# Patient Record
Sex: Male | Born: 1945
Health system: Southern US, Community
[De-identification: ages and names within clinical notes are randomized; demographics above are authoritative.]

## PROBLEM LIST (undated history)

## (undated) DIAGNOSIS — E785 Hyperlipidemia, unspecified: Secondary | ICD-10-CM

## (undated) DIAGNOSIS — I10 Essential (primary) hypertension: Secondary | ICD-10-CM

## (undated) DIAGNOSIS — R0602 Shortness of breath: Secondary | ICD-10-CM

## (undated) DIAGNOSIS — I5042 Chronic combined systolic (congestive) and diastolic (congestive) heart failure: Secondary | ICD-10-CM

## (undated) HISTORY — PX: DENTAL SURGERY: SHX609

---

## 2006-07-17 ENCOUNTER — Emergency Department (HOSPITAL_COMMUNITY): Admission: EM | Admit: 2006-07-17 | Discharge: 2006-07-17 | Payer: Self-pay | Admitting: Emergency Medicine

## 2010-05-21 ENCOUNTER — Ambulatory Visit (HOSPITAL_COMMUNITY): Admission: RE | Admit: 2010-05-21 | Discharge: 2010-05-21 | Payer: Self-pay | Admitting: Cardiology

## 2013-06-15 ENCOUNTER — Observation Stay (HOSPITAL_COMMUNITY)
Admission: EM | Admit: 2013-06-15 | Discharge: 2013-06-17 | Disposition: A | Discharge status: Home / Self Care | Payer: Medicare Other | Attending: Internal Medicine | Admitting: Internal Medicine

## 2013-06-15 ENCOUNTER — Emergency Department (HOSPITAL_COMMUNITY): Payer: Medicare Other

## 2013-06-15 ENCOUNTER — Encounter (HOSPITAL_COMMUNITY): Payer: Self-pay | Admitting: Emergency Medicine

## 2013-06-15 DIAGNOSIS — I5041 Acute combined systolic (congestive) and diastolic (congestive) heart failure: Secondary | ICD-10-CM

## 2013-06-15 DIAGNOSIS — N183 Chronic kidney disease, stage 3 unspecified: Secondary | ICD-10-CM | POA: Insufficient documentation

## 2013-06-15 DIAGNOSIS — R0602 Shortness of breath: Secondary | ICD-10-CM | POA: Diagnosis not present

## 2013-06-15 DIAGNOSIS — I5043 Acute on chronic combined systolic (congestive) and diastolic (congestive) heart failure: Secondary | ICD-10-CM | POA: Diagnosis not present

## 2013-06-15 DIAGNOSIS — I161 Hypertensive emergency: Secondary | ICD-10-CM

## 2013-06-15 DIAGNOSIS — D649 Anemia, unspecified: Secondary | ICD-10-CM | POA: Diagnosis not present

## 2013-06-15 DIAGNOSIS — N179 Acute kidney failure, unspecified: Secondary | ICD-10-CM | POA: Diagnosis not present

## 2013-06-15 DIAGNOSIS — I10 Essential (primary) hypertension: Secondary | ICD-10-CM | POA: Diagnosis not present

## 2013-06-15 DIAGNOSIS — I130 Hypertensive heart and chronic kidney disease with heart failure and stage 1 through stage 4 chronic kidney disease, or unspecified chronic kidney disease: Principal | ICD-10-CM | POA: Insufficient documentation

## 2013-06-15 DIAGNOSIS — I5042 Chronic combined systolic (congestive) and diastolic (congestive) heart failure: Secondary | ICD-10-CM

## 2013-06-15 DIAGNOSIS — J449 Chronic obstructive pulmonary disease, unspecified: Secondary | ICD-10-CM | POA: Diagnosis not present

## 2013-06-15 DIAGNOSIS — Z125 Encounter for screening for malignant neoplasm of prostate: Secondary | ICD-10-CM | POA: Diagnosis not present

## 2013-06-15 DIAGNOSIS — R079 Chest pain, unspecified: Secondary | ICD-10-CM

## 2013-06-15 DIAGNOSIS — Z9119 Patient's noncompliance with other medical treatment and regimen: Secondary | ICD-10-CM | POA: Insufficient documentation

## 2013-06-15 DIAGNOSIS — E785 Hyperlipidemia, unspecified: Secondary | ICD-10-CM | POA: Insufficient documentation

## 2013-06-15 DIAGNOSIS — R634 Abnormal weight loss: Secondary | ICD-10-CM | POA: Diagnosis not present

## 2013-06-15 DIAGNOSIS — F17200 Nicotine dependence, unspecified, uncomplicated: Secondary | ICD-10-CM | POA: Insufficient documentation

## 2013-06-15 DIAGNOSIS — I509 Heart failure, unspecified: Secondary | ICD-10-CM | POA: Insufficient documentation

## 2013-06-15 DIAGNOSIS — E782 Mixed hyperlipidemia: Secondary | ICD-10-CM | POA: Diagnosis not present

## 2013-06-15 DIAGNOSIS — Z79899 Other long term (current) drug therapy: Secondary | ICD-10-CM | POA: Diagnosis not present

## 2013-06-15 DIAGNOSIS — Z91199 Patient's noncompliance with other medical treatment and regimen due to unspecified reason: Secondary | ICD-10-CM | POA: Diagnosis not present

## 2013-06-15 DIAGNOSIS — R9431 Abnormal electrocardiogram [ECG] [EKG]: Secondary | ICD-10-CM | POA: Diagnosis present

## 2013-06-15 HISTORY — DX: Essential (primary) hypertension: I10

## 2013-06-15 HISTORY — DX: Shortness of breath: R06.02

## 2013-06-15 HISTORY — DX: Hyperlipidemia, unspecified: E78.5

## 2013-06-15 LAB — BASIC METABOLIC PANEL
BUN: 21 mg/dL (ref 6–23)
CO2: 23 mEq/L (ref 19–32)
Calcium: 9.6 mg/dL (ref 8.4–10.5)
Glucose, Bld: 87 mg/dL (ref 70–99)
Sodium: 138 mEq/L (ref 135–145)

## 2013-06-15 LAB — CBC WITH DIFFERENTIAL/PLATELET
Eosinophils Relative: 1 % (ref 0–5)
HCT: 33.7 % — ABNORMAL LOW (ref 39.0–52.0)
Hemoglobin: 11.5 g/dL — ABNORMAL LOW (ref 13.0–17.0)
Lymphocytes Relative: 30 % (ref 12–46)
Lymphs Abs: 2.2 10*3/uL (ref 0.7–4.0)
MCH: 29.6 pg (ref 26.0–34.0)
MCV: 86.9 fL (ref 78.0–100.0)
Monocytes Absolute: 0.6 10*3/uL (ref 0.1–1.0)
Monocytes Relative: 8 % (ref 3–12)
Platelets: 300 10*3/uL (ref 150–400)
RBC: 3.88 MIL/uL — ABNORMAL LOW (ref 4.22–5.81)
WBC: 7.5 10*3/uL (ref 4.0–10.5)

## 2013-06-15 LAB — URINALYSIS, ROUTINE W REFLEX MICROSCOPIC
Bilirubin Urine: NEGATIVE
Glucose, UA: NEGATIVE mg/dL
Leukocytes, UA: NEGATIVE
Nitrite: NEGATIVE
Specific Gravity, Urine: 1.014 (ref 1.005–1.030)
pH: 5.5 (ref 5.0–8.0)

## 2013-06-15 LAB — POCT I-STAT TROPONIN I

## 2013-06-15 LAB — URINE MICROSCOPIC-ADD ON

## 2013-06-15 MED ORDER — ALBUTEROL SULFATE HFA 108 (90 BASE) MCG/ACT IN AERS
2.0000 | INHALATION_SPRAY | Freq: Two times a day (BID) | RESPIRATORY_TRACT | Status: DC | PRN
  Filled 2013-06-15: qty 6.7

## 2013-06-15 MED ORDER — HEPARIN SODIUM (PORCINE) 5000 UNIT/ML IJ SOLN
5000.0000 [IU] | Freq: Three times a day (TID) | INTRAMUSCULAR | Status: DC
  Administered 2013-06-16 – 2013-06-17 (×5): 5000 [IU] via SUBCUTANEOUS
  Filled 2013-06-15 (×8): qty 1

## 2013-06-15 MED ORDER — ALBUTEROL SULFATE HFA 108 (90 BASE) MCG/ACT IN AERS
2.0000 | INHALATION_SPRAY | Freq: Four times a day (QID) | RESPIRATORY_TRACT | Status: DC
  Administered 2013-06-16: 01:00:00 2 via RESPIRATORY_TRACT
  Filled 2013-06-15: qty 6.7

## 2013-06-15 MED ORDER — ASPIRIN 81 MG PO CHEW
324.0000 mg | CHEWABLE_TABLET | Freq: Once | ORAL | Status: AC
  Administered 2013-06-15: 324 mg via ORAL
  Filled 2013-06-15: qty 4

## 2013-06-15 MED ORDER — METOPROLOL TARTRATE 50 MG PO TABS
50.0000 mg | ORAL_TABLET | Freq: Two times a day (BID) | ORAL | Status: DC
  Administered 2013-06-15 – 2013-06-17 (×4): 50 mg via ORAL
  Filled 2013-06-15 (×2): qty 1
  Filled 2013-06-15: qty 2
  Filled 2013-06-15 (×2): qty 1

## 2013-06-15 MED ORDER — HYDROCODONE-ACETAMINOPHEN 5-325 MG PO TABS: 1.0000 | ORAL_TABLET | ORAL | Status: DC | PRN

## 2013-06-15 MED ORDER — ACETAMINOPHEN 650 MG RE SUPP: 650.0000 mg | Freq: Four times a day (QID) | RECTAL | Status: DC | PRN

## 2013-06-15 MED ORDER — ONDANSETRON HCL 4 MG/2ML IJ SOLN: 4.0000 mg | Freq: Three times a day (TID) | INTRAMUSCULAR | Status: AC | PRN

## 2013-06-15 MED ORDER — LABETALOL HCL 5 MG/ML IV SOLN
20.0000 mg | Freq: Once | INTRAVENOUS | Status: AC
  Administered 2013-06-15: 20 mg via INTRAVENOUS
  Filled 2013-06-15: qty 4

## 2013-06-15 MED ORDER — ACETAMINOPHEN 325 MG PO TABS: 650.0000 mg | ORAL_TABLET | Freq: Four times a day (QID) | ORAL | Status: DC | PRN

## 2013-06-15 MED ORDER — HYDROCHLOROTHIAZIDE 25 MG PO TABS
25.0000 mg | ORAL_TABLET | Freq: Every day | ORAL | Status: DC
  Administered 2013-06-16: 11:00:00 25 mg via ORAL
  Filled 2013-06-15: qty 1

## 2013-06-15 MED ORDER — SODIUM CHLORIDE 0.9 % IJ SOLN
3.0000 mL | Freq: Two times a day (BID) | INTRAMUSCULAR | Status: DC
  Administered 2013-06-16 – 2013-06-17 (×4): 3 mL via INTRAVENOUS

## 2013-06-15 MED ORDER — ASPIRIN EC 81 MG PO TBEC
81.0000 mg | DELAYED_RELEASE_TABLET | Freq: Every day | ORAL | Status: DC
  Administered 2013-06-16 – 2013-06-17 (×2): 81 mg via ORAL
  Filled 2013-06-15 (×2): qty 1

## 2013-06-15 MED ORDER — HYDRALAZINE HCL 20 MG/ML IJ SOLN
10.0000 mg | INTRAMUSCULAR | Status: DC | PRN
  Administered 2013-06-15: 10 mg via INTRAVENOUS
  Filled 2013-06-15: qty 1

## 2013-06-15 MED ORDER — AMLODIPINE BESYLATE 2.5 MG PO TABS
2.5000 mg | ORAL_TABLET | Freq: Once | ORAL | Status: AC
  Administered 2013-06-16: 2.5 mg via ORAL
  Filled 2013-06-15: qty 1

## 2013-06-15 NOTE — Progress Notes (Signed)
Admitted pt from ED per stretcher, alert,oriented x 4, denies any chest pain, no headache, no nausea and vomiting, not in respiratory distress. VS taken and weighed. Put on telemetry. Oriented to room and call bell. Will continue to monitor pt

## 2013-06-15 NOTE — ED Notes (Signed)
Attempted report 

## 2013-06-15 NOTE — ED Notes (Signed)
Pt asks not to have blood work at this time because he had labs earlier today

## 2013-06-15 NOTE — ED Provider Notes (Addendum)
TIME SEEN: 6:20 PM  CHIEF COMPLAINT: Hypertension, chest pain, shortness of breath  HPI: Patient is a 67 year old male with a prior history of hypertension, hyperlipidemia, tobacco use who presents the emergency department for evaluation from his primary care physician's office for complaints of intermittent chest pain and shortness of breath as well as uncontrolled hypertension. Patient reports that he went to Dr. Joelene Millin Shelton's office [facility] for annual checkup. There he was extremely hypertensive and was sent to the emergency department. On review of systems, patient complains that he has had intermittent chest heaviness and shortness of breath with exertion. His last stress was many years ago. He denies a history of cardiac catheterization. No history of PE or DVT. He describes some dizziness and nausea with his episodes of chest pain but no diaphoresis. No fever or cough. He currently denies any chest pain but is having mild shortness of breath. No headache, blurry vision, numbness, tingling or focal weakness. He reports that he has been off of his blood pressure medication for 2 years because he started feeling well while he was on him and thought he did not need them anymore.  ROS: See HPI Constitutional: no fever  Eyes: no drainage  ENT: no runny nose   Cardiovascular:  chest pain  Resp: SOB  GI: no vomiting GU: no dysuria Integumentary: no rash  Allergy: no hives  Musculoskeletal: no leg swelling  Neurological: no slurred speech ROS otherwise negative  PAST MEDICAL HISTORY/PAST SURGICAL HISTORY:  Past Medical History  Diagnosis Date  . Hypertension     MEDICATIONS:  Prior to Admission medications   Medication Sig Start Date End Date Taking? Authorizing Provider  albuterol (PROVENTIL HFA;VENTOLIN HFA) 108 (90 BASE) MCG/ACT inhaler Inhale 2 puffs into the lungs 2 (two) times daily as needed for wheezing or shortness of breath.   Yes Historical  Provider, MD  hydrocortisone cream 1 % Apply 1 application topically daily. Apply to spots on face   Yes Historical Provider, MD  ibuprofen (ADVIL,MOTRIN) 200 MG tablet Take 200-400 mg by mouth daily as needed for pain.   Yes Historical Provider, MD    ALLERGIES:  No Known Allergies  SOCIAL HISTORY:  History  Substance Use Topics  . Smoking status: Current Every Day Smoker  . Smokeless tobacco: Not on file  . Alcohol Use: No    FAMILY HISTORY: History reviewed. No pertinent family history.  EXAM: BP 213/117  Pulse 105  Temp(Src) 98.2 F (36.8 C) (Oral)  Resp 16  SpO2 98% CONSTITUTIONAL: Alert and oriented and responds appropriately to questions. Well-appearing; well-nourished, no apparent distress, hypertensive HEAD: Normocephalic EYES: Conjunctivae clear, PERRL ENT: normal nose; no rhinorrhea; moist mucous membranes; pharynx without lesions noted NECK: Supple, no meningismus, no LAD  CARD: RRR; S1 and S2 appreciated; no murmurs, no clicks, no rubs, no gallops RESP: Normal chest excursion without splinting or tachypnea; breath sounds clear and equal bilaterally; no wheezes, no rhonchi, no rales,  ABD/GI: Normal bowel sounds; non-distended; soft, non-tender, no rebound, no guarding BACK:  The back appears normal and is non-tender to palpation, there is no CVA tenderness EXT: Normal ROM in all joints; non-tender to palpation; no edema; normal capillary refill; no cyanosis    SKIN: Normal color for age and race; warm NEURO: Moves all extremities equally, cranial nerves II through XII intact, sensation to light touch intact diffusely, normal gait, no pronator dressed PSYCH: The patient's mood and manner are appropriate. Grooming and personal hygiene are appropriate.  MEDICAL DECISION MAKING: Patient with severe hypertension, chest pain and shortness of breath. We'll thyroid for hypertensive emergency. Will give antihypertensives in the emergency department. We'll also give aspirin  given his history of chest pain. He is currently chest pain-free. We'll obtain cardiac labs, chest x-ray, urinalysis. Have recommended admission to the hospital.   Patient reports that he really does not want admission to the hospital. He states he is only here because his primary care physician instructed him to come to the emergency department for blood pressure control.   Date: 06/15/2013 8:22 PM  Rate: 85  Rhythm: normal sinus rhythm  QRS Axis: normal  Intervals: normal  ST/T Wave abnormalities: ST depression and T wave inversion in inferior and lateral leads  Conduction Disutrbances: none  Narrative Interpretation: Left atrial enlargement, LVH, ST depression and T-wave inversions in inferior and lateral leads, no old for comparison     ED PROGRESS:  Patient's blood pressure has improved with IV labetalol. Will start on oral metoprolol. His EKG is concerning for ischemic changes versus changes due to prolonged hypertension. Labs pending.  Troponin negative. Creatinine is slightly elevated at 1.32. Chest x-ray clear. Urinalysis pending.  Patient now agrees to being admitted to the hospital. Will discuss with hospitalist.  Blood pressures improved to 170s/100s. Do not want to acutely lower patient any further given concern for cerebral ischemia.  9:08PM  Discussed with Dr. Posey Pronto for admission.  BP improved and stable upon multiple re-evals.  Do not feel pt needs drip at this time.  Given oral meds after IV to control his BP.  [place], DO 06/15/13 2109  CRITICAL CARE Performed by: Nyra Jabs   Total critical care time: 60 minutes  Critical care time was exclusive of separately billable procedures and treating other patients.  Critical care was necessary to treat or prevent imminent or life-threatening deterioration.  Critical care was time spent personally by me on the following activities: development of treatment plan with patient and/or surrogate as well as nursing,  discussions with consultants, evaluation of patient's response to treatment, examination of patient, obtaining history from patient or surrogate, ordering and performing treatments and interventions, ordering and review of laboratory studies, ordering and review of radiographic studies, pulse oximetry and re-evaluation of patient's condition.  [place], DO 06/15/13 2333

## 2013-06-15 NOTE — ED Notes (Signed)
Delay expressed to 4E RN

## 2013-06-15 NOTE — ED Notes (Signed)
Pt sent here by PCP for htn; pt sts some exertional SOB; pt denies pain; pt sts hx of htn and not taking meds x 2 years

## 2013-06-16 DIAGNOSIS — I059 Rheumatic mitral valve disease, unspecified: Secondary | ICD-10-CM | POA: Diagnosis not present

## 2013-06-16 DIAGNOSIS — R079 Chest pain, unspecified: Secondary | ICD-10-CM | POA: Diagnosis not present

## 2013-06-16 DIAGNOSIS — R9431 Abnormal electrocardiogram [ECG] [EKG]: Secondary | ICD-10-CM | POA: Diagnosis present

## 2013-06-16 DIAGNOSIS — I10 Essential (primary) hypertension: Secondary | ICD-10-CM | POA: Diagnosis present

## 2013-06-16 DIAGNOSIS — I130 Hypertensive heart and chronic kidney disease with heart failure and stage 1 through stage 4 chronic kidney disease, or unspecified chronic kidney disease: Secondary | ICD-10-CM | POA: Diagnosis not present

## 2013-06-16 DIAGNOSIS — I5042 Chronic combined systolic (congestive) and diastolic (congestive) heart failure: Secondary | ICD-10-CM

## 2013-06-16 LAB — TROPONIN I
Troponin I: <0.3 ng/mL (ref <0.30)
Troponin I: <0.3 ng/mL (ref <0.30)
Troponin I: <0.3 ng/mL (ref <0.30)

## 2013-06-16 LAB — LIPID PANEL
HDL: 37 mg/dL — ABNORMAL LOW (ref >39)
LDL Cholesterol: 150 mg/dL — ABNORMAL HIGH (ref 0–99)
VLDL: 14 mg/dL (ref 0–40)

## 2013-06-16 LAB — CBC WITH DIFFERENTIAL/PLATELET
Basophils Absolute: 0 10*3/uL (ref 0.0–0.1)
HCT: 32.1 % — ABNORMAL LOW (ref 39.0–52.0)
Lymphocytes Relative: 42 % (ref 12–46)
Neutro Abs: 3.2 10*3/uL (ref 1.7–7.7)
Neutrophils Relative %: 46 % (ref 43–77)
Platelets: 307 10*3/uL (ref 150–400)
RDW: 14 % (ref 11.5–15.5)
WBC: 7 10*3/uL (ref 4.0–10.5)

## 2013-06-16 LAB — COMPREHENSIVE METABOLIC PANEL
ALT: 9 U/L (ref 0–53)
AST: 28 U/L (ref 0–37)
Albumin: 3.5 g/dL (ref 3.5–5.2)
CO2: 27 mEq/L (ref 19–32)
Chloride: 103 mEq/L (ref 96–112)
GFR calc non Af Amer: 49 mL/min — ABNORMAL LOW (ref >90)
Potassium: 4.4 mEq/L (ref 3.5–5.1)
Sodium: 141 mEq/L (ref 135–145)
Total Bilirubin: 0.2 mg/dL — ABNORMAL LOW (ref 0.3–1.2)

## 2013-06-16 LAB — HEMOGLOBIN A1C: Hgb A1c MFr Bld: 5.1 % (ref <5.7)

## 2013-06-16 LAB — GLUCOSE, CAPILLARY: Glucose-Capillary: 101 mg/dL — ABNORMAL HIGH (ref 70–99)

## 2013-06-16 MED ORDER — LISINOPRIL 5 MG PO TABS
5.0000 mg | ORAL_TABLET | Freq: Every day | ORAL | Status: DC
  Administered 2013-06-17: 11:00:00 5 mg via ORAL
  Filled 2013-06-16: qty 1

## 2013-06-16 MED ORDER — ALBUTEROL SULFATE HFA 108 (90 BASE) MCG/ACT IN AERS
2.0000 | INHALATION_SPRAY | Freq: Two times a day (BID) | RESPIRATORY_TRACT | Status: DC
  Administered 2013-06-16 – 2013-06-17 (×3): 2 via RESPIRATORY_TRACT
  Filled 2013-06-16: qty 6.7

## 2013-06-16 MED ORDER — FUROSEMIDE 10 MG/ML IJ SOLN
20.0000 mg | Freq: Every day | INTRAMUSCULAR | Status: DC
  Filled 2013-06-16: qty 2

## 2013-06-16 MED ORDER — ALBUTEROL SULFATE HFA 108 (90 BASE) MCG/ACT IN AERS
2.0000 | INHALATION_SPRAY | Freq: Four times a day (QID) | RESPIRATORY_TRACT | Status: DC | PRN
  Filled 2013-06-16: qty 6.7

## 2013-06-16 NOTE — H&P (Signed)
[facility] History and Physical  PatientJOSEJUAN [name]  [id number]  DOB: 21-Jan-1946  DOA: 06/15/2013  Referring physician: Dr. Leonides Schanz PCP: Pcp Not In System   Chief Complaint: High blood pressure  HPI: L Ronnie [name] is a 67 y.o. male with Past medical history of hypertension, active smoking, dyslipidemia. Patient has not been following with a physician and recently today went to see his physician. He was not on any medications. Patient was found with elevated blood pressure in the clinic and was sent to the hospital. In the ER patient also complained of some chest heaviness and shortness of breath which on my review resolved. Patient denies any prior history of coronary artery disease. He has chronic shortness of breath with dry cough for. He also complains of orthopnea and PND. Patient denies any focal neurological deficit.   Review of Systems: as mentioned in the history of present illness.  A Comprehensive review of the other systems is negative.  Past Medical History  Diagnosis Date  . Hypertension    History reviewed. No pertinent past surgical history. Social History:  reports that he has been smoking.  He does not have any smokeless tobacco history on file. He reports that he does not drink alcohol or use illicit drugs. Patient is coming from home. Independent for most of his  ADL.  No Known Allergies  History reviewed. No pertinent family history.  Prior to Admission medications   Medication Sig Start Date End Date Taking? Authorizing Provider  albuterol (PROVENTIL HFA;VENTOLIN HFA) 108 (90 BASE) MCG/ACT inhaler Inhale 2 puffs into the lungs 2 (two) times daily as needed for wheezing or shortness of breath.   Yes Historical Provider, MD  hydrocortisone cream 1 % Apply 1 application topically daily. Apply to spots on face   Yes Historical Provider, MD  ibuprofen (ADVIL,MOTRIN) 200 MG tablet Take 200-400 mg by mouth daily as needed for pain.   Yes Historical  Provider, MD    Physical Exam: Filed Vitals:   06/15/13 2145 06/15/13 2218 06/16/13 0029 06/16/13 0046  BP: 183/104 199/104 150/75   Pulse: 79 92    Temp:  97.3 F (36.3 C)    TempSrc:  Oral    Resp: 15 16    Height:  5\' 11"  (1.803 m)    Weight:  66.679 kg (147 lb)    SpO2: 97% 100%  100%    General: Alert, Awake and Oriented to Time, Place and Person. Appear in no  distress Eyes: PERRL ENT: Oral Mucosa clear moist. Neck: Minimal  JVD, no  Carotid Bruits  Cardiovascular: S1 and S2 Present, no Murmur, Peripheral Pulses Present Respiratory: Bilateral Air entry equal and Decreased, Clear to Auscultation,  No  Crackles,no  wheezes Abdomen: Bowel Sound Present, Soft and Non tender Skin: No  Rash Extremities: Trace  Pedal edema, no calf tenderness Neurologic: Grossly Unremarkable.  Labs on Admission:  CBC:  Recent Labs Lab 06/15/13 1919  WBC 7.5  NEUTROABS 4.6  HGB 11.5*  HCT 33.7*  MCV 86.9  PLT 300    CMP     Component Value Date/Time   NA 138 06/15/2013 1919   K 4.0 06/15/2013 1919   CL 102 06/15/2013 1919   CO2 23 06/15/2013 1919   GLUCOSE 87 06/15/2013 1919   BUN 21 06/15/2013 1919   CREATININE 1.32 06/15/2013 1919   CALCIUM 9.6 06/15/2013 1919   GFRNONAA 54* 06/15/2013 1919   GFRAA 63* 06/15/2013 1919    No results found for  this basename: LIPASE, AMYLASE,  in the last 168 hours No results found for this basename: AMMONIA,  in the last 168 hours  Cardiac Enzymes:  Recent Labs Lab 06/16/13 0019  TROPONINI <0.30    BNP (last 3 results) No results found for this basename: PROBNP,  in the last 8760 hours  Radiological Exams on Admission: Dg Chest 2 View  06/15/2013   *RADIOLOGY REPORT*  Clinical Data: Chest pain and shortness of breath  CHEST - 2 VIEW  Comparison: None.  Findings: The heart size and mediastinal contours are within normal limits. Both lungs are clear.  The visualized skeletal structures are unremarkable.  IMPRESSION: Negative exam.   Original Report  Authenticated By: Kerby Moors, M.D.    EKG: Independently reviewed. T wave inversions in inferior and lateral leads no prior EKG to compare, no signs of acute ischemia.  Assessment/Plan Principal Problem:   Accelerated hypertension Active Problems:   Chest pain   T wave inversion in EKG   1. Accelerated hypertension Patient initially presented with elevated blood pressure which improved with one dose of IV labetalol. Patient already given metoprolol in the ER. Currently I would continue him on the metoprolol, continue when necessary hydralazine for SBP more than 180, give him one dose of Norvasc. He may benefit from Imdur hydralazine combination considering his ckd and possible LVH. Although he has orthopnea he does not appear in decompensated heart failure and appear euvolemic.  2.Chest pain  Patient is currently chest pain-free although he has T wave inversions he does not have any signs of acute ischemia on EKG ,  Initial troponin is negative. Follow serial troponins and echocardiogram in the morning. Monitor on telemetry. Lipid profile and HbA1c in the morning.  DVT Prophylaxis: subcutaneous Heparin Nutrition: cardiac diet  Code Status: full    Author: Berle Mull, MD [facility] Pager: [phone] 06/16/2013, 4:04 AM    If 7PM-7AM, please contact night-coverage [email or web address] Password [email or web address]

## 2013-06-16 NOTE — Progress Notes (Signed)
  Echocardiogram 2D Echocardiogram has been performed.  Itali Mckendry 06/16/2013, 2:09 PM

## 2013-06-16 NOTE — Progress Notes (Signed)
Utilization Review Completed.   Nichlos Kunzler, RN, BSN Nurse Case Manager  [phone]  

## 2013-06-16 NOTE — Progress Notes (Signed)
[facility] PROGRESS NOTE  Ronnie [name] [id number] DOB: [birth year] DOA: 06/15/2013 PCP: Pcp Not In System  Assessment/Plan  Accelerated hypertension, blood pressures trended down overnight.  -  Continue metoprolol and norvasc -  Continue prn hydralazine  2.Chest pain, resolved.  Has T-wave inversions he does not have any signs of acute ischemia on EKG -  Troponin neg -  a1c -  Lipid -  ECHO   CKD stage 3, likely due to hypertension  Normocytic anemia, likely due to chornic disease -  Per PCP  Diet:  Healthy heart Access:  PIV IVF:  OFF Proph:  heparin  Code Status: full Family Communication: patietn alone Disposition Plan: possibly home this afternoon if bp stable and ECHO report done   Consultants:  none  Procedures:  ECHO  Antibiotics:  none   HPI/Subjective:  Patient states that he feels better and is asking when he can go home.  Denies CP, SOB, N/V/D.  Has chronic problems with positional HA and blurry vision.      Objective: Filed Vitals:   06/16/13 0029 06/16/13 0046 06/16/13 0501 06/16/13 0833  BP: 150/75  138/77   Pulse:   73   Temp:   98.1 F (36.7 C)   TempSrc:   Oral   Resp:   18   Height:      Weight:   66.5 kg (146 lb 9.7 oz)   SpO2:  100% 100% 98%    Intake/Output Summary (Last 24 hours) at 06/16/13 0933 Last data filed at 06/16/13 0659  Gross per 24 hour  Intake    360 ml  Output    625 ml  Net   -265 ml   Filed Weights   06/15/13 2218 06/16/13 0501  Weight: 66.679 kg (147 lb) 66.5 kg (146 lb 9.7 oz)    Exam:   General:  AAM, No acute distress  HEENT:  NCAT, MMM  Cardiovascular:  RRR, nl S1, S2 no mrg, 2+ pulses, warm extremities  Respiratory:  CTAB, no increased WOB  Abdomen:   NABS, soft, NT/ND  MSK:   Normal tone and bulk, no LEE  Neuro:  Grossly intact  Data Reviewed: Basic Metabolic Panel:  Recent Labs Lab 06/15/13 1919 06/16/13 0712  NA 138 141  K 4.0 4.4  CL 102 103  CO2 23 27   GLUCOSE 87 82  BUN 21 21  CREATININE 1.32 1.43*  CALCIUM 9.6 9.6   Liver Function Tests:  Recent Labs Lab 06/16/13 0712  AST 28  ALT 9  ALKPHOS 84  BILITOT 0.2*  PROT 7.8  ALBUMIN 3.5   No results found for this basename: LIPASE, AMYLASE,  in the last 168 hours No results found for this basename: AMMONIA,  in the last 168 hours CBC:  Recent Labs Lab 06/15/13 1919 06/16/13 0712  WBC 7.5 7.0  NEUTROABS 4.6 3.2  HGB 11.5* 11.1*  HCT 33.7* 32.1*  MCV 86.9 87.0  PLT 300 307   Cardiac Enzymes:  Recent Labs Lab 06/16/13 0019 06/16/13 0500  TROPONINI <0.30 <0.30   BNP (last 3 results) No results found for this basename: PROBNP,  in the last 8760 hours CBG:  Recent Labs Lab 06/16/13 0635  GLUCAP 102*    No results found for this or any previous visit (from the past 240 hour(s)).   Studies: Dg Chest 2 View  06/15/2013   *RADIOLOGY REPORT*  Clinical Data: Chest pain and shortness of breath  CHEST - 2 VIEW  Comparison:  None.  Findings: The heart size and mediastinal contours are within normal limits. Both lungs are clear.  The visualized skeletal structures are unremarkable.  IMPRESSION: Negative exam.   Original Report Authenticated By: Kerby Moors, M.D.    Scheduled Meds: . albuterol  2 puff Inhalation BID  . aspirin EC  81 mg Oral Daily  . heparin  5,000 Units Subcutaneous Q8H  . hydrochlorothiazide  25 mg Oral Daily  . metoprolol tartrate  50 mg Oral BID  . sodium chloride  3 mL Intravenous Q12H   Continuous Infusions:   Principal Problem:   Accelerated hypertension Active Problems:   Chest pain   T wave inversion in EKG    Time spent: 30 min    Thang Flett, [place] Pager [phone]. If 7PM-7AM, please contact night-coverage at [email or web address], password [facility] 06/16/2013, 9:33 AM  LOS: 1 day

## 2013-06-17 ENCOUNTER — Encounter (HOSPITAL_COMMUNITY): Payer: Self-pay | Admitting: General Practice

## 2013-06-17 DIAGNOSIS — R079 Chest pain, unspecified: Secondary | ICD-10-CM | POA: Diagnosis not present

## 2013-06-17 DIAGNOSIS — I5041 Acute combined systolic (congestive) and diastolic (congestive) heart failure: Secondary | ICD-10-CM | POA: Diagnosis not present

## 2013-06-17 DIAGNOSIS — E785 Hyperlipidemia, unspecified: Secondary | ICD-10-CM

## 2013-06-17 DIAGNOSIS — I10 Essential (primary) hypertension: Secondary | ICD-10-CM

## 2013-06-17 DIAGNOSIS — R9431 Abnormal electrocardiogram [ECG] [EKG]: Secondary | ICD-10-CM

## 2013-06-17 DIAGNOSIS — I130 Hypertensive heart and chronic kidney disease with heart failure and stage 1 through stage 4 chronic kidney disease, or unspecified chronic kidney disease: Secondary | ICD-10-CM | POA: Diagnosis not present

## 2013-06-17 LAB — FERRITIN: Ferritin: 125 ng/mL (ref 22–322)

## 2013-06-17 LAB — TSH: TSH: 1.172 u[IU]/mL (ref 0.350–4.500)

## 2013-06-17 LAB — IRON AND TIBC
Iron: 75 ug/dL (ref 42–135)
TIBC: 311 ug/dL (ref 215–435)
UIBC: 236 ug/dL (ref 125–400)

## 2013-06-17 MED ORDER — CARVEDILOL 6.25 MG PO TABS: 6.2500 mg | ORAL_TABLET | Freq: Two times a day (BID) | ORAL | Status: DC

## 2013-06-17 MED ORDER — LISINOPRIL-HYDROCHLOROTHIAZIDE 20-12.5 MG PO TABS: 1.0000 | ORAL_TABLET | Freq: Every day | ORAL | Status: DC

## 2013-06-17 MED ORDER — CARVEDILOL 6.25 MG PO TABS
6.2500 mg | ORAL_TABLET | Freq: Two times a day (BID) | ORAL | Status: DC
  Filled 2013-06-17 (×2): qty 1

## 2013-06-17 MED ORDER — ASPIRIN 81 MG PO TBEC: 81.0000 mg | DELAYED_RELEASE_TABLET | Freq: Every day | ORAL | Status: AC

## 2013-06-17 MED ORDER — FUROSEMIDE 20 MG PO TABS
10.0000 mg | ORAL_TABLET | Freq: Every day | ORAL | Status: DC
  Administered 2013-06-17: 11:00:00 10 mg via ORAL
  Filled 2013-06-17: qty 0.5

## 2013-06-17 MED ORDER — INFLUENZA VAC SPLIT QUAD 0.5 ML IM SUSP: 0.5000 mL | INTRAMUSCULAR | Status: DC

## 2013-06-17 MED ORDER — SIMVASTATIN 40 MG PO TABS: 40.0000 mg | ORAL_TABLET | Freq: Every evening | ORAL | Status: DC

## 2013-06-17 MED ORDER — NICOTINE 21 MG/24HR TD PT24: 1.0000 | MEDICATED_PATCH | TRANSDERMAL | Status: DC

## 2013-06-17 MED ORDER — NICOTINE 21 MG/24HR TD PT24
21.0000 mg | MEDICATED_PATCH | Freq: Every day | TRANSDERMAL | Status: DC
  Filled 2013-06-17: qty 1

## 2013-06-17 NOTE — Plan of Care (Signed)
Problem: Food- and Nutrition-Related Knowledge Deficit (NB-1.1) Goal: Nutrition education Formal process to instruct or train a patient/client in a skill or to impart knowledge to help patients/clients voluntarily manage or modify food choices and eating behavior to maintain or improve health. Outcome: Completed/Met Date Met:  06/17/13 Nutrition Education Note  RD consulted for nutrition education regarding new onset CHF.  RD provided "Low Sodium Nutrition Therapy" handout from the [employer]. Reviewed patient's dietary recall. Provided examples on ways to decrease sodium intake in diet. Discouraged intake of processed foods and use of salt shaker. Encouraged fresh fruits and vegetables as well as whole grain sources of carbohydrates to maximize fiber intake.   RD discussed why it is important for patient to adhere to diet recommendations, and emphasized the role of fluids, foods to avoid, and importance of weighing self daily. Teach back method used.  Expect good compliance.  Body mass index is 20.82 kg/(m^2). Pt meets criteria for WNL based on current BMI.  Current diet order is Heart Healthy, patient is consuming approximately 50% of meals at this time. Labs and medications reviewed. No further nutrition interventions warranted at this time. RD contact information provided. If additional nutrition issues arise, please re-consult RD.   Inda Coke MS, RD, LDN Pager: [phone] After-hours pager: [phone]

## 2013-06-17 NOTE — Consult Note (Signed)
Advanced Heart Failure Team Consult Note  Referring Physician: Dr Posey Pronto  Primary Physician: Dr Jac Canavan Primary Cardiologist:  None  Reason for Consultation: New onset HF  HPI:    Mr Krupa is referred to HF team by Dr Posey Pronto for acute heart failure.  Mr Dilmore is a 67 year old with a history HTN, current smoker 1/2 pack per day, hyperlipidemia. He has had treadmill in past but not sure what it showed. He has 2 brothers and sister with heart disease. Prior to admit he was not taking any medications. Says he has been on medications for HTN in the past but hasn't taken them in over a year. No alcohol   Denies h/o known heart disease. Had negative stress test with Dr. Elisabeth Cara in [place] several years ago.   Complains of dyspnea with exertion and going up steps which has progressed over the last few months. Takes frequent rest breaks walking in the grocery store.   He presented [facility] EF with chest heaviness and elevated HTN 199/102.  Received 1 dose of labetolol and metoprolol. ECHO EF 123456 Grade II diastolic heart failure. Denies exertional CP, orthopnea, PND or edema.   Pertinent admit labs included: CEs negative, Potassium 4.4, cholesterol 201,LDL 150,  and creatinine 1.3.    Review of Systems: [y] = yes, [ ]  = no   General: Weight gain [ ] ; Weight loss [ ] ; Anorexia [ ] ; Fatigue [ Y]; Fever [ ] ; Chills [ ] ; Weakness [ ]   Cardiac: Chest pain/pressure [ ] ; Resting SOB [ ] ; Exertional SOB [Y ]; Orthopnea [Y ]; Pedal Edema [ ] ; Palpitations [ ] ; Syncope [ ] ; Presyncope [ ] ; Paroxysmal nocturnal dyspnea[ ]   Pulmonary: Cough [ ] ; Wheezing[ ] ; Hemoptysis[ ] ; Sputum [ ] ; Snoring [ ]   GI: Vomiting[ ] ; Dysphagia[ ] ; Melena[ ] ; Hematochezia [ ] ; Heartburn[ ] ; Abdominal pain [ ] ; Constipation [ ] ; Diarrhea [ ] ; BRBPR [ ]   GU: Hematuria[ ] ; Dysuria [ ] ; Nocturia[ ]   Vascular: Pain in legs with walking [ ] ; Pain in feet with lying flat [ ] ; Non-healing sores [ ] ; Stroke [ ] ; TIA [ ] ; Slurred  speech [ ] ;  Neuro: Headaches[ ] ; Vertigo[ ] ; Seizures[ ] ; Paresthesias[ ] ;Blurred vision [ ] ; Diplopia [ ] ; Vision changes [ ]   Ortho/Skin: Arthritis [ ] ; Joint pain [ ] ; Muscle pain [ ] ; Joint swelling [ ] ; Back Pain [ ] ; Rash [ ]   Psych: Depression[ ] ; Anxiety[ ]   Heme: Bleeding problems [ ] ; Clotting disorders [ ] ; Anemia [ ]   Endocrine: Diabetes [ ] ; Thyroid dysfunction[ ]   Home Medications Prior to Admission medications   Medication Sig Start Date End Date Taking? Authorizing Provider  albuterol (PROVENTIL HFA;VENTOLIN HFA) 108 (90 BASE) MCG/ACT inhaler Inhale 2 puffs into the lungs 2 (two) times daily as needed for wheezing or shortness of breath.   Yes Historical Provider, MD  hydrocortisone cream 1 % Apply 1 application topically daily. Apply to spots on face   Yes Historical Provider, MD  ibuprofen (ADVIL,MOTRIN) 200 MG tablet Take 200-400 mg by mouth daily as needed for pain.   Yes Historical Provider, MD    Past Medical History: Past Medical History  Diagnosis Date  . Hypertension   . Dyslipidemia   . Shortness of breath     Past Surgical History: Past Surgical History  Procedure Laterality Date  . Dental surgery      Family History: History reviewed. No pertinent family history.  Social History: History   Social  History  . Marital Status: Married    Spouse Name: N/A    Number of Children: N/A  . Years of Education: N/A   Social History Main Topics  . Smoking status: Current Every Day Smoker -- 0.50 packs/day for 50 years    Types: Cigarettes  . Smokeless tobacco: Never Used  . Alcohol Use: No  . Drug Use: No  . Sexual Activity: None   Other Topics Concern  . None   Social History Narrative  . None    Allergies:  No Known Allergies  Objective:    Vital Signs:   Temp:  [98 F (36.7 C)] 98 F (36.7 C) (09/11 1031) Pulse Rate:  [76] 76 (09/11 1031) Resp:  [18] 18 (09/11 1031) BP: (136-152)/(69-98) 152/98 mmHg (09/11 1031) SpO2:  [97 %-100  %] 100 % (09/11 1031) Weight:  [149 lb 3.2 oz (67.677 kg)] 149 lb 3.2 oz (67.677 kg) (09/11 0515) Last BM Date: 06/14/13  Weight change: Filed Weights   06/15/13 2218 06/16/13 0501 06/17/13 0515  Weight: 147 lb (66.679 kg) 146 lb 9.7 oz (66.5 kg) 149 lb 3.2 oz (67.677 kg)    Intake/Output:   Intake/Output Summary (Last 24 hours) at 06/17/13 1123 Last data filed at 06/17/13 0900  Gross per 24 hour  Intake    723 ml  Output    300 ml  Net    423 ml     Physical Exam: General:  Well appearing. No resp difficulty HEENT: normal Neck: supple. JVP 5-6 . Carotids 2+ bilat; no bruits. No lymphadenopathy or thryomegaly appreciated. Cor: PMI nondisplaced. Regular rate & rhythm. No rubs,  murmurs. +s4 Lungs: clear Abdomen: soft, nontender, nondistended. No hepatosplenomegaly. No bruits or masses. Good bowel sounds. Extremities: no cyanosis, clubbing, rash, edema Neuro: alert & orientedx3, cranial nerves grossly intact. moves all 4 extremities w/o difficulty. Affect pleasant  Telemetry: SR 70s  Labs: Basic Metabolic Panel:  Recent Labs Lab 06/15/13 1919 06/16/13 0712  NA 138 141  K 4.0 4.4  CL 102 103  CO2 23 27  GLUCOSE 87 82  BUN 21 21  CREATININE 1.32 1.43*  CALCIUM 9.6 9.6    Liver Function Tests:  Recent Labs Lab 06/16/13 0712  AST 28  ALT 9  ALKPHOS 84  BILITOT 0.2*  PROT 7.8  ALBUMIN 3.5   No results found for this basename: LIPASE, AMYLASE,  in the last 168 hours No results found for this basename: AMMONIA,  in the last 168 hours  CBC:  Recent Labs Lab 06/15/13 1919 06/16/13 0712  WBC 7.5 7.0  NEUTROABS 4.6 3.2  HGB 11.5* 11.1*  HCT 33.7* 32.1*  MCV 86.9 87.0  PLT 300 307    Cardiac Enzymes:  Recent Labs Lab 06/16/13 0019 06/16/13 0500 06/16/13 1140  TROPONINI <0.30 <0.30 <0.30    BNP: BNP (last 3 results) No results found for this basename: PROBNP,  in the last 8760 hours  CBG:  Recent Labs Lab 06/16/13 0635 06/16/13 1112  06/16/13 1647 06/16/13 2134 06/17/13 0930  GLUCAP 102* 101* 98 122* 124*    Coagulation Studies: No results found for this basename: LABPROT, INR,  in the last 72 hours  Other results: EKG: NSR 85 LVH with diffuse repol changes  Imaging: Dg Chest 2 View  06/15/2013   *RADIOLOGY REPORT*  Clinical Data: Chest pain and shortness of breath  CHEST - 2 VIEW  Comparison: None.  Findings: The heart size and mediastinal contours are within normal limits. Both  lungs are clear.  The visualized skeletal structures are unremarkable.  IMPRESSION: Negative exam.   Original Report Authenticated By: Kerby Moors, M.D.      Medications:     Current Medications: . albuterol  2 puff Inhalation BID  . aspirin EC  81 mg Oral Daily  . furosemide  10 mg Oral Daily  . heparin  5,000 Units Subcutaneous Q8H  . [START ON 06/18/2013] influenza vac split quadrivalent PF  0.5 mL Intramuscular Tomorrow-1000  . lisinopril  5 mg Oral Daily  . metoprolol tartrate  50 mg Oral BID  . sodium chloride  3 mL Intravenous Q12H     Infusions:      Assessment:  1. Acute combinedsystolic/diastolic [id number] ECHO EF 123456 Grade II diastolic HF. Never had cath 2. Uncontrolled HTN- admit BP 199/104  3. Hypertensive heart disease 4. CKD 5. Dyslipidemia 6. Current Smoker 1/2 PPD 7. Medication noncompliance    Plan/Discussion:    Mr Deerwester is 71 year AA male with new onset acute combined heart failure. Uncontrolled HTN due to medication noncompliance is  playing a major role.  Ideally need to evaluate coronaries because he has a strong family history of heart disease (2 brothers and 1 sister), current smoker, and elevated cholesterol. He is adamant that he does not want a cath. He is adamant he is going home today.    Volume status is stable.  Increase lasix to 20 mg daily at discharge. Can add spironolactone in outpatient setting if he follows up. Switch metoprolol to carvedilol 6.25 mg twice a day. Continue  lisinopril 5 mg daily and increase to bid if creatinine ok. Check TSH  Cholesterol 201 LDL 150. Add simvastain 40 mg daily. Will need PCP to check lipids in 3 months.   Provided education on daily weights, medication compliance, and low salt food choices. If he stays will need to consult dietitian and cardiac rehab.    Length of Stay: 2  CLEGG,AMY 06/17/2013, 11:23 AM  Advanced Heart Failure Team Pager [phone] (M-F; 7a - 4p)  Please contact [facility] for night-coverage after hours (4p -7a ) and weekends on [email or web address]  Patient seen and examined with Darrick Grinder, NP. We discussed all aspects of the encounter. I agree with the assessment and plan as stated above.   Main issue appears to be hypertensive heart disease. EF is moderately reduced. Volume status looks good. Given CRFs we discussed possibility of underlying CAD but he adamantly refused cath.  I think he is ok for d/c today with close f/u on BP. Agree with carvedilol 6.25 bid and  lisinopril/HCTZ 20/12.5. Can stop lasix. Ideally would use spiro but not sure he would be compliant with f/u. Needs to f/u with PCP next week.   Will need Myoview at some point to further risk stratify for CAD. Can f/u in HF Clinic with Tom Wall in 1-2 weeks.   Stressed need to be complaint with meds.   Benay Spice 12:35 PM

## 2013-06-17 NOTE — Progress Notes (Signed)
Patient's IV and telemetry has been discontinued; patient verbalizes understanding of discharge instructions and is being discharged home.  Ruben Im RN

## 2013-06-17 NOTE — Discharge Summary (Signed)
Physician Discharge Summary  Ronnie [name] KA:[id number] DOB: Aug 10, 1946 DOA: 06/15/2013  PCP: Salena Saner., MD  Admit date: 06/15/2013 Discharge date: 06/17/2013  Recommendations for Outpatient Follow-up:  1. Follow up with primary care doctor within one to 2 weeks of discharge.  Blood pressure check.  Follow up TSH, iron studies, folate, B12, SPEP/IFE which are pending at the time of discharge.  Continue smoking cessation counseling. 2. Started on lisinopril-HCTZ, carvedilol, ASA 81mg , simvastatin 3. Followup with cardiology and already scheduled appointment in 2 weeks. Evaluation for ischemia.  Followup TSH.  Initiation of spironolactone if blood pressure tolerates.    Discharge Diagnoses:  Principal Problem:   Accelerated hypertension Active Problems:   Chest pain   T wave inversion in EKG   Chronic combined systolic and diastolic CHF (congestive heart failure)   Acute combined systolic and diastolic heart failure   Discharge Condition: Stable, improved  Diet recommendation: 2 g sodium  Wt Readings from Last 3 Encounters:  06/17/13 67.677 kg (149 lb 3.2 oz)    History of present illness:   Ronnie [name] is a 67 y.o. male with Past medical history of hypertension, active smoking, dyslipidemia.  Patient has not been following with a physician and recently today went to see his physician. He was not on any medications. Patient was found with elevated blood pressure in the clinic and was sent to the hospital.  In the ER patient also complained of some chest heaviness and shortness of breath which on my review resolved.  Patient denies any prior history of coronary artery disease.  He has chronic shortness of breath with dry cough for.  He also complains of orthopnea and PND.  Patient denies any focal neurological deficit.   Hospital Course:   Ronnie [name] was admitted with hypertensive urgency.  He did not have headache or blurry vision so CT head was not completed.   EKG was concerning for inferior and lateral T-wave inversions.  Troponins were negative.  He was given hydralazine 10 mg IV, labetalol 20 mg IV, and metoprolol 50 mg tablet once in the emergency department.  Blood pressure trended down from 213/117 to 0000000 to [id number] systolic over [id number] diastolic.  Echocardiogram demonstrated diffuse hypokinesis of the left ventricle with an ejection fraction of 35-40%, moderate mitral valve regurgitation.  There were no wall motion abnormalities, however the patient has risk factors of high blood pressure, high cholesterol, ongoing smoking.  He was seen by cardiology who recommended an outpatient evaluation for ischemia such as stress test or cardiac catheterization. These tests were offered to the patient during his hospitalization, however he declined and asked to go home.  He was started on lisinopril-hydrochlorothiazide, carvedilol, aspirin, and simvastatin to reduce his cardiovascular risk.  He was also given a nicotine patch and smoking cessation counseling.  He notes a nutritionist to discuss low-sodium diet. He had education by nursing staff regarding heart failure and the importance of low-salt diet, daily weights, smoking cessation, and medication compliance.    Uncontrolled HTN, blood pressure still elevated.  Started on lisinopril-HCTZ 20-12.5mg , carvedilol 6.25mg  BID.  Blood pressure check in one week. Please repeat BMP in one week to assess kidney function after starting lisinopril 20 mg daily.    Acute systolic and diastolic heart failure, ejection fraction 123456, grade 2 diastolic dysfunction.  Likely due to long-standing uncontrolled hypertension, however, rule out ischemia. -  On ACE inhibitor, beta blocker, aspirin.   -  Started on HCTZ for diuresis and blood pressure control -  Addition of hydralazine/Imdur and/or spironolactone as an outpatient  Hyperlipidemia, started on simvastatin 40mg  daily.    CKD stage III, likely secondary to uncontrolled HTN.   A1c wnl.  Check SPEP/IFE as outpatient to rule out MM.  Mild normocytic anemia.  TSH, B12, folate, ferritin, iron, SPEP  Procedures:  ECHO  Consultations:  Cardiology, Dr. Haroldine Laws  Discharge Exam: Filed Vitals:   06/17/13 1031  BP: 152/98  Pulse: 76  Temp: 98 F (36.7 C)  Resp: 18   Filed Vitals:   06/16/13 2001 06/17/13 0515 06/17/13 1027 06/17/13 1031  BP: 136/69 148/76  152/98  Pulse: 76 76  76  Temp: 98 F (36.7 C) 98 F (36.7 C)  98 F (36.7 C)  TempSrc: Oral Oral  Oral  Resp: 18 18  18   Height:      Weight:  67.677 kg (149 lb 3.2 oz)    SpO2: 97% 98% 99% 100%    General: AAM, No acute distress  HEENT: NCAT, MMM  Cardiovascular: RRR, nl S1, S2, no murmurs, 2+ pulses, warm extremities  Respiratory: CTAB, no increased WOB  Abdomen: NABS, soft, NT/ND  MSK: Normal tone and bulk, no LEE  Neuro: Grossly intact   Discharge Instructions  Discharge Orders   Future Appointments Provider Department Dept Phone   07/01/2013 3:40 PM [place] [phone]   Future Orders Complete By Expires   (HEART FAILURE PATIENTS) Call MD:  Anytime you have any of the following symptoms: 1) 3 pound weight gain in 24 hours or 5 pounds in 1 week 2) shortness of breath, with or without a dry hacking cough 3) swelling in the hands, feet or stomach 4) if you have to sleep on extra pillows at night in order to breathe.  As directed    Call MD for:  difficulty breathing, headache or visual disturbances  As directed    Call MD for:  extreme fatigue  As directed    Call MD for:  hives  As directed    Call MD for:  persistant dizziness or light-headedness  As directed    Call MD for:  persistant nausea and vomiting  As directed    Call MD for:  severe uncontrolled pain  As directed    Call MD for:  temperature >100.4  As directed    Diet - low sodium heart healthy  As directed    Discharge instructions  As directed    Comments:      You were hospitalized with high blood pressure.  You were found to have heart failure and will require further testing to make sure you do not have coronary artery disease (heart disease) which may have caused you to have heart failure.  Further testing can be performed as an outpatient.  Please eat a low salt diet (no more than 2gm per day).  Weigh yourself daily and call the cardiology office if you gain more than 3-lbs in one day or 5-lbs in one week.  Please set aside your old medications.  Please set up your new medications:  Carvedilol, simvastatin, aspirin 81mg , and combination pill HCTZ-lisinopril.  You have an appointment scheduled with cardiology on September 25th.   Increase activity slowly  As directed        Medication List    STOP taking these medications       ibuprofen 200 MG tablet  Commonly known as:  ADVIL,MOTRIN      TAKE these  medications       albuterol 108 (90 BASE) MCG/ACT inhaler  Commonly known as:  PROVENTIL HFA;VENTOLIN HFA  Inhale 2 puffs into the lungs 2 (two) times daily as needed for wheezing or shortness of breath.     aspirin 81 MG EC tablet  Take 1 tablet (81 mg total) by mouth daily.     carvedilol 6.25 MG tablet  Commonly known as:  COREG  Take 1 tablet (6.25 mg total) by mouth 2 (two) times daily with a meal.     hydrocortisone cream 1 %  Apply 1 application topically daily. Apply to spots on face     lisinopril-hydrochlorothiazide 20-12.5 MG per tablet  Commonly known as:  PRINZIDE,ZESTORETIC  Take 1 tablet by mouth daily.     nicotine 21 mg/24hr patch  Commonly known as:  NICODERM CQ - dosed in mg/24 hours  Place 1 patch onto the skin daily.     simvastatin 40 MG tablet  Commonly known as:  ZOCOR  Take 1 tablet (40 mg total) by mouth every evening.           Follow-up Information   Follow up with Glori Bickers, MD On 07/01/2013. (3:40PM  Garage Code [id number])    Specialty:  Cardiology   Contact information:   [address] [place] [address] [phone]       Follow up with Salena Saner., MD. Schedule an appointment as soon as possible for a visit in 2 weeks.   Specialty:  Internal Medicine   Contact information:   [address] [place] [address] [phone]       The results of significant diagnostics from this hospitalization (including imaging, microbiology, ancillary and laboratory) are listed below for reference.    Significant Diagnostic Studies: Dg Chest 2 View  06/15/2013   *RADIOLOGY REPORT*  Clinical Data: Chest pain and shortness of breath  CHEST - 2 VIEW  Comparison: None.  Findings: The heart size and mediastinal contours are within normal limits. Both lungs are clear.  The visualized skeletal structures are unremarkable.  IMPRESSION: Negative exam.   Original Report Authenticated By: Kerby Moors, M.D.    Microbiology: No results found for this or any previous visit (from the past 240 hour(s)).   Labs: Basic Metabolic Panel:  Recent Labs Lab 06/15/13 1919 06/16/13 0712  NA 138 141  K 4.0 4.4  CL 102 103  CO2 23 27  GLUCOSE 87 82  BUN 21 21  CREATININE 1.32 1.43*  CALCIUM 9.6 9.6   Liver Function Tests:  Recent Labs Lab 06/16/13 0712  AST 28  ALT 9  ALKPHOS 84  BILITOT 0.2*  PROT 7.8  ALBUMIN 3.5   No results found for this basename: LIPASE, AMYLASE,  in the last 168 hours No results found for this basename: AMMONIA,  in the last 168 hours CBC:  Recent Labs Lab 06/15/13 1919 06/16/13 0712  WBC 7.5 7.0  NEUTROABS 4.6 3.2  HGB 11.5* 11.1*  HCT 33.7* 32.1*  MCV 86.9 87.0  PLT 300 307   Cardiac Enzymes:  Recent Labs Lab 06/16/13 0019 06/16/13 0500 06/16/13 1140  TROPONINI <0.30 <0.30 <0.30   BNP: BNP (last 3 results) No results found for this basename: PROBNP,  in the last 8760 hours CBG:  Recent Labs Lab 06/16/13 0635 06/16/13 1112 06/16/13 1647 06/16/13 2134 06/17/13 0930  GLUCAP 102* 101* 98  122* 124*   Lab Results  Component Value Date  CHOL 201* 06/16/2013   HDL 37* 06/16/2013   LDLCALC 150* 06/16/2013   TRIG 71 06/16/2013   CHOLHDL 5.4 06/16/2013   Lab Results  Component Value Date   HGBA1C 5.1 06/16/2013     Time coordinating discharge: 45 minutes  Signed:  [name]  [facility] 06/17/2013, 1:44 PM

## 2013-06-17 NOTE — Progress Notes (Signed)
Pt. Resting quietly in bed. No s/s of distress or discomfort noted. Pt. Denies pain. Call light within reach. RN will continue to monitor pt. Ronnie [name], [name]

## 2013-06-21 LAB — PROTEIN ELECTROPHORESIS, SERUM
Albumin ELP: 49.7 % — ABNORMAL LOW (ref 55.8–66.1)
Alpha-1-Globulin: 4.5 % (ref 2.9–4.9)
Alpha-2-Globulin: 12.1 % — ABNORMAL HIGH (ref 7.1–11.8)
Beta 2: 6.2 % (ref 3.2–6.5)
Gamma Globulin: 20.7 % — ABNORMAL HIGH (ref 11.1–18.8)

## 2013-06-21 LAB — FOLATE RBC: RBC Folate: 744 ng/mL — ABNORMAL HIGH (ref >=366)

## 2013-06-21 LAB — IMMUNOFIXATION ELECTROPHORESIS: Total Protein ELP: 8.1 g/dL (ref 6.0–8.3)

## 2013-06-22 DIAGNOSIS — I5041 Acute combined systolic (congestive) and diastolic (congestive) heart failure: Secondary | ICD-10-CM | POA: Diagnosis not present

## 2013-06-22 DIAGNOSIS — I130 Hypertensive heart and chronic kidney disease with heart failure and stage 1 through stage 4 chronic kidney disease, or unspecified chronic kidney disease: Secondary | ICD-10-CM | POA: Diagnosis not present

## 2013-06-22 DIAGNOSIS — E782 Mixed hyperlipidemia: Secondary | ICD-10-CM | POA: Diagnosis not present

## 2013-06-29 DIAGNOSIS — I5041 Acute combined systolic (congestive) and diastolic (congestive) heart failure: Secondary | ICD-10-CM | POA: Diagnosis not present

## 2013-06-29 DIAGNOSIS — F329 Major depressive disorder, single episode, unspecified: Secondary | ICD-10-CM | POA: Diagnosis not present

## 2013-06-29 DIAGNOSIS — I130 Hypertensive heart and chronic kidney disease with heart failure and stage 1 through stage 4 chronic kidney disease, or unspecified chronic kidney disease: Secondary | ICD-10-CM | POA: Diagnosis not present

## 2013-07-01 ENCOUNTER — Encounter (HOSPITAL_COMMUNITY): Payer: Medicare Other

## 2013-08-03 DIAGNOSIS — Z9119 Patient's noncompliance with other medical treatment and regimen: Secondary | ICD-10-CM | POA: Diagnosis not present

## 2013-08-03 DIAGNOSIS — I5041 Acute combined systolic (congestive) and diastolic (congestive) heart failure: Secondary | ICD-10-CM | POA: Diagnosis not present

## 2013-08-03 DIAGNOSIS — I130 Hypertensive heart and chronic kidney disease with heart failure and stage 1 through stage 4 chronic kidney disease, or unspecified chronic kidney disease: Secondary | ICD-10-CM | POA: Diagnosis not present

## 2013-08-17 DIAGNOSIS — I509 Heart failure, unspecified: Secondary | ICD-10-CM | POA: Diagnosis not present

## 2013-08-17 DIAGNOSIS — R0602 Shortness of breath: Secondary | ICD-10-CM | POA: Diagnosis not present

## 2013-08-17 DIAGNOSIS — Z006 Encounter for examination for normal comparison and control in clinical research program: Secondary | ICD-10-CM | POA: Diagnosis not present

## 2013-08-17 DIAGNOSIS — F17200 Nicotine dependence, unspecified, uncomplicated: Secondary | ICD-10-CM | POA: Diagnosis not present

## 2013-08-17 DIAGNOSIS — I130 Hypertensive heart and chronic kidney disease with heart failure and stage 1 through stage 4 chronic kidney disease, or unspecified chronic kidney disease: Secondary | ICD-10-CM | POA: Diagnosis not present

## 2013-08-17 DIAGNOSIS — R609 Edema, unspecified: Secondary | ICD-10-CM | POA: Diagnosis not present

## 2013-08-17 DIAGNOSIS — Z79899 Other long term (current) drug therapy: Secondary | ICD-10-CM | POA: Diagnosis not present

## 2013-08-17 DIAGNOSIS — I5041 Acute combined systolic (congestive) and diastolic (congestive) heart failure: Secondary | ICD-10-CM | POA: Diagnosis not present

## 2013-09-06 ENCOUNTER — Encounter: Payer: Medicare Other | Admitting: Interventional Cardiology

## 2013-09-20 DIAGNOSIS — J069 Acute upper respiratory infection, unspecified: Secondary | ICD-10-CM | POA: Diagnosis not present

## 2013-09-20 DIAGNOSIS — I5041 Acute combined systolic (congestive) and diastolic (congestive) heart failure: Secondary | ICD-10-CM | POA: Diagnosis not present

## 2013-09-20 DIAGNOSIS — I130 Hypertensive heart and chronic kidney disease with heart failure and stage 1 through stage 4 chronic kidney disease, or unspecified chronic kidney disease: Secondary | ICD-10-CM | POA: Diagnosis not present

## 2013-10-01 ENCOUNTER — Encounter: Payer: Self-pay | Admitting: Interventional Cardiology

## 2013-11-05 DIAGNOSIS — F17200 Nicotine dependence, unspecified, uncomplicated: Secondary | ICD-10-CM | POA: Diagnosis not present

## 2013-11-05 DIAGNOSIS — I5041 Acute combined systolic (congestive) and diastolic (congestive) heart failure: Secondary | ICD-10-CM | POA: Diagnosis not present

## 2013-11-05 DIAGNOSIS — I130 Hypertensive heart and chronic kidney disease with heart failure and stage 1 through stage 4 chronic kidney disease, or unspecified chronic kidney disease: Secondary | ICD-10-CM | POA: Diagnosis not present

## 2013-11-05 DIAGNOSIS — N039 Chronic nephritic syndrome with unspecified morphologic changes: Secondary | ICD-10-CM | POA: Diagnosis not present

## 2013-11-05 DIAGNOSIS — N183 Chronic kidney disease, stage 3 unspecified: Secondary | ICD-10-CM | POA: Diagnosis not present

## 2013-12-03 DIAGNOSIS — N183 Chronic kidney disease, stage 3 unspecified: Secondary | ICD-10-CM | POA: Diagnosis not present

## 2013-12-03 DIAGNOSIS — N039 Chronic nephritic syndrome with unspecified morphologic changes: Secondary | ICD-10-CM | POA: Diagnosis not present

## 2013-12-03 DIAGNOSIS — R350 Frequency of micturition: Secondary | ICD-10-CM | POA: Diagnosis not present

## 2013-12-03 DIAGNOSIS — Z79899 Other long term (current) drug therapy: Secondary | ICD-10-CM | POA: Diagnosis not present

## 2013-12-03 DIAGNOSIS — I130 Hypertensive heart and chronic kidney disease with heart failure and stage 1 through stage 4 chronic kidney disease, or unspecified chronic kidney disease: Secondary | ICD-10-CM | POA: Diagnosis not present

## 2013-12-03 DIAGNOSIS — I5041 Acute combined systolic (congestive) and diastolic (congestive) heart failure: Secondary | ICD-10-CM | POA: Diagnosis not present

## 2013-12-03 DIAGNOSIS — Z113 Encounter for screening for infections with a predominantly sexual mode of transmission: Secondary | ICD-10-CM | POA: Diagnosis not present

## 2013-12-03 DIAGNOSIS — N398 Other specified disorders of urinary system: Secondary | ICD-10-CM | POA: Diagnosis not present

## 2014-03-02 DIAGNOSIS — I509 Heart failure, unspecified: Secondary | ICD-10-CM | POA: Diagnosis not present

## 2014-03-02 DIAGNOSIS — F17200 Nicotine dependence, unspecified, uncomplicated: Secondary | ICD-10-CM | POA: Diagnosis not present

## 2014-03-02 DIAGNOSIS — I130 Hypertensive heart and chronic kidney disease with heart failure and stage 1 through stage 4 chronic kidney disease, or unspecified chronic kidney disease: Secondary | ICD-10-CM | POA: Diagnosis not present

## 2014-03-02 DIAGNOSIS — R000 Tachycardia, unspecified: Secondary | ICD-10-CM | POA: Diagnosis not present

## 2014-03-02 DIAGNOSIS — N183 Chronic kidney disease, stage 3 unspecified: Secondary | ICD-10-CM | POA: Diagnosis not present

## 2014-03-02 DIAGNOSIS — N039 Chronic nephritic syndrome with unspecified morphologic changes: Secondary | ICD-10-CM | POA: Diagnosis not present

## 2014-03-02 DIAGNOSIS — Z79899 Other long term (current) drug therapy: Secondary | ICD-10-CM | POA: Diagnosis not present

## 2014-03-02 DIAGNOSIS — F329 Major depressive disorder, single episode, unspecified: Secondary | ICD-10-CM | POA: Diagnosis not present

## 2014-03-02 DIAGNOSIS — J309 Allergic rhinitis, unspecified: Secondary | ICD-10-CM | POA: Diagnosis not present

## 2014-03-02 DIAGNOSIS — F0150 Vascular dementia without behavioral disturbance: Secondary | ICD-10-CM | POA: Diagnosis not present

## 2014-03-02 DIAGNOSIS — E782 Mixed hyperlipidemia: Secondary | ICD-10-CM | POA: Diagnosis not present

## 2014-03-02 DIAGNOSIS — I5041 Acute combined systolic (congestive) and diastolic (congestive) heart failure: Secondary | ICD-10-CM | POA: Diagnosis not present

## 2014-04-04 DIAGNOSIS — N039 Chronic nephritic syndrome with unspecified morphologic changes: Secondary | ICD-10-CM | POA: Diagnosis not present

## 2014-04-04 DIAGNOSIS — Z91199 Patient's noncompliance with other medical treatment and regimen due to unspecified reason: Secondary | ICD-10-CM | POA: Diagnosis not present

## 2014-04-04 DIAGNOSIS — I509 Heart failure, unspecified: Secondary | ICD-10-CM | POA: Diagnosis not present

## 2014-04-04 DIAGNOSIS — I5041 Acute combined systolic (congestive) and diastolic (congestive) heart failure: Secondary | ICD-10-CM | POA: Diagnosis not present

## 2014-04-04 DIAGNOSIS — F17200 Nicotine dependence, unspecified, uncomplicated: Secondary | ICD-10-CM | POA: Diagnosis not present

## 2014-04-04 DIAGNOSIS — Z9119 Patient's noncompliance with other medical treatment and regimen: Secondary | ICD-10-CM | POA: Diagnosis not present

## 2014-04-04 DIAGNOSIS — I130 Hypertensive heart and chronic kidney disease with heart failure and stage 1 through stage 4 chronic kidney disease, or unspecified chronic kidney disease: Secondary | ICD-10-CM | POA: Diagnosis not present

## 2015-05-31 DIAGNOSIS — J069 Acute upper respiratory infection, unspecified: Secondary | ICD-10-CM | POA: Diagnosis not present

## 2015-05-31 DIAGNOSIS — B9681 Helicobacter pylori [H. pylori] as the cause of diseases classified elsewhere: Secondary | ICD-10-CM | POA: Diagnosis not present

## 2015-05-31 DIAGNOSIS — J441 Chronic obstructive pulmonary disease with (acute) exacerbation: Secondary | ICD-10-CM | POA: Diagnosis not present

## 2015-06-02 DIAGNOSIS — Z125 Encounter for screening for malignant neoplasm of prostate: Secondary | ICD-10-CM | POA: Diagnosis not present

## 2015-06-02 DIAGNOSIS — Z79899 Other long term (current) drug therapy: Secondary | ICD-10-CM | POA: Diagnosis not present

## 2015-06-02 DIAGNOSIS — R1084 Generalized abdominal pain: Secondary | ICD-10-CM | POA: Diagnosis not present

## 2015-06-02 DIAGNOSIS — I130 Hypertensive heart and chronic kidney disease with heart failure and stage 1 through stage 4 chronic kidney disease, or unspecified chronic kidney disease: Secondary | ICD-10-CM | POA: Diagnosis not present

## 2015-06-02 DIAGNOSIS — J449 Chronic obstructive pulmonary disease, unspecified: Secondary | ICD-10-CM | POA: Diagnosis not present

## 2015-06-02 DIAGNOSIS — N183 Chronic kidney disease, stage 3 (moderate): Secondary | ICD-10-CM | POA: Diagnosis not present

## 2015-06-20 DIAGNOSIS — R972 Elevated prostate specific antigen [PSA]: Secondary | ICD-10-CM | POA: Diagnosis not present

## 2015-06-20 DIAGNOSIS — Z0000 Encounter for general adult medical examination without abnormal findings: Secondary | ICD-10-CM | POA: Diagnosis not present

## 2015-06-20 DIAGNOSIS — N183 Chronic kidney disease, stage 3 (moderate): Secondary | ICD-10-CM | POA: Diagnosis not present

## 2015-06-20 DIAGNOSIS — Z79899 Other long term (current) drug therapy: Secondary | ICD-10-CM | POA: Diagnosis not present

## 2015-06-20 DIAGNOSIS — I130 Hypertensive heart and chronic kidney disease with heart failure and stage 1 through stage 4 chronic kidney disease, or unspecified chronic kidney disease: Secondary | ICD-10-CM | POA: Diagnosis not present

## 2015-06-20 DIAGNOSIS — Z23 Encounter for immunization: Secondary | ICD-10-CM | POA: Diagnosis not present

## 2015-09-05 DIAGNOSIS — J449 Chronic obstructive pulmonary disease, unspecified: Secondary | ICD-10-CM | POA: Diagnosis not present

## 2015-09-05 DIAGNOSIS — R1310 Dysphagia, unspecified: Secondary | ICD-10-CM | POA: Diagnosis not present

## 2015-09-05 DIAGNOSIS — Z7951 Long term (current) use of inhaled steroids: Secondary | ICD-10-CM | POA: Diagnosis not present

## 2015-09-05 DIAGNOSIS — J432 Centrilobular emphysema: Secondary | ICD-10-CM | POA: Diagnosis not present

## 2015-09-05 DIAGNOSIS — K449 Diaphragmatic hernia without obstruction or gangrene: Secondary | ICD-10-CM | POA: Diagnosis not present

## 2015-09-05 DIAGNOSIS — R0602 Shortness of breath: Secondary | ICD-10-CM | POA: Diagnosis not present

## 2015-09-05 DIAGNOSIS — K30 Functional dyspepsia: Secondary | ICD-10-CM | POA: Diagnosis not present

## 2015-09-05 DIAGNOSIS — R799 Abnormal finding of blood chemistry, unspecified: Secondary | ICD-10-CM | POA: Diagnosis not present

## 2015-09-05 DIAGNOSIS — R7989 Other specified abnormal findings of blood chemistry: Secondary | ICD-10-CM | POA: Diagnosis not present

## 2015-09-05 DIAGNOSIS — I10 Essential (primary) hypertension: Secondary | ICD-10-CM | POA: Diagnosis not present

## 2015-09-05 DIAGNOSIS — R634 Abnormal weight loss: Secondary | ICD-10-CM | POA: Diagnosis not present

## 2015-09-05 DIAGNOSIS — K219 Gastro-esophageal reflux disease without esophagitis: Secondary | ICD-10-CM | POA: Diagnosis not present

## 2015-09-05 DIAGNOSIS — N189 Chronic kidney disease, unspecified: Secondary | ICD-10-CM | POA: Diagnosis not present

## 2015-09-05 DIAGNOSIS — K922 Gastrointestinal hemorrhage, unspecified: Secondary | ICD-10-CM | POA: Diagnosis not present

## 2015-09-05 DIAGNOSIS — R748 Abnormal levels of other serum enzymes: Secondary | ICD-10-CM | POA: Diagnosis not present

## 2015-09-05 DIAGNOSIS — F329 Major depressive disorder, single episode, unspecified: Secondary | ICD-10-CM | POA: Diagnosis not present

## 2015-09-05 DIAGNOSIS — R1013 Epigastric pain: Secondary | ICD-10-CM | POA: Diagnosis not present

## 2015-09-05 DIAGNOSIS — N179 Acute kidney failure, unspecified: Secondary | ICD-10-CM | POA: Diagnosis not present

## 2015-09-05 DIAGNOSIS — I129 Hypertensive chronic kidney disease with stage 1 through stage 4 chronic kidney disease, or unspecified chronic kidney disease: Secondary | ICD-10-CM | POA: Diagnosis not present

## 2015-09-05 DIAGNOSIS — Z79899 Other long term (current) drug therapy: Secondary | ICD-10-CM | POA: Diagnosis not present

## 2015-09-05 DIAGNOSIS — E559 Vitamin D deficiency, unspecified: Secondary | ICD-10-CM | POA: Diagnosis not present

## 2015-09-05 DIAGNOSIS — N183 Chronic kidney disease, stage 3 (moderate): Secondary | ICD-10-CM | POA: Diagnosis not present

## 2015-09-05 DIAGNOSIS — Z6820 Body mass index (BMI) 20.0-20.9, adult: Secondary | ICD-10-CM | POA: Diagnosis not present

## 2015-09-05 DIAGNOSIS — F17200 Nicotine dependence, unspecified, uncomplicated: Secondary | ICD-10-CM | POA: Diagnosis present

## 2015-09-05 DIAGNOSIS — E860 Dehydration: Secondary | ICD-10-CM | POA: Diagnosis not present

## 2015-09-05 DIAGNOSIS — R112 Nausea with vomiting, unspecified: Secondary | ICD-10-CM | POA: Diagnosis not present

## 2015-09-18 DIAGNOSIS — R748 Abnormal levels of other serum enzymes: Secondary | ICD-10-CM | POA: Diagnosis not present

## 2015-09-18 DIAGNOSIS — R630 Anorexia: Secondary | ICD-10-CM | POA: Diagnosis not present

## 2015-09-18 DIAGNOSIS — R109 Unspecified abdominal pain: Secondary | ICD-10-CM | POA: Diagnosis not present

## 2015-09-18 DIAGNOSIS — R140 Abdominal distension (gaseous): Secondary | ICD-10-CM | POA: Diagnosis not present

## 2015-09-18 DIAGNOSIS — R918 Other nonspecific abnormal finding of lung field: Secondary | ICD-10-CM | POA: Diagnosis not present

## 2015-09-18 DIAGNOSIS — R0602 Shortness of breath: Secondary | ICD-10-CM | POA: Diagnosis not present

## 2015-09-28 DIAGNOSIS — K210 Gastro-esophageal reflux disease with esophagitis: Secondary | ICD-10-CM | POA: Diagnosis not present

## 2015-09-28 DIAGNOSIS — J440 Chronic obstructive pulmonary disease with acute lower respiratory infection: Secondary | ICD-10-CM | POA: Diagnosis not present

## 2015-09-28 DIAGNOSIS — I5023 Acute on chronic systolic (congestive) heart failure: Secondary | ICD-10-CM | POA: Diagnosis not present

## 2015-09-28 DIAGNOSIS — I10 Essential (primary) hypertension: Secondary | ICD-10-CM | POA: Diagnosis not present

## 2015-09-28 DIAGNOSIS — N183 Chronic kidney disease, stage 3 (moderate): Secondary | ICD-10-CM | POA: Diagnosis not present

## 2015-09-28 DIAGNOSIS — F330 Major depressive disorder, recurrent, mild: Secondary | ICD-10-CM | POA: Diagnosis not present

## 2015-10-03 DIAGNOSIS — R630 Anorexia: Secondary | ICD-10-CM | POA: Diagnosis not present

## 2015-10-03 DIAGNOSIS — R748 Abnormal levels of other serum enzymes: Secondary | ICD-10-CM | POA: Diagnosis not present

## 2015-10-05 DIAGNOSIS — R931 Abnormal findings on diagnostic imaging of heart and coronary circulation: Secondary | ICD-10-CM | POA: Diagnosis not present

## 2015-10-05 DIAGNOSIS — I340 Nonrheumatic mitral (valve) insufficiency: Secondary | ICD-10-CM | POA: Diagnosis not present

## 2015-10-05 DIAGNOSIS — R55 Syncope and collapse: Secondary | ICD-10-CM | POA: Diagnosis not present

## 2015-10-05 DIAGNOSIS — I517 Cardiomegaly: Secondary | ICD-10-CM | POA: Diagnosis not present

## 2015-10-05 DIAGNOSIS — I351 Nonrheumatic aortic (valve) insufficiency: Secondary | ICD-10-CM | POA: Diagnosis not present

## 2015-10-05 DIAGNOSIS — I272 Other secondary pulmonary hypertension: Secondary | ICD-10-CM | POA: Diagnosis not present

## 2015-10-05 DIAGNOSIS — I361 Nonrheumatic tricuspid (valve) insufficiency: Secondary | ICD-10-CM | POA: Diagnosis not present

## 2015-10-27 DIAGNOSIS — G40802 Other epilepsy, not intractable, without status epilepticus: Secondary | ICD-10-CM | POA: Diagnosis not present

## 2015-11-27 DIAGNOSIS — I951 Orthostatic hypotension: Secondary | ICD-10-CM | POA: Diagnosis not present

## 2015-11-27 DIAGNOSIS — G40802 Other epilepsy, not intractable, without status epilepticus: Secondary | ICD-10-CM | POA: Diagnosis not present

## 2016-01-16 DIAGNOSIS — G40802 Other epilepsy, not intractable, without status epilepticus: Secondary | ICD-10-CM | POA: Diagnosis not present

## 2016-01-16 DIAGNOSIS — I10 Essential (primary) hypertension: Secondary | ICD-10-CM | POA: Diagnosis not present

## 2016-01-16 DIAGNOSIS — I951 Orthostatic hypotension: Secondary | ICD-10-CM | POA: Diagnosis not present

## 2016-01-16 DIAGNOSIS — B358 Other dermatophytoses: Secondary | ICD-10-CM | POA: Diagnosis not present

## 2016-01-16 DIAGNOSIS — I5022 Chronic systolic (congestive) heart failure: Secondary | ICD-10-CM | POA: Diagnosis not present

## 2016-04-16 DIAGNOSIS — B358 Other dermatophytoses: Secondary | ICD-10-CM | POA: Diagnosis not present

## 2016-04-16 DIAGNOSIS — I951 Orthostatic hypotension: Secondary | ICD-10-CM | POA: Diagnosis not present

## 2016-04-16 DIAGNOSIS — I10 Essential (primary) hypertension: Secondary | ICD-10-CM | POA: Diagnosis not present

## 2016-04-16 DIAGNOSIS — G40802 Other epilepsy, not intractable, without status epilepticus: Secondary | ICD-10-CM | POA: Diagnosis not present

## 2016-04-16 DIAGNOSIS — I5022 Chronic systolic (congestive) heart failure: Secondary | ICD-10-CM | POA: Diagnosis not present

## 2016-04-16 DIAGNOSIS — Z131 Encounter for screening for diabetes mellitus: Secondary | ICD-10-CM | POA: Diagnosis not present

## 2016-05-09 DIAGNOSIS — N289 Disorder of kidney and ureter, unspecified: Secondary | ICD-10-CM | POA: Diagnosis not present

## 2016-05-09 DIAGNOSIS — I509 Heart failure, unspecified: Secondary | ICD-10-CM | POA: Diagnosis not present

## 2016-05-09 DIAGNOSIS — R079 Chest pain, unspecified: Secondary | ICD-10-CM | POA: Diagnosis not present

## 2016-05-09 DIAGNOSIS — R9431 Abnormal electrocardiogram [ECG] [EKG]: Secondary | ICD-10-CM | POA: Diagnosis not present

## 2016-05-09 DIAGNOSIS — R739 Hyperglycemia, unspecified: Secondary | ICD-10-CM | POA: Diagnosis not present

## 2016-05-09 DIAGNOSIS — R0602 Shortness of breath: Secondary | ICD-10-CM | POA: Diagnosis not present

## 2016-05-09 DIAGNOSIS — D649 Anemia, unspecified: Secondary | ICD-10-CM | POA: Diagnosis not present

## 2016-05-16 DIAGNOSIS — I208 Other forms of angina pectoris: Secondary | ICD-10-CM | POA: Diagnosis not present

## 2016-05-16 DIAGNOSIS — I10 Essential (primary) hypertension: Secondary | ICD-10-CM | POA: Diagnosis not present

## 2016-05-16 DIAGNOSIS — I5022 Chronic systolic (congestive) heart failure: Secondary | ICD-10-CM | POA: Diagnosis not present

## 2016-08-21 DIAGNOSIS — R51 Headache: Secondary | ICD-10-CM | POA: Diagnosis not present

## 2016-08-21 DIAGNOSIS — N189 Chronic kidney disease, unspecified: Secondary | ICD-10-CM | POA: Diagnosis not present

## 2016-08-21 DIAGNOSIS — K219 Gastro-esophageal reflux disease without esophagitis: Secondary | ICD-10-CM | POA: Diagnosis not present

## 2016-08-21 DIAGNOSIS — Z87891 Personal history of nicotine dependence: Secondary | ICD-10-CM | POA: Diagnosis not present

## 2016-08-21 DIAGNOSIS — I129 Hypertensive chronic kidney disease with stage 1 through stage 4 chronic kidney disease, or unspecified chronic kidney disease: Secondary | ICD-10-CM | POA: Diagnosis not present

## 2016-08-21 DIAGNOSIS — I10 Essential (primary) hypertension: Secondary | ICD-10-CM | POA: Diagnosis not present

## 2016-08-21 DIAGNOSIS — J449 Chronic obstructive pulmonary disease, unspecified: Secondary | ICD-10-CM | POA: Diagnosis not present

## 2016-08-25 DIAGNOSIS — R112 Nausea with vomiting, unspecified: Secondary | ICD-10-CM | POA: Diagnosis not present

## 2016-08-25 DIAGNOSIS — Z87891 Personal history of nicotine dependence: Secondary | ICD-10-CM | POA: Diagnosis not present

## 2016-08-25 DIAGNOSIS — R404 Transient alteration of awareness: Secondary | ICD-10-CM | POA: Diagnosis not present

## 2016-08-25 DIAGNOSIS — E559 Vitamin D deficiency, unspecified: Secondary | ICD-10-CM | POA: Diagnosis not present

## 2016-08-25 DIAGNOSIS — F330 Major depressive disorder, recurrent, mild: Secondary | ICD-10-CM | POA: Diagnosis not present

## 2016-08-25 DIAGNOSIS — D638 Anemia in other chronic diseases classified elsewhere: Secondary | ICD-10-CM | POA: Diagnosis not present

## 2016-08-25 DIAGNOSIS — R42 Dizziness and giddiness: Secondary | ICD-10-CM | POA: Diagnosis not present

## 2016-08-25 DIAGNOSIS — Z23 Encounter for immunization: Secondary | ICD-10-CM | POA: Diagnosis not present

## 2016-08-25 DIAGNOSIS — I270 Primary pulmonary hypertension: Secondary | ICD-10-CM | POA: Diagnosis not present

## 2016-08-25 DIAGNOSIS — I10 Essential (primary) hypertension: Secondary | ICD-10-CM | POA: Diagnosis not present

## 2016-08-25 DIAGNOSIS — N189 Chronic kidney disease, unspecified: Secondary | ICD-10-CM | POA: Diagnosis not present

## 2016-08-26 DIAGNOSIS — F330 Major depressive disorder, recurrent, mild: Secondary | ICD-10-CM | POA: Diagnosis not present

## 2016-08-26 DIAGNOSIS — I270 Primary pulmonary hypertension: Secondary | ICD-10-CM | POA: Diagnosis not present

## 2016-08-26 DIAGNOSIS — Z23 Encounter for immunization: Secondary | ICD-10-CM | POA: Diagnosis not present

## 2016-08-26 DIAGNOSIS — E559 Vitamin D deficiency, unspecified: Secondary | ICD-10-CM | POA: Diagnosis not present

## 2016-08-26 DIAGNOSIS — D638 Anemia in other chronic diseases classified elsewhere: Secondary | ICD-10-CM | POA: Diagnosis not present

## 2016-08-28 DIAGNOSIS — I270 Primary pulmonary hypertension: Secondary | ICD-10-CM | POA: Diagnosis not present

## 2016-08-28 DIAGNOSIS — Z23 Encounter for immunization: Secondary | ICD-10-CM | POA: Diagnosis not present

## 2016-08-28 DIAGNOSIS — E559 Vitamin D deficiency, unspecified: Secondary | ICD-10-CM | POA: Diagnosis not present

## 2016-08-28 DIAGNOSIS — D638 Anemia in other chronic diseases classified elsewhere: Secondary | ICD-10-CM | POA: Diagnosis not present

## 2016-08-28 DIAGNOSIS — F330 Major depressive disorder, recurrent, mild: Secondary | ICD-10-CM | POA: Diagnosis not present

## 2016-09-27 DIAGNOSIS — G4089 Other seizures: Secondary | ICD-10-CM | POA: Diagnosis not present

## 2016-09-27 DIAGNOSIS — I10 Essential (primary) hypertension: Secondary | ICD-10-CM | POA: Diagnosis not present

## 2016-09-27 DIAGNOSIS — I5022 Chronic systolic (congestive) heart failure: Secondary | ICD-10-CM | POA: Diagnosis not present

## 2017-02-21 DIAGNOSIS — I5022 Chronic systolic (congestive) heart failure: Secondary | ICD-10-CM | POA: Diagnosis not present

## 2017-02-21 DIAGNOSIS — I10 Essential (primary) hypertension: Secondary | ICD-10-CM | POA: Diagnosis not present

## 2017-02-21 DIAGNOSIS — R6889 Other general symptoms and signs: Secondary | ICD-10-CM | POA: Diagnosis not present

## 2017-02-21 DIAGNOSIS — G4089 Other seizures: Secondary | ICD-10-CM | POA: Diagnosis not present

## 2017-02-21 DIAGNOSIS — Z6820 Body mass index (BMI) 20.0-20.9, adult: Secondary | ICD-10-CM | POA: Diagnosis not present

## 2017-02-21 DIAGNOSIS — Z125 Encounter for screening for malignant neoplasm of prostate: Secondary | ICD-10-CM | POA: Diagnosis not present

## 2017-06-05 DIAGNOSIS — I5022 Chronic systolic (congestive) heart failure: Secondary | ICD-10-CM | POA: Diagnosis not present

## 2017-06-05 DIAGNOSIS — Z6820 Body mass index (BMI) 20.0-20.9, adult: Secondary | ICD-10-CM | POA: Diagnosis not present

## 2017-06-05 DIAGNOSIS — G4089 Other seizures: Secondary | ICD-10-CM | POA: Diagnosis not present

## 2017-06-05 DIAGNOSIS — I10 Essential (primary) hypertension: Secondary | ICD-10-CM | POA: Diagnosis not present

## 2017-06-11 DIAGNOSIS — Z125 Encounter for screening for malignant neoplasm of prostate: Secondary | ICD-10-CM | POA: Diagnosis not present

## 2017-06-11 DIAGNOSIS — I10 Essential (primary) hypertension: Secondary | ICD-10-CM | POA: Diagnosis not present

## 2017-08-01 DIAGNOSIS — J449 Chronic obstructive pulmonary disease, unspecified: Secondary | ICD-10-CM | POA: Diagnosis not present

## 2017-08-01 DIAGNOSIS — R079 Chest pain, unspecified: Secondary | ICD-10-CM | POA: Diagnosis not present

## 2017-08-01 DIAGNOSIS — I509 Heart failure, unspecified: Secondary | ICD-10-CM | POA: Diagnosis not present

## 2017-08-01 DIAGNOSIS — I160 Hypertensive urgency: Secondary | ICD-10-CM | POA: Diagnosis not present

## 2017-08-01 DIAGNOSIS — N184 Chronic kidney disease, stage 4 (severe): Secondary | ICD-10-CM | POA: Diagnosis not present

## 2017-08-01 DIAGNOSIS — R0789 Other chest pain: Secondary | ICD-10-CM | POA: Diagnosis not present

## 2017-08-01 DIAGNOSIS — I130 Hypertensive heart and chronic kidney disease with heart failure and stage 1 through stage 4 chronic kidney disease, or unspecified chronic kidney disease: Secondary | ICD-10-CM | POA: Diagnosis not present

## 2017-08-01 DIAGNOSIS — R0602 Shortness of breath: Secondary | ICD-10-CM | POA: Diagnosis not present

## 2017-08-01 DIAGNOSIS — I214 Non-ST elevation (NSTEMI) myocardial infarction: Secondary | ICD-10-CM | POA: Diagnosis not present

## 2017-08-01 DIAGNOSIS — Z23 Encounter for immunization: Secondary | ICD-10-CM | POA: Diagnosis not present

## 2017-08-02 DIAGNOSIS — J449 Chronic obstructive pulmonary disease, unspecified: Secondary | ICD-10-CM | POA: Diagnosis not present

## 2017-08-02 DIAGNOSIS — R0789 Other chest pain: Secondary | ICD-10-CM | POA: Diagnosis not present

## 2017-08-02 DIAGNOSIS — I214 Non-ST elevation (NSTEMI) myocardial infarction: Secondary | ICD-10-CM | POA: Diagnosis not present

## 2017-08-02 DIAGNOSIS — N184 Chronic kidney disease, stage 4 (severe): Secondary | ICD-10-CM | POA: Diagnosis not present

## 2017-08-02 DIAGNOSIS — R0602 Shortness of breath: Secondary | ICD-10-CM | POA: Diagnosis not present

## 2017-08-02 DIAGNOSIS — I160 Hypertensive urgency: Secondary | ICD-10-CM | POA: Diagnosis not present

## 2017-08-02 DIAGNOSIS — Z23 Encounter for immunization: Secondary | ICD-10-CM | POA: Diagnosis not present

## 2017-08-02 DIAGNOSIS — I509 Heart failure, unspecified: Secondary | ICD-10-CM | POA: Diagnosis not present

## 2017-08-02 DIAGNOSIS — I130 Hypertensive heart and chronic kidney disease with heart failure and stage 1 through stage 4 chronic kidney disease, or unspecified chronic kidney disease: Secondary | ICD-10-CM | POA: Diagnosis not present

## 2017-08-02 DIAGNOSIS — R079 Chest pain, unspecified: Secondary | ICD-10-CM | POA: Diagnosis not present

## 2017-08-03 DIAGNOSIS — R0602 Shortness of breath: Secondary | ICD-10-CM | POA: Diagnosis not present

## 2017-08-03 DIAGNOSIS — Z23 Encounter for immunization: Secondary | ICD-10-CM | POA: Diagnosis not present

## 2017-08-03 DIAGNOSIS — I509 Heart failure, unspecified: Secondary | ICD-10-CM | POA: Diagnosis not present

## 2017-08-03 DIAGNOSIS — I160 Hypertensive urgency: Secondary | ICD-10-CM | POA: Diagnosis not present

## 2017-08-03 DIAGNOSIS — R079 Chest pain, unspecified: Secondary | ICD-10-CM | POA: Diagnosis not present

## 2017-08-03 DIAGNOSIS — I130 Hypertensive heart and chronic kidney disease with heart failure and stage 1 through stage 4 chronic kidney disease, or unspecified chronic kidney disease: Secondary | ICD-10-CM | POA: Diagnosis not present

## 2017-08-03 DIAGNOSIS — I214 Non-ST elevation (NSTEMI) myocardial infarction: Secondary | ICD-10-CM | POA: Diagnosis not present

## 2017-08-03 DIAGNOSIS — J449 Chronic obstructive pulmonary disease, unspecified: Secondary | ICD-10-CM | POA: Diagnosis not present

## 2017-08-03 DIAGNOSIS — N184 Chronic kidney disease, stage 4 (severe): Secondary | ICD-10-CM | POA: Diagnosis not present

## 2017-08-03 DIAGNOSIS — R0789 Other chest pain: Secondary | ICD-10-CM | POA: Diagnosis not present

## 2017-08-19 DIAGNOSIS — I5023 Acute on chronic systolic (congestive) heart failure: Secondary | ICD-10-CM | POA: Diagnosis not present

## 2017-08-19 DIAGNOSIS — I214 Non-ST elevation (NSTEMI) myocardial infarction: Secondary | ICD-10-CM | POA: Diagnosis not present

## 2017-08-19 DIAGNOSIS — J441 Chronic obstructive pulmonary disease with (acute) exacerbation: Secondary | ICD-10-CM | POA: Diagnosis not present

## 2017-08-19 DIAGNOSIS — N184 Chronic kidney disease, stage 4 (severe): Secondary | ICD-10-CM | POA: Diagnosis not present

## 2017-08-19 DIAGNOSIS — I10 Essential (primary) hypertension: Secondary | ICD-10-CM | POA: Diagnosis not present

## 2017-08-19 DIAGNOSIS — Z6821 Body mass index (BMI) 21.0-21.9, adult: Secondary | ICD-10-CM | POA: Diagnosis not present

## 2017-09-09 DIAGNOSIS — I2510 Atherosclerotic heart disease of native coronary artery without angina pectoris: Secondary | ICD-10-CM | POA: Diagnosis not present

## 2017-09-09 DIAGNOSIS — I130 Hypertensive heart and chronic kidney disease with heart failure and stage 1 through stage 4 chronic kidney disease, or unspecified chronic kidney disease: Secondary | ICD-10-CM | POA: Diagnosis not present

## 2017-09-09 DIAGNOSIS — K449 Diaphragmatic hernia without obstruction or gangrene: Secondary | ICD-10-CM | POA: Diagnosis not present

## 2017-09-09 DIAGNOSIS — I429 Cardiomyopathy, unspecified: Secondary | ICD-10-CM | POA: Diagnosis not present

## 2017-09-09 DIAGNOSIS — N183 Chronic kidney disease, stage 3 (moderate): Secondary | ICD-10-CM | POA: Diagnosis not present

## 2017-09-09 DIAGNOSIS — I252 Old myocardial infarction: Secondary | ICD-10-CM | POA: Diagnosis not present

## 2017-09-09 DIAGNOSIS — I10 Essential (primary) hypertension: Secondary | ICD-10-CM | POA: Diagnosis not present

## 2017-09-09 DIAGNOSIS — K219 Gastro-esophageal reflux disease without esophagitis: Secondary | ICD-10-CM | POA: Diagnosis not present

## 2017-09-09 DIAGNOSIS — I5022 Chronic systolic (congestive) heart failure: Secondary | ICD-10-CM | POA: Diagnosis not present

## 2017-09-09 DIAGNOSIS — J441 Chronic obstructive pulmonary disease with (acute) exacerbation: Secondary | ICD-10-CM | POA: Diagnosis not present

## 2017-09-09 DIAGNOSIS — R0600 Dyspnea, unspecified: Secondary | ICD-10-CM | POA: Diagnosis not present

## 2017-09-09 DIAGNOSIS — Z720 Tobacco use: Secondary | ICD-10-CM | POA: Diagnosis not present

## 2017-09-09 DIAGNOSIS — E785 Hyperlipidemia, unspecified: Secondary | ICD-10-CM | POA: Diagnosis not present

## 2017-09-10 DIAGNOSIS — K449 Diaphragmatic hernia without obstruction or gangrene: Secondary | ICD-10-CM | POA: Diagnosis not present

## 2017-09-10 DIAGNOSIS — J441 Chronic obstructive pulmonary disease with (acute) exacerbation: Secondary | ICD-10-CM | POA: Diagnosis not present

## 2017-09-10 DIAGNOSIS — I130 Hypertensive heart and chronic kidney disease with heart failure and stage 1 through stage 4 chronic kidney disease, or unspecified chronic kidney disease: Secondary | ICD-10-CM | POA: Diagnosis not present

## 2017-09-10 DIAGNOSIS — N183 Chronic kidney disease, stage 3 (moderate): Secondary | ICD-10-CM | POA: Diagnosis not present

## 2017-09-10 DIAGNOSIS — I5022 Chronic systolic (congestive) heart failure: Secondary | ICD-10-CM | POA: Diagnosis not present

## 2017-09-11 DIAGNOSIS — I130 Hypertensive heart and chronic kidney disease with heart failure and stage 1 through stage 4 chronic kidney disease, or unspecified chronic kidney disease: Secondary | ICD-10-CM | POA: Diagnosis not present

## 2017-09-11 DIAGNOSIS — J441 Chronic obstructive pulmonary disease with (acute) exacerbation: Secondary | ICD-10-CM | POA: Diagnosis not present

## 2017-09-11 DIAGNOSIS — I5022 Chronic systolic (congestive) heart failure: Secondary | ICD-10-CM | POA: Diagnosis not present

## 2017-09-11 DIAGNOSIS — K449 Diaphragmatic hernia without obstruction or gangrene: Secondary | ICD-10-CM | POA: Diagnosis not present

## 2017-09-11 DIAGNOSIS — N183 Chronic kidney disease, stage 3 (moderate): Secondary | ICD-10-CM | POA: Diagnosis not present

## 2017-09-19 DIAGNOSIS — Z6821 Body mass index (BMI) 21.0-21.9, adult: Secondary | ICD-10-CM | POA: Diagnosis not present

## 2017-09-19 DIAGNOSIS — M545 Low back pain: Secondary | ICD-10-CM | POA: Diagnosis not present

## 2017-09-19 DIAGNOSIS — J441 Chronic obstructive pulmonary disease with (acute) exacerbation: Secondary | ICD-10-CM | POA: Diagnosis not present

## 2017-10-23 DIAGNOSIS — J440 Chronic obstructive pulmonary disease with acute lower respiratory infection: Secondary | ICD-10-CM | POA: Diagnosis not present

## 2017-10-28 DIAGNOSIS — R109 Unspecified abdominal pain: Secondary | ICD-10-CM | POA: Diagnosis not present

## 2017-10-28 DIAGNOSIS — I5021 Acute systolic (congestive) heart failure: Secondary | ICD-10-CM | POA: Diagnosis not present

## 2017-10-28 DIAGNOSIS — N183 Chronic kidney disease, stage 3 (moderate): Secondary | ICD-10-CM | POA: Diagnosis not present

## 2017-10-28 DIAGNOSIS — I130 Hypertensive heart and chronic kidney disease with heart failure and stage 1 through stage 4 chronic kidney disease, or unspecified chronic kidney disease: Secondary | ICD-10-CM | POA: Diagnosis not present

## 2017-10-28 DIAGNOSIS — K810 Acute cholecystitis: Secondary | ICD-10-CM | POA: Diagnosis not present

## 2017-10-28 DIAGNOSIS — K449 Diaphragmatic hernia without obstruction or gangrene: Secondary | ICD-10-CM | POA: Diagnosis not present

## 2017-10-29 DIAGNOSIS — I2510 Atherosclerotic heart disease of native coronary artery without angina pectoris: Secondary | ICD-10-CM | POA: Diagnosis not present

## 2017-10-29 DIAGNOSIS — R109 Unspecified abdominal pain: Secondary | ICD-10-CM | POA: Diagnosis not present

## 2017-10-29 DIAGNOSIS — R1111 Vomiting without nausea: Secondary | ICD-10-CM | POA: Diagnosis not present

## 2017-10-29 DIAGNOSIS — Z8249 Family history of ischemic heart disease and other diseases of the circulatory system: Secondary | ICD-10-CM | POA: Diagnosis not present

## 2017-10-29 DIAGNOSIS — N281 Cyst of kidney, acquired: Secondary | ICD-10-CM | POA: Diagnosis not present

## 2017-10-29 DIAGNOSIS — I5021 Acute systolic (congestive) heart failure: Secondary | ICD-10-CM | POA: Diagnosis not present

## 2017-10-29 DIAGNOSIS — Z79899 Other long term (current) drug therapy: Secondary | ICD-10-CM | POA: Diagnosis not present

## 2017-10-29 DIAGNOSIS — K449 Diaphragmatic hernia without obstruction or gangrene: Secondary | ICD-10-CM | POA: Diagnosis not present

## 2017-10-29 DIAGNOSIS — N183 Chronic kidney disease, stage 3 (moderate): Secondary | ICD-10-CM | POA: Diagnosis not present

## 2017-10-29 DIAGNOSIS — I130 Hypertensive heart and chronic kidney disease with heart failure and stage 1 through stage 4 chronic kidney disease, or unspecified chronic kidney disease: Secondary | ICD-10-CM | POA: Diagnosis not present

## 2017-10-29 DIAGNOSIS — K811 Chronic cholecystitis: Secondary | ICD-10-CM | POA: Diagnosis not present

## 2017-10-29 DIAGNOSIS — J449 Chronic obstructive pulmonary disease, unspecified: Secondary | ICD-10-CM | POA: Diagnosis not present

## 2017-10-29 DIAGNOSIS — I5023 Acute on chronic systolic (congestive) heart failure: Secondary | ICD-10-CM | POA: Diagnosis not present

## 2017-10-29 DIAGNOSIS — Z833 Family history of diabetes mellitus: Secondary | ICD-10-CM | POA: Diagnosis not present

## 2017-10-29 DIAGNOSIS — Z800 Family history of malignant neoplasm of digestive organs: Secondary | ICD-10-CM | POA: Diagnosis not present

## 2017-10-29 DIAGNOSIS — E785 Hyperlipidemia, unspecified: Secondary | ICD-10-CM | POA: Diagnosis not present

## 2017-10-29 DIAGNOSIS — K810 Acute cholecystitis: Secondary | ICD-10-CM | POA: Diagnosis not present

## 2017-10-29 DIAGNOSIS — I252 Old myocardial infarction: Secondary | ICD-10-CM | POA: Diagnosis not present

## 2017-10-29 DIAGNOSIS — I5022 Chronic systolic (congestive) heart failure: Secondary | ICD-10-CM | POA: Diagnosis not present

## 2017-11-07 DIAGNOSIS — J440 Chronic obstructive pulmonary disease with acute lower respiratory infection: Secondary | ICD-10-CM | POA: Diagnosis not present

## 2017-11-10 DIAGNOSIS — K810 Acute cholecystitis: Secondary | ICD-10-CM | POA: Diagnosis not present

## 2017-11-10 DIAGNOSIS — R100 Acute abdomen: Secondary | ICD-10-CM | POA: Diagnosis not present

## 2017-11-16 DIAGNOSIS — Z800 Family history of malignant neoplasm of digestive organs: Secondary | ICD-10-CM | POA: Diagnosis not present

## 2017-11-16 DIAGNOSIS — K449 Diaphragmatic hernia without obstruction or gangrene: Secondary | ICD-10-CM | POA: Diagnosis not present

## 2017-11-16 DIAGNOSIS — R112 Nausea with vomiting, unspecified: Secondary | ICD-10-CM | POA: Diagnosis not present

## 2017-11-16 DIAGNOSIS — R1011 Right upper quadrant pain: Secondary | ICD-10-CM | POA: Diagnosis not present

## 2017-11-16 DIAGNOSIS — R269 Unspecified abnormalities of gait and mobility: Secondary | ICD-10-CM | POA: Diagnosis not present

## 2017-11-16 DIAGNOSIS — Z833 Family history of diabetes mellitus: Secondary | ICD-10-CM | POA: Diagnosis not present

## 2017-11-16 DIAGNOSIS — E785 Hyperlipidemia, unspecified: Secondary | ICD-10-CM | POA: Diagnosis not present

## 2017-11-16 DIAGNOSIS — Z79899 Other long term (current) drug therapy: Secondary | ICD-10-CM | POA: Diagnosis not present

## 2017-11-16 DIAGNOSIS — I252 Old myocardial infarction: Secondary | ICD-10-CM | POA: Diagnosis not present

## 2017-11-16 DIAGNOSIS — N184 Chronic kidney disease, stage 4 (severe): Secondary | ICD-10-CM | POA: Diagnosis not present

## 2017-11-16 DIAGNOSIS — I130 Hypertensive heart and chronic kidney disease with heart failure and stage 1 through stage 4 chronic kidney disease, or unspecified chronic kidney disease: Secondary | ICD-10-CM | POA: Diagnosis not present

## 2017-11-16 DIAGNOSIS — I5022 Chronic systolic (congestive) heart failure: Secondary | ICD-10-CM | POA: Diagnosis not present

## 2017-11-16 DIAGNOSIS — R1111 Vomiting without nausea: Secondary | ICD-10-CM | POA: Diagnosis not present

## 2017-11-16 DIAGNOSIS — Z7982 Long term (current) use of aspirin: Secondary | ICD-10-CM | POA: Diagnosis not present

## 2017-11-16 DIAGNOSIS — N179 Acute kidney failure, unspecified: Secondary | ICD-10-CM | POA: Diagnosis not present

## 2017-11-16 DIAGNOSIS — R1110 Vomiting, unspecified: Secondary | ICD-10-CM | POA: Diagnosis not present

## 2017-11-16 DIAGNOSIS — Z8249 Family history of ischemic heart disease and other diseases of the circulatory system: Secondary | ICD-10-CM | POA: Diagnosis not present

## 2017-11-16 DIAGNOSIS — K529 Noninfective gastroenteritis and colitis, unspecified: Secondary | ICD-10-CM | POA: Diagnosis not present

## 2017-11-16 DIAGNOSIS — R1031 Right lower quadrant pain: Secondary | ICD-10-CM | POA: Diagnosis not present

## 2017-11-17 DIAGNOSIS — R109 Unspecified abdominal pain: Secondary | ICD-10-CM | POA: Diagnosis not present

## 2017-11-18 DIAGNOSIS — Z8249 Family history of ischemic heart disease and other diseases of the circulatory system: Secondary | ICD-10-CM | POA: Diagnosis not present

## 2017-11-18 DIAGNOSIS — Z833 Family history of diabetes mellitus: Secondary | ICD-10-CM | POA: Diagnosis not present

## 2017-11-18 DIAGNOSIS — I5022 Chronic systolic (congestive) heart failure: Secondary | ICD-10-CM | POA: Diagnosis not present

## 2017-11-18 DIAGNOSIS — R1031 Right lower quadrant pain: Secondary | ICD-10-CM | POA: Diagnosis not present

## 2017-11-18 DIAGNOSIS — E785 Hyperlipidemia, unspecified: Secondary | ICD-10-CM | POA: Diagnosis not present

## 2017-11-18 DIAGNOSIS — I130 Hypertensive heart and chronic kidney disease with heart failure and stage 1 through stage 4 chronic kidney disease, or unspecified chronic kidney disease: Secondary | ICD-10-CM | POA: Diagnosis not present

## 2017-11-18 DIAGNOSIS — N178 Other acute kidney failure: Secondary | ICD-10-CM | POA: Diagnosis not present

## 2017-11-18 DIAGNOSIS — R109 Unspecified abdominal pain: Secondary | ICD-10-CM | POA: Diagnosis not present

## 2017-11-18 DIAGNOSIS — K529 Noninfective gastroenteritis and colitis, unspecified: Secondary | ICD-10-CM | POA: Diagnosis not present

## 2017-11-18 DIAGNOSIS — I252 Old myocardial infarction: Secondary | ICD-10-CM | POA: Diagnosis not present

## 2017-11-18 DIAGNOSIS — N179 Acute kidney failure, unspecified: Secondary | ICD-10-CM | POA: Diagnosis not present

## 2017-11-18 DIAGNOSIS — Z79899 Other long term (current) drug therapy: Secondary | ICD-10-CM | POA: Diagnosis not present

## 2017-11-18 DIAGNOSIS — Z7982 Long term (current) use of aspirin: Secondary | ICD-10-CM | POA: Diagnosis not present

## 2017-11-18 DIAGNOSIS — R269 Unspecified abnormalities of gait and mobility: Secondary | ICD-10-CM | POA: Diagnosis not present

## 2017-11-18 DIAGNOSIS — Z800 Family history of malignant neoplasm of digestive organs: Secondary | ICD-10-CM | POA: Diagnosis not present

## 2017-11-18 DIAGNOSIS — I5032 Chronic diastolic (congestive) heart failure: Secondary | ICD-10-CM | POA: Diagnosis not present

## 2017-11-18 DIAGNOSIS — N184 Chronic kidney disease, stage 4 (severe): Secondary | ICD-10-CM | POA: Diagnosis not present

## 2017-11-18 DIAGNOSIS — K449 Diaphragmatic hernia without obstruction or gangrene: Secondary | ICD-10-CM | POA: Diagnosis not present

## 2017-11-19 DIAGNOSIS — K449 Diaphragmatic hernia without obstruction or gangrene: Secondary | ICD-10-CM | POA: Diagnosis not present

## 2017-11-19 DIAGNOSIS — R1031 Right lower quadrant pain: Secondary | ICD-10-CM | POA: Diagnosis not present

## 2017-11-19 DIAGNOSIS — Z833 Family history of diabetes mellitus: Secondary | ICD-10-CM | POA: Diagnosis not present

## 2017-11-19 DIAGNOSIS — Z800 Family history of malignant neoplasm of digestive organs: Secondary | ICD-10-CM | POA: Diagnosis not present

## 2017-11-19 DIAGNOSIS — K529 Noninfective gastroenteritis and colitis, unspecified: Secondary | ICD-10-CM | POA: Diagnosis not present

## 2017-11-19 DIAGNOSIS — N179 Acute kidney failure, unspecified: Secondary | ICD-10-CM | POA: Diagnosis not present

## 2017-11-20 DIAGNOSIS — K449 Diaphragmatic hernia without obstruction or gangrene: Secondary | ICD-10-CM | POA: Diagnosis not present

## 2017-11-20 DIAGNOSIS — Z800 Family history of malignant neoplasm of digestive organs: Secondary | ICD-10-CM | POA: Diagnosis not present

## 2017-11-20 DIAGNOSIS — N179 Acute kidney failure, unspecified: Secondary | ICD-10-CM | POA: Diagnosis not present

## 2017-11-20 DIAGNOSIS — K529 Noninfective gastroenteritis and colitis, unspecified: Secondary | ICD-10-CM | POA: Diagnosis not present

## 2017-11-20 DIAGNOSIS — R1031 Right lower quadrant pain: Secondary | ICD-10-CM | POA: Diagnosis not present

## 2017-11-20 DIAGNOSIS — Z833 Family history of diabetes mellitus: Secondary | ICD-10-CM | POA: Diagnosis not present

## 2017-11-21 DIAGNOSIS — I130 Hypertensive heart and chronic kidney disease with heart failure and stage 1 through stage 4 chronic kidney disease, or unspecified chronic kidney disease: Secondary | ICD-10-CM | POA: Diagnosis not present

## 2017-11-21 DIAGNOSIS — J441 Chronic obstructive pulmonary disease with (acute) exacerbation: Secondary | ICD-10-CM | POA: Diagnosis not present

## 2017-11-21 DIAGNOSIS — Z7982 Long term (current) use of aspirin: Secondary | ICD-10-CM | POA: Diagnosis not present

## 2017-11-21 DIAGNOSIS — Z7951 Long term (current) use of inhaled steroids: Secondary | ICD-10-CM | POA: Diagnosis not present

## 2017-11-21 DIAGNOSIS — E785 Hyperlipidemia, unspecified: Secondary | ICD-10-CM | POA: Diagnosis not present

## 2017-11-21 DIAGNOSIS — I5022 Chronic systolic (congestive) heart failure: Secondary | ICD-10-CM | POA: Diagnosis not present

## 2017-11-21 DIAGNOSIS — K449 Diaphragmatic hernia without obstruction or gangrene: Secondary | ICD-10-CM | POA: Diagnosis not present

## 2017-11-21 DIAGNOSIS — J449 Chronic obstructive pulmonary disease, unspecified: Secondary | ICD-10-CM | POA: Diagnosis not present

## 2017-11-21 DIAGNOSIS — N184 Chronic kidney disease, stage 4 (severe): Secondary | ICD-10-CM | POA: Diagnosis not present

## 2017-11-23 DIAGNOSIS — J440 Chronic obstructive pulmonary disease with acute lower respiratory infection: Secondary | ICD-10-CM | POA: Diagnosis not present

## 2017-11-25 DIAGNOSIS — N184 Chronic kidney disease, stage 4 (severe): Secondary | ICD-10-CM | POA: Diagnosis not present

## 2017-11-25 DIAGNOSIS — K449 Diaphragmatic hernia without obstruction or gangrene: Secondary | ICD-10-CM | POA: Diagnosis not present

## 2017-11-25 DIAGNOSIS — J449 Chronic obstructive pulmonary disease, unspecified: Secondary | ICD-10-CM | POA: Diagnosis not present

## 2017-11-25 DIAGNOSIS — Z7982 Long term (current) use of aspirin: Secondary | ICD-10-CM | POA: Diagnosis not present

## 2017-11-25 DIAGNOSIS — Z7951 Long term (current) use of inhaled steroids: Secondary | ICD-10-CM | POA: Diagnosis not present

## 2017-11-25 DIAGNOSIS — E785 Hyperlipidemia, unspecified: Secondary | ICD-10-CM | POA: Diagnosis not present

## 2017-11-25 DIAGNOSIS — I130 Hypertensive heart and chronic kidney disease with heart failure and stage 1 through stage 4 chronic kidney disease, or unspecified chronic kidney disease: Secondary | ICD-10-CM | POA: Diagnosis not present

## 2017-11-25 DIAGNOSIS — I5022 Chronic systolic (congestive) heart failure: Secondary | ICD-10-CM | POA: Diagnosis not present

## 2017-11-26 DIAGNOSIS — E785 Hyperlipidemia, unspecified: Secondary | ICD-10-CM | POA: Diagnosis not present

## 2017-11-26 DIAGNOSIS — K449 Diaphragmatic hernia without obstruction or gangrene: Secondary | ICD-10-CM | POA: Diagnosis not present

## 2017-11-26 DIAGNOSIS — N184 Chronic kidney disease, stage 4 (severe): Secondary | ICD-10-CM | POA: Diagnosis not present

## 2017-11-26 DIAGNOSIS — Z7951 Long term (current) use of inhaled steroids: Secondary | ICD-10-CM | POA: Diagnosis not present

## 2017-11-26 DIAGNOSIS — I5022 Chronic systolic (congestive) heart failure: Secondary | ICD-10-CM | POA: Diagnosis not present

## 2017-11-26 DIAGNOSIS — I130 Hypertensive heart and chronic kidney disease with heart failure and stage 1 through stage 4 chronic kidney disease, or unspecified chronic kidney disease: Secondary | ICD-10-CM | POA: Diagnosis not present

## 2017-11-26 DIAGNOSIS — J449 Chronic obstructive pulmonary disease, unspecified: Secondary | ICD-10-CM | POA: Diagnosis not present

## 2017-11-26 DIAGNOSIS — Z7982 Long term (current) use of aspirin: Secondary | ICD-10-CM | POA: Diagnosis not present

## 2017-11-27 DIAGNOSIS — I5022 Chronic systolic (congestive) heart failure: Secondary | ICD-10-CM | POA: Diagnosis not present

## 2017-11-27 DIAGNOSIS — N184 Chronic kidney disease, stage 4 (severe): Secondary | ICD-10-CM | POA: Diagnosis not present

## 2017-11-27 DIAGNOSIS — K449 Diaphragmatic hernia without obstruction or gangrene: Secondary | ICD-10-CM | POA: Diagnosis not present

## 2017-11-27 DIAGNOSIS — Z7982 Long term (current) use of aspirin: Secondary | ICD-10-CM | POA: Diagnosis not present

## 2017-11-27 DIAGNOSIS — J449 Chronic obstructive pulmonary disease, unspecified: Secondary | ICD-10-CM | POA: Diagnosis not present

## 2017-11-27 DIAGNOSIS — Z7951 Long term (current) use of inhaled steroids: Secondary | ICD-10-CM | POA: Diagnosis not present

## 2017-11-27 DIAGNOSIS — I130 Hypertensive heart and chronic kidney disease with heart failure and stage 1 through stage 4 chronic kidney disease, or unspecified chronic kidney disease: Secondary | ICD-10-CM | POA: Diagnosis not present

## 2017-11-27 DIAGNOSIS — E785 Hyperlipidemia, unspecified: Secondary | ICD-10-CM | POA: Diagnosis not present

## 2017-11-28 DIAGNOSIS — K210 Gastro-esophageal reflux disease with esophagitis: Secondary | ICD-10-CM | POA: Diagnosis not present

## 2017-11-28 DIAGNOSIS — K810 Acute cholecystitis: Secondary | ICD-10-CM | POA: Diagnosis not present

## 2017-11-28 DIAGNOSIS — Z6820 Body mass index (BMI) 20.0-20.9, adult: Secondary | ICD-10-CM | POA: Diagnosis not present

## 2017-11-29 DIAGNOSIS — E785 Hyperlipidemia, unspecified: Secondary | ICD-10-CM | POA: Diagnosis not present

## 2017-11-29 DIAGNOSIS — K449 Diaphragmatic hernia without obstruction or gangrene: Secondary | ICD-10-CM | POA: Diagnosis not present

## 2017-11-29 DIAGNOSIS — Z7982 Long term (current) use of aspirin: Secondary | ICD-10-CM | POA: Diagnosis not present

## 2017-11-29 DIAGNOSIS — J449 Chronic obstructive pulmonary disease, unspecified: Secondary | ICD-10-CM | POA: Diagnosis not present

## 2017-11-29 DIAGNOSIS — I5022 Chronic systolic (congestive) heart failure: Secondary | ICD-10-CM | POA: Diagnosis not present

## 2017-11-29 DIAGNOSIS — N184 Chronic kidney disease, stage 4 (severe): Secondary | ICD-10-CM | POA: Diagnosis not present

## 2017-11-29 DIAGNOSIS — I130 Hypertensive heart and chronic kidney disease with heart failure and stage 1 through stage 4 chronic kidney disease, or unspecified chronic kidney disease: Secondary | ICD-10-CM | POA: Diagnosis not present

## 2017-11-29 DIAGNOSIS — Z7951 Long term (current) use of inhaled steroids: Secondary | ICD-10-CM | POA: Diagnosis not present

## 2017-12-01 DIAGNOSIS — E785 Hyperlipidemia, unspecified: Secondary | ICD-10-CM | POA: Diagnosis not present

## 2017-12-01 DIAGNOSIS — N184 Chronic kidney disease, stage 4 (severe): Secondary | ICD-10-CM | POA: Diagnosis not present

## 2017-12-01 DIAGNOSIS — I130 Hypertensive heart and chronic kidney disease with heart failure and stage 1 through stage 4 chronic kidney disease, or unspecified chronic kidney disease: Secondary | ICD-10-CM | POA: Diagnosis not present

## 2017-12-01 DIAGNOSIS — Z7982 Long term (current) use of aspirin: Secondary | ICD-10-CM | POA: Diagnosis not present

## 2017-12-01 DIAGNOSIS — J449 Chronic obstructive pulmonary disease, unspecified: Secondary | ICD-10-CM | POA: Diagnosis not present

## 2017-12-01 DIAGNOSIS — K449 Diaphragmatic hernia without obstruction or gangrene: Secondary | ICD-10-CM | POA: Diagnosis not present

## 2017-12-01 DIAGNOSIS — Z7951 Long term (current) use of inhaled steroids: Secondary | ICD-10-CM | POA: Diagnosis not present

## 2017-12-01 DIAGNOSIS — I5022 Chronic systolic (congestive) heart failure: Secondary | ICD-10-CM | POA: Diagnosis not present

## 2017-12-03 DIAGNOSIS — I130 Hypertensive heart and chronic kidney disease with heart failure and stage 1 through stage 4 chronic kidney disease, or unspecified chronic kidney disease: Secondary | ICD-10-CM | POA: Diagnosis not present

## 2017-12-03 DIAGNOSIS — I5022 Chronic systolic (congestive) heart failure: Secondary | ICD-10-CM | POA: Diagnosis not present

## 2017-12-03 DIAGNOSIS — K449 Diaphragmatic hernia without obstruction or gangrene: Secondary | ICD-10-CM | POA: Diagnosis not present

## 2017-12-03 DIAGNOSIS — N184 Chronic kidney disease, stage 4 (severe): Secondary | ICD-10-CM | POA: Diagnosis not present

## 2017-12-03 DIAGNOSIS — J449 Chronic obstructive pulmonary disease, unspecified: Secondary | ICD-10-CM | POA: Diagnosis not present

## 2017-12-03 DIAGNOSIS — E785 Hyperlipidemia, unspecified: Secondary | ICD-10-CM | POA: Diagnosis not present

## 2017-12-03 DIAGNOSIS — Z7951 Long term (current) use of inhaled steroids: Secondary | ICD-10-CM | POA: Diagnosis not present

## 2017-12-03 DIAGNOSIS — Z7982 Long term (current) use of aspirin: Secondary | ICD-10-CM | POA: Diagnosis not present

## 2017-12-09 DIAGNOSIS — Z7951 Long term (current) use of inhaled steroids: Secondary | ICD-10-CM | POA: Diagnosis not present

## 2017-12-09 DIAGNOSIS — I130 Hypertensive heart and chronic kidney disease with heart failure and stage 1 through stage 4 chronic kidney disease, or unspecified chronic kidney disease: Secondary | ICD-10-CM | POA: Diagnosis not present

## 2017-12-09 DIAGNOSIS — N184 Chronic kidney disease, stage 4 (severe): Secondary | ICD-10-CM | POA: Diagnosis not present

## 2017-12-09 DIAGNOSIS — J449 Chronic obstructive pulmonary disease, unspecified: Secondary | ICD-10-CM | POA: Diagnosis not present

## 2017-12-09 DIAGNOSIS — I5022 Chronic systolic (congestive) heart failure: Secondary | ICD-10-CM | POA: Diagnosis not present

## 2017-12-09 DIAGNOSIS — K449 Diaphragmatic hernia without obstruction or gangrene: Secondary | ICD-10-CM | POA: Diagnosis not present

## 2017-12-09 DIAGNOSIS — E785 Hyperlipidemia, unspecified: Secondary | ICD-10-CM | POA: Diagnosis not present

## 2017-12-09 DIAGNOSIS — Z7982 Long term (current) use of aspirin: Secondary | ICD-10-CM | POA: Diagnosis not present

## 2017-12-11 DIAGNOSIS — J449 Chronic obstructive pulmonary disease, unspecified: Secondary | ICD-10-CM | POA: Diagnosis not present

## 2017-12-11 DIAGNOSIS — N184 Chronic kidney disease, stage 4 (severe): Secondary | ICD-10-CM | POA: Diagnosis not present

## 2017-12-11 DIAGNOSIS — Z7951 Long term (current) use of inhaled steroids: Secondary | ICD-10-CM | POA: Diagnosis not present

## 2017-12-11 DIAGNOSIS — I5022 Chronic systolic (congestive) heart failure: Secondary | ICD-10-CM | POA: Diagnosis not present

## 2017-12-11 DIAGNOSIS — K449 Diaphragmatic hernia without obstruction or gangrene: Secondary | ICD-10-CM | POA: Diagnosis not present

## 2017-12-11 DIAGNOSIS — I130 Hypertensive heart and chronic kidney disease with heart failure and stage 1 through stage 4 chronic kidney disease, or unspecified chronic kidney disease: Secondary | ICD-10-CM | POA: Diagnosis not present

## 2017-12-11 DIAGNOSIS — Z7982 Long term (current) use of aspirin: Secondary | ICD-10-CM | POA: Diagnosis not present

## 2017-12-11 DIAGNOSIS — E785 Hyperlipidemia, unspecified: Secondary | ICD-10-CM | POA: Diagnosis not present

## 2017-12-21 DIAGNOSIS — J440 Chronic obstructive pulmonary disease with acute lower respiratory infection: Secondary | ICD-10-CM | POA: Diagnosis not present

## 2018-01-05 DIAGNOSIS — M7918 Myalgia, other site: Secondary | ICD-10-CM | POA: Diagnosis not present

## 2018-01-05 DIAGNOSIS — Z6820 Body mass index (BMI) 20.0-20.9, adult: Secondary | ICD-10-CM | POA: Diagnosis not present

## 2018-01-21 DIAGNOSIS — J440 Chronic obstructive pulmonary disease with acute lower respiratory infection: Secondary | ICD-10-CM | POA: Diagnosis not present

## 2018-02-20 DIAGNOSIS — J440 Chronic obstructive pulmonary disease with acute lower respiratory infection: Secondary | ICD-10-CM | POA: Diagnosis not present

## 2018-03-16 ENCOUNTER — Encounter: Payer: Self-pay | Admitting: Gastroenterology

## 2018-04-07 ENCOUNTER — Encounter: Payer: Self-pay | Admitting: *Deleted

## 2018-04-07 ENCOUNTER — Encounter: Payer: Self-pay | Admitting: Cardiology

## 2018-04-07 ENCOUNTER — Ambulatory Visit (INDEPENDENT_AMBULATORY_CARE_PROVIDER_SITE_OTHER): Payer: Medicare Other | Admitting: Cardiology

## 2018-04-07 VITALS — BP 135/76 | HR 87 | Ht 72.0 in | Wt 147.6 lb

## 2018-04-07 DIAGNOSIS — I5022 Chronic systolic (congestive) heart failure: Secondary | ICD-10-CM

## 2018-04-07 DIAGNOSIS — I10 Essential (primary) hypertension: Secondary | ICD-10-CM | POA: Diagnosis not present

## 2018-04-07 MED ORDER — FUROSEMIDE 20 MG PO TABS: ORAL_TABLET | ORAL | 1 refills | Status: DC

## 2018-04-07 MED ORDER — LISINOPRIL 20 MG PO TABS: 20.0000 mg | ORAL_TABLET | Freq: Every day | ORAL | 1 refills | Status: DC

## 2018-04-07 NOTE — Progress Notes (Signed)
Clinical Summary Ronnie [name] is a 72 y.o.male seen as new consult, referred by NP Sharol Roussel for chronic systolic heart failure.    1. Chronic systolic HF - records from 2014 indicate admission with acute HF. Echo at that time showed VLEF 35-40%, grade II diastolic dysfunction - he refused cath at that time - never followed up as outpatient.    - remains SOB just walking room to room. Occasional chest pain. Can have some exertional chest pain.  - recent orthostatic symptoms.   2. HTN - notes indicate prior history of medication noncompliance.    Past Medical History:  Diagnosis Date  . Dyslipidemia   . Hypertension   . Shortness of breath      No Known Allergies   Current Outpatient Medications  Medication Sig Dispense Refill  . albuterol (PROVENTIL HFA;VENTOLIN HFA) 108 (90 BASE) MCG/ACT inhaler Inhale 2 puffs into the lungs 2 (two) times daily as needed for wheezing or shortness of breath.    Marland Kitchen aspirin EC 81 MG EC tablet Take 1 tablet (81 mg total) by mouth daily.    . carvedilol (COREG) 6.25 MG tablet Take 1 tablet (6.25 mg total) by mouth 2 (two) times daily with a meal. 60 tablet 0  . hydrocortisone cream 1 % Apply 1 application topically daily. Apply to spots on face    . lisinopril-hydrochlorothiazide (PRINZIDE,ZESTORETIC) 20-12.5 MG per tablet Take 1 tablet by mouth daily. 30 tablet 0  . nicotine (NICODERM CQ - DOSED IN MG/24 HOURS) 21 mg/24hr patch Place 1 patch onto the skin daily. 28 patch 0  . simvastatin (ZOCOR) 40 MG tablet Take 1 tablet (40 mg total) by mouth every evening. 30 tablet 0   No current facility-administered medications for this visit.      Past Surgical History:  Procedure Laterality Date  . DENTAL SURGERY       No Known Allergies    No family history on file.   Social History Ronnie [name] reports that he has been smoking cigarettes.  He has a 25.00 pack-year smoking history. He has never used smokeless tobacco. Ronnie [name]  reports that he does not drink alcohol.   Review of Systems CONSTITUTIONAL: No weight loss, fever, chills, weakness or fatigue.  HEENT: Eyes: No visual loss, blurred vision, double vision or yellow sclerae.No hearing loss, sneezing, congestion, runny nose or sore throat.  SKIN: No rash or itching.  CARDIOVASCULAR: per hpi RESPIRATORY: per hpi GASTROINTESTINAL: No anorexia, nausea, vomiting or diarrhea. No abdominal pain or blood.  GENITOURINARY: No burning on urination, no polyuria NEUROLOGICAL: No headache, dizziness, syncope, paralysis, ataxia, numbness or tingling in the extremities. No change in bowel or bladder control.  MUSCULOSKELETAL: No muscle, back pain, joint pain or stiffness.  LYMPHATICS: No enlarged nodes. No history of splenectomy.  PSYCHIATRIC: No history of depression or anxiety.  ENDOCRINOLOGIC: No reports of sweating, cold or heat intolerance. No polyuria or polydipsia.  Marland Kitchen   Physical Examination Vitals:   04/07/18 1430 04/07/18 1438  BP: (!) 148/84 135/76  Pulse: 88 87  SpO2: 100%    Vitals:   04/07/18 1430  Weight: 147 lb 9.6 oz (67 kg)  Height: 6' (1.829 m)    Gen: resting comfortably, no acute distress HEENT: no scleral icterus, pupils equal round and reactive, no palptable cervical adenopathy,  CV: RRR, no m/rg, no jvd Resp: Clear to auscultation bilaterally GI: abdomen is soft, non-tender, non-distended, normal bowel sounds, no hepatosplenomegaly MSK: extremities are warm, no edema.  Skin: warm, no rash Neuro:  no focal deficits Psych: appropriate affect   Diagnostic Studies  06/2013 echo Study Conclusions  - Left ventricle: The cavity size was at the upper limits of normal. Wall thickness was increased in a pattern of mild LVH. There was mild concentric hypertrophy. Systolic function was moderately reduced. The estimated ejection fraction was in the range of 35% to 40%. Diffuse hypokinesis. Features are consistent with a  pseudonormal left ventricular filling pattern, with concomitant abnormal relaxation and increased filling pressure (grade 2 diastolic dysfunction). Doppler parameters are consistent with elevated mean left atrial filling pressure. - Aortic valve: Trileaflet; mildly thickened leaflets. - Mitral valve: Mild to moderate regurgitation. - Right ventricle: Systolic pressure was increased. - Atrial septum: No defect or patent foramen ovale was identified. - Pulmonary arteries: PA peak pressure: 74mm Hg (S). Impressions:   Assessment and Plan  1. Chronic sysotlic HF - medical therapy limited due to significant orthostatic symptoms - stop daily HCTZ, start lasix just prn. Continue coreg and lisnopril at current doses, hopefully off daily diuretic will improve his symptoms and allow [place] to titrate further his other meds - will need repeat echo over next visit.  - obtain labs, pending results consider changing to entresto next visit. Consider cath which he previously declined years ago - EKG today show SR, chronic ST/T changes   2. HTN - reasonable control given his recent orthostatic symptoms. Stopping HCTZ as reported above.   F/u 3 weeks   Arnoldo Lenis, M.D.

## 2018-04-07 NOTE — Patient Instructions (Addendum)
Your physician recommends that you schedule a follow-up appointment in: [place] 04/28/18 @ 240 pm   Your physician has recommended you make the following change in your medication:   STOP PRINZIDE   START LISINOPRIL 20 MG DAILY   START LASIX 20 MG AS NEEDED FOR SWELLING    Thank you for choosing [facility]!!

## 2018-04-13 ENCOUNTER — Encounter: Payer: Self-pay | Admitting: Cardiology

## 2018-04-28 ENCOUNTER — Encounter: Payer: Self-pay | Admitting: Cardiology

## 2018-04-28 ENCOUNTER — Ambulatory Visit (INDEPENDENT_AMBULATORY_CARE_PROVIDER_SITE_OTHER): Payer: Medicare Other | Admitting: Cardiology

## 2018-04-28 ENCOUNTER — Other Ambulatory Visit: Payer: Self-pay

## 2018-04-28 VITALS — BP 171/109 | HR 123 | Ht 72.0 in | Wt 144.0 lb

## 2018-04-28 DIAGNOSIS — I5022 Chronic systolic (congestive) heart failure: Secondary | ICD-10-CM | POA: Diagnosis not present

## 2018-04-28 DIAGNOSIS — R002 Palpitations: Secondary | ICD-10-CM

## 2018-04-28 DIAGNOSIS — I10 Essential (primary) hypertension: Secondary | ICD-10-CM | POA: Diagnosis not present

## 2018-04-28 MED ORDER — LISINOPRIL 10 MG PO TABS: 10.0000 mg | ORAL_TABLET | Freq: Every day | ORAL | 1 refills | Status: DC

## 2018-04-28 MED ORDER — CARVEDILOL 6.25 MG PO TABS: 6.2500 mg | ORAL_TABLET | Freq: Two times a day (BID) | ORAL | 1 refills | Status: DC

## 2018-04-28 NOTE — Progress Notes (Signed)
Clinical Summary Ronnie [name] is a 72 y.o.male seen today for follow up of the following medical problems.    1. Chronic systolic HF - records from 2014 indicate admission with acute HF. Echo at that time showed VLEF 35-40%, grade II diastolic dysfunction - he refused cath at that time - never followed up as outpatient.    - SOB off and on. No recent edema.    2. HTN - notes indicate prior history of medication noncompliance - he reports he has been taking meds regularly, however has not taken yet today.    3. Palpitations - recent elevated heart rates. Most often at night time - compliant with meds.   4. Chest pain - sharp pain. Left chest, 10/10 in severe. Can occur at rest or with activity. Better with leaning forward - lasts a few minutes. +SOB.   Past Medical History:  Diagnosis Date  . Dyslipidemia   . Hypertension   . Shortness of breath      No Known Allergies   Current Outpatient Medications  Medication Sig Dispense Refill  . albuterol (PROVENTIL HFA;VENTOLIN HFA) 108 (90 BASE) MCG/ACT inhaler Inhale 2 puffs into the lungs 2 (two) times daily as needed for wheezing or shortness of breath.    Marland Kitchen aspirin EC 81 MG EC tablet Take 1 tablet (81 mg total) by mouth daily.    . carvedilol (COREG) 6.25 MG tablet Take 1 tablet (6.25 mg total) by mouth 2 (two) times daily with a meal. 60 tablet 0  . furosemide (LASIX) 20 MG tablet TAKE 1 TABLET AS NEEDED FOR SWELLING 90 tablet 1  . isosorbide mononitrate (IMDUR) 30 MG 24 hr tablet Take 1 tablet by mouth daily.  0  . lisinopril (PRINIVIL,ZESTRIL) 20 MG tablet Take 1 tablet (20 mg total) by mouth daily. 90 tablet 1  . simvastatin (ZOCOR) 40 MG tablet Take 1 tablet (40 mg total) by mouth every evening. 30 tablet 0   No current facility-administered medications for this visit.      Past Surgical History:  Procedure Laterality Date  . DENTAL SURGERY       No Known Allergies    No family history on  file.   Social History Ronnie [name] reports that he has been smoking cigarettes.  He has a 25.00 pack-year smoking history. He has never used smokeless tobacco. Ronnie [name] reports that he does not drink alcohol.   Review of Systems CONSTITUTIONAL: No weight loss, fever, chills, weakness or fatigue.  HEENT: Eyes: No visual loss, blurred vision, double vision or yellow sclerae.No hearing loss, sneezing, congestion, runny nose or sore throat.  SKIN: No rash or itching.  CARDIOVASCULAR: per hpi RESPIRATORY: No shortness of breath, cough or sputum.  GASTROINTESTINAL: No anorexia, nausea, vomiting or diarrhea. No abdominal pain or blood.  GENITOURINARY: No burning on urination, no polyuria NEUROLOGICAL: No headache, dizziness, syncope, paralysis, ataxia, numbness or tingling in the extremities. No change in bowel or bladder control.  MUSCULOSKELETAL: No muscle, back pain, joint pain or stiffness.  LYMPHATICS: No enlarged nodes. No history of splenectomy.  PSYCHIATRIC: No history of depression or anxiety.  ENDOCRINOLOGIC: No reports of sweating, cold or heat intolerance. No polyuria or polydipsia.  Marland Kitchen   Physical Examination Vitals:   04/28/18 1429  BP: (!) 171/109  Pulse: (!) 123  SpO2: 99%   Vitals:   04/28/18 1429  Weight: 144 lb (65.3 kg)  Height: 6' (1.829 m)    Gen: resting comfortably, no acute  distress HEENT: no scleral icterus, pupils equal round and reactive, no palptable cervical adenopathy,  CV: tachy 110, no m/r/g, no jvd Resp: Clear to auscultation bilaterally GI: abdomen is soft, non-tender, non-distended, normal bowel sounds, no hepatosplenomegaly MSK: extremities are warm, no edema.  Skin: warm, no rash Neuro:  no focal deficits Psych: appropriate affect   Diagnostic Studies  06/2013 echo Study Conclusions  - Left ventricle: The cavity size was at the upper limits of normal. Wall thickness was increased in a pattern of mild LVH. There was mild  concentric hypertrophy. Systolic function was moderately reduced. The estimated ejection fraction was in the range of 35% to 40%. Diffuse hypokinesis. Features are consistent with a pseudonormal left ventricular filling pattern, with concomitant abnormal relaxation and increased filling pressure (grade 2 diastolic dysfunction). Doppler parameters are consistent with elevated mean left atrial filling pressure. - Aortic valve: Trileaflet; mildly thickened leaflets. - Mitral valve: Mild to moderate regurgitation. - Right ventricle: Systolic pressure was increased. - Atrial septum: No defect or patent foramen ovale was identified. - Pulmonary arteries: PA peak pressure: 38mm Hg (S). Impressions:      Assessment and Plan   1. Chronic sysotlic HF - medical therapy limited due to significant orthostatic symptoms - we will need to repeat his echo, pending function consider cath if he is willing to agree though renal function may be an obstacle.  - pending echo may consider hydral/nitrates, titration of beta blocker. Limited ACE/ARB/ARNI/aldactone options given his poor renal function.   2. HTN - bp elevated but has not taken meds yet today, continue to monitor at this time.  - with Cr of 2, lower lisinopril to 10mg  daily.     3. Palpitations - EKG shows sinus tach 110, LVH with strain pattern - f/u echo, tachycardia may be a sign of worsening LV function. Continue beta blocker, encouraged regular compliance with meds  4. Chest pain - atypical symptoms. Pending echo, likely pursue cath based on LV function though renal function may be an obstacle given his last Cr of 2.          Arnoldo Lenis, M.D.,

## 2018-04-28 NOTE — Patient Instructions (Signed)
Your physician recommends that you schedule a follow-up appointment in: [place] has recommended you make the following change in your medication:   CHANGE LISINOPRIL 10 DAILY   Your physician has requested that you have an echocardiogram. Echocardiography is a painless test that uses sound waves to create images of your heart. It provides your doctor with information about the size and shape of your heart and how well your heart's chambers and valves are working. This procedure takes approximately one hour. There are no restrictions for this procedure.  Thank you for choosing [facility]!!

## 2018-04-29 ENCOUNTER — Other Ambulatory Visit: Payer: Medicare Other

## 2018-05-05 ENCOUNTER — Telehealth: Payer: Self-pay | Admitting: *Deleted

## 2018-05-05 NOTE — Telephone Encounter (Signed)
LM on private VM of Ronnie [name] with [facility]

## 2018-05-05 NOTE — Telephone Encounter (Signed)
Home health has been checking on pt via phone with medications - says pt is confused on what he is supposed to be taking - gave current med list as of 7/23 [place] (nurse) would like Dr Harl Bowie to ok an order to start home health for medication management/CHF and PT - pt has transportation issues as well

## 2018-05-05 NOTE — Telephone Encounter (Signed)
Ok to provide the neccesary order   J Mishawn Hemann MD

## 2018-05-06 ENCOUNTER — Telehealth: Payer: Self-pay | Admitting: Cardiology

## 2018-05-06 ENCOUNTER — Encounter: Payer: Self-pay | Admitting: Cardiology

## 2018-05-07 ENCOUNTER — Other Ambulatory Visit: Payer: Self-pay

## 2018-05-07 ENCOUNTER — Emergency Department (HOSPITAL_COMMUNITY): Payer: Medicare Other

## 2018-05-07 ENCOUNTER — Encounter (HOSPITAL_COMMUNITY): Payer: Self-pay | Admitting: Emergency Medicine

## 2018-05-07 ENCOUNTER — Inpatient Hospital Stay (HOSPITAL_COMMUNITY)
Admission: EM | Admit: 2018-05-07 | Discharge: 2018-05-12 | DRG: 305 | Disposition: A | Discharge status: Home / Self Care | Payer: Medicare Other | Attending: Internal Medicine | Admitting: Internal Medicine

## 2018-05-07 DIAGNOSIS — Z681 Body mass index (BMI) 19 or less, adult: Secondary | ICD-10-CM

## 2018-05-07 DIAGNOSIS — D509 Iron deficiency anemia, unspecified: Secondary | ICD-10-CM | POA: Diagnosis present

## 2018-05-07 DIAGNOSIS — R778 Other specified abnormalities of plasma proteins: Secondary | ICD-10-CM | POA: Diagnosis present

## 2018-05-07 DIAGNOSIS — N183 Chronic kidney disease, stage 3 unspecified: Secondary | ICD-10-CM | POA: Diagnosis present

## 2018-05-07 DIAGNOSIS — R748 Abnormal levels of other serum enzymes: Secondary | ICD-10-CM | POA: Diagnosis not present

## 2018-05-07 DIAGNOSIS — I10 Essential (primary) hypertension: Secondary | ICD-10-CM

## 2018-05-07 DIAGNOSIS — R079 Chest pain, unspecified: Secondary | ICD-10-CM | POA: Diagnosis present

## 2018-05-07 DIAGNOSIS — F329 Major depressive disorder, single episode, unspecified: Secondary | ICD-10-CM | POA: Diagnosis not present

## 2018-05-07 DIAGNOSIS — E785 Hyperlipidemia, unspecified: Secondary | ICD-10-CM | POA: Diagnosis not present

## 2018-05-07 DIAGNOSIS — Z9119 Patient's noncompliance with other medical treatment and regimen: Secondary | ICD-10-CM

## 2018-05-07 DIAGNOSIS — Z87891 Personal history of nicotine dependence: Secondary | ICD-10-CM | POA: Diagnosis not present

## 2018-05-07 DIAGNOSIS — D631 Anemia in chronic kidney disease: Secondary | ICD-10-CM | POA: Diagnosis not present

## 2018-05-07 DIAGNOSIS — R040 Epistaxis: Secondary | ICD-10-CM | POA: Diagnosis not present

## 2018-05-07 DIAGNOSIS — R7989 Other specified abnormal findings of blood chemistry: Secondary | ICD-10-CM | POA: Diagnosis present

## 2018-05-07 DIAGNOSIS — I169 Hypertensive crisis, unspecified: Principal | ICD-10-CM | POA: Diagnosis present

## 2018-05-07 DIAGNOSIS — E440 Moderate protein-calorie malnutrition: Secondary | ICD-10-CM

## 2018-05-07 DIAGNOSIS — F419 Anxiety disorder, unspecified: Secondary | ICD-10-CM | POA: Diagnosis present

## 2018-05-07 DIAGNOSIS — Z7982 Long term (current) use of aspirin: Secondary | ICD-10-CM

## 2018-05-07 DIAGNOSIS — R1011 Right upper quadrant pain: Secondary | ICD-10-CM

## 2018-05-07 DIAGNOSIS — Z9114 Patient's other noncompliance with medication regimen: Secondary | ICD-10-CM | POA: Diagnosis not present

## 2018-05-07 DIAGNOSIS — R1013 Epigastric pain: Secondary | ICD-10-CM

## 2018-05-07 DIAGNOSIS — Z79899 Other long term (current) drug therapy: Secondary | ICD-10-CM | POA: Diagnosis not present

## 2018-05-07 DIAGNOSIS — I5042 Chronic combined systolic (congestive) and diastolic (congestive) heart failure: Secondary | ICD-10-CM | POA: Diagnosis not present

## 2018-05-07 DIAGNOSIS — D649 Anemia, unspecified: Secondary | ICD-10-CM | POA: Diagnosis present

## 2018-05-07 DIAGNOSIS — N179 Acute kidney failure, unspecified: Secondary | ICD-10-CM | POA: Diagnosis not present

## 2018-05-07 DIAGNOSIS — G8929 Other chronic pain: Secondary | ICD-10-CM

## 2018-05-07 DIAGNOSIS — I130 Hypertensive heart and chronic kidney disease with heart failure and stage 1 through stage 4 chronic kidney disease, or unspecified chronic kidney disease: Secondary | ICD-10-CM | POA: Diagnosis not present

## 2018-05-07 DIAGNOSIS — R0789 Other chest pain: Secondary | ICD-10-CM | POA: Diagnosis present

## 2018-05-07 DIAGNOSIS — N289 Disorder of kidney and ureter, unspecified: Secondary | ICD-10-CM

## 2018-05-07 HISTORY — DX: Chronic combined systolic (congestive) and diastolic (congestive) heart failure: I50.42

## 2018-05-07 LAB — COMPREHENSIVE METABOLIC PANEL
ALT: 14 U/L (ref 0–44)
AST: 35 U/L (ref 15–41)
Albumin: 3.7 g/dL (ref 3.5–5.0)
Alkaline Phosphatase: 85 U/L (ref 38–126)
Anion gap: 10 (ref 5–15)
BUN: 41 mg/dL — ABNORMAL HIGH (ref 8–23)
CHLORIDE: 105 mmol/L (ref 98–111)
CO2: 25 mmol/L (ref 22–32)
Calcium: 9.4 mg/dL (ref 8.9–10.3)
Creatinine, Ser: 2.33 mg/dL — ABNORMAL HIGH (ref 0.61–1.24)
GFR, EST AFRICAN AMERICAN: 30 mL/min — AB (ref >60)
GFR, EST NON AFRICAN AMERICAN: 26 mL/min — AB (ref >60)
Glucose, Bld: 107 mg/dL — ABNORMAL HIGH (ref 70–99)
POTASSIUM: 4.9 mmol/L (ref 3.5–5.1)
Sodium: 140 mmol/L (ref 135–145)
Total Bilirubin: 0.4 mg/dL (ref 0.3–1.2)
Total Protein: 7.8 g/dL (ref 6.5–8.1)

## 2018-05-07 LAB — BRAIN NATRIURETIC PEPTIDE: B NATRIURETIC PEPTIDE 5: 1030 pg/mL — AB (ref 0.0–100.0)

## 2018-05-07 LAB — CBC WITH DIFFERENTIAL/PLATELET
Basophils Absolute: 0 10*3/uL (ref 0.0–0.1)
Basophils Relative: 0 %
EOS ABS: 0.2 10*3/uL (ref 0.0–0.7)
EOS PCT: 3 %
HCT: 29.5 % — ABNORMAL LOW (ref 39.0–52.0)
Hemoglobin: 9.5 g/dL — ABNORMAL LOW (ref 13.0–17.0)
LYMPHS ABS: 1.8 10*3/uL (ref 0.7–4.0)
Lymphocytes Relative: 26 %
MCH: 29.2 pg (ref 26.0–34.0)
MCHC: 32.2 g/dL (ref 30.0–36.0)
MCV: 90.8 fL (ref 78.0–100.0)
Monocytes Absolute: 0.5 10*3/uL (ref 0.1–1.0)
Monocytes Relative: 8 %
Neutro Abs: 4.5 10*3/uL (ref 1.7–7.7)
Neutrophils Relative %: 63 %
PLATELETS: 344 10*3/uL (ref 150–400)
RBC: 3.25 MIL/uL — ABNORMAL LOW (ref 4.22–5.81)
RDW: 12.6 % (ref 11.5–15.5)
WBC: 7.1 10*3/uL (ref 4.0–10.5)

## 2018-05-07 LAB — TROPONIN I
TROPONIN I: 0.1 ng/mL — AB (ref <0.03)
Troponin I: 0.1 ng/mL (ref <0.03)

## 2018-05-07 LAB — LIPASE, BLOOD: LIPASE: 62 U/L — AB (ref 11–51)

## 2018-05-07 MED ORDER — ONDANSETRON HCL 4 MG/2ML IJ SOLN
4.0000 mg | Freq: Four times a day (QID) | INTRAMUSCULAR | Status: DC | PRN
  Administered 2018-05-08 – 2018-05-11 (×3): 4 mg via INTRAVENOUS
  Filled 2018-05-07 (×3): qty 2

## 2018-05-07 MED ORDER — ONDANSETRON HCL 4 MG PO TABS: 4.0000 mg | ORAL_TABLET | Freq: Four times a day (QID) | ORAL | Status: DC | PRN

## 2018-05-07 MED ORDER — ACETAMINOPHEN 650 MG RE SUPP: 650.0000 mg | Freq: Four times a day (QID) | RECTAL | Status: DC | PRN

## 2018-05-07 MED ORDER — ACETAMINOPHEN 325 MG PO TABS
650.0000 mg | ORAL_TABLET | Freq: Four times a day (QID) | ORAL | Status: DC | PRN
  Administered 2018-05-11: 650 mg via ORAL
  Filled 2018-05-07: qty 2

## 2018-05-07 MED ORDER — ENOXAPARIN SODIUM 30 MG/0.3ML ~~LOC~~ SOLN
30.0000 mg | SUBCUTANEOUS | Status: DC
  Administered 2018-05-08: 30 mg via SUBCUTANEOUS
  Filled 2018-05-07: qty 0.3

## 2018-05-07 NOTE — ED Triage Notes (Addendum)
Pt seen his NP today and was sent her due to HTN there. bp 145/100 in right arm in triage. Pt was given bp meds today. Pt a/o. Pt c/o headache, blurred vision. Pt vomiting daily for weeks due to cholecystitis but unable to do surgery due to "weak heart" per wife. 147/88 in left arm. Pt states he had cp earlier. Wife states NP spoke with Dr Harl Bowie and was told to send pt here

## 2018-05-07 NOTE — ED Provider Notes (Signed)
[facility] EMERGENCY DEPARTMENT Provider Note   CSN: [id number] Arrival date & time: 05/07/18  1639     History   Chief Complaint Chief Complaint  Patient presents with  . Hypertension    HPI Ronnie [name] is a 72 y.o. male.  The history is provided by the patient and the spouse.  He has history of hypertension, hyperlipidemia, combined systolic and diastolic heart failure and was sent to the ED because blood pressure was reported to be markedly elevated at 716 systolic.  He is not complaining of any headache.  He does have complaints of intermittent chest pain which do not seem to be related to exertion.  However, he does have chronic exertional dyspnea with shortness of breath coming with walking about 20 feet.  He does have orthopnea and paroxysmal nocturnal dyspnea.  Apparently, he has not been compliant with his medications.  He was diagnosed with cholelithiasis earlier this year, but was not able to have surgery because of his heart failure (hospitalization at [facility]).  Apparently, if he is not feeling well, he will not take his medications.  He does relate problems with anorexia.  He does not monitor his blood pressure at home.  Past Medical History:  Diagnosis Date  . Dyslipidemia   . Hypertension   . Shortness of breath     Patient Active Problem List   Diagnosis Date Noted  . Acute combined systolic and diastolic heart failure ([place]) 06/17/2013  . Other and unspecified hyperlipidemia 06/17/2013  . Essential hypertension, malignant 06/17/2013  . Chronic kidney disease, stage III (moderate) ([place]) 06/17/2013  . Accelerated hypertension 06/16/2013  . T wave inversion in EKG 06/16/2013  . Chronic combined systolic and diastolic CHF (congestive heart failure) ([place]) 06/16/2013    Past Surgical History:  Procedure Laterality Date  . DENTAL SURGERY          Home Medications    Prior to Admission medications   Medication Sig Start Date End Date Taking?  Authorizing Provider  albuterol (PROVENTIL HFA;VENTOLIN HFA) 108 (90 BASE) MCG/ACT inhaler Inhale 2 puffs into the lungs 2 (two) times daily as needed for wheezing or shortness of breath.    [provider]  aspirin EC 81 MG EC tablet Take 1 tablet (81 mg total) by mouth daily. 06/17/13   [name], MD  carvedilol (COREG) 6.25 MG tablet Take 1 tablet (6.25 mg total) by mouth 2 (two) times daily with a meal. 04/28/18   Arnoldo Lenis, MD  furosemide (LASIX) 20 MG tablet TAKE 1 TABLET AS NEEDED FOR SWELLING 04/07/18   Arnoldo Lenis, MD  isosorbide mononitrate (IMDUR) 30 MG 24 hr tablet Take 1 tablet by mouth daily. 01/03/18   [provider]  lisinopril (PRINIVIL,ZESTRIL) 10 MG tablet Take 1 tablet (10 mg total) by mouth daily. 04/28/18 07/27/18  Arnoldo Lenis, MD  pantoprazole (PROTONIX) 40 MG tablet Take 40 mg by mouth daily.    [provider]  ranitidine (ZANTAC) 150 MG tablet Take 150 mg by mouth 2 (two) times daily.    [provider]  simvastatin (ZOCOR) 40 MG tablet Take 1 tablet (40 mg total) by mouth every evening. 06/17/13   [name], MD    Family History History reviewed. No pertinent family history.  Social History Social History   Tobacco Use  . Smoking status: Former Smoker    Packs/day: 0.50    Years: 50.00    Pack years: 25.00    Types: Cigarettes  Last attempt to quit: 03/29/2018    Years since quitting: 0.1  . Smokeless tobacco: Never Used  Substance Use Topics  . Alcohol use: No  . Drug use: No     Allergies   Bee venom   Review of Systems Review of Systems  All other systems reviewed and are negative.    Physical Exam Updated Vital Signs BP (!) 145/100 (BP Location: Right Arm)   Pulse 90   Temp 98.3 F (36.8 C) (Oral)   Resp 18   SpO2 97%   Physical Exam  Nursing note and vitals reviewed.  72 year old male, resting comfortably and in no acute distress. Vital signs are significant for  mildly elevated blood pressure. Oxygen saturation is 97%, which is normal. Head is normocephalic and atraumatic. PERRLA, EOMI. Oropharynx is clear. Neck is nontender and supple without adenopathy or JVD. Back is nontender and there is no CVA tenderness. Lungs are clear without rales, wheezes, or rhonchi. Chest is nontender. Heart has regular rate and rhythm without murmur. Abdomen is soft, flat, with mild right upper quadrant tenderness.  There is no rebound or guarding.  There are no masses or hepatosplenomegaly and peristalsis is normoactive. Extremities have no cyanosis or edema, full range of motion is present. Skin is warm and dry without rash. Neurologic: Mental status is normal, cranial nerves are intact, there are no motor or sensory deficits.  ED Treatments / Results  Labs (all labs ordered are listed, but only abnormal results are displayed) Labs Reviewed  CBC WITH DIFFERENTIAL/PLATELET - Abnormal; Notable for the following components:      Result Value   RBC 3.25 (*)    Hemoglobin 9.5 (*)    HCT 29.5 (*)    All other components within normal limits  TROPONIN I - Abnormal; Notable for the following components:   Troponin I 0.10 (*)    All other components within normal limits  COMPREHENSIVE METABOLIC PANEL - Abnormal; Notable for the following components:   Glucose, Bld 107 (*)    BUN 41 (*)    Creatinine, Ser 2.33 (*)    GFR calc non Af Amer 26 (*)    GFR calc Af Amer 30 (*)    All other components within normal limits  BRAIN NATRIURETIC PEPTIDE - Abnormal; Notable for the following components:   B Natriuretic Peptide 1,030.0 (*)    All other components within normal limits  LIPASE, BLOOD - Abnormal; Notable for the following components:   Lipase 62 (*)    All other components within normal limits  TROPONIN I    EKG EKG Interpretation  Date/Time:  Thursday May 07 2018 17:55:53 EDT Ventricular Rate:  77 PR Interval:    QRS Duration: 88 QT  Interval:  398 QTC Calculation: 451 R Axis:   34 Text Interpretation:  Sinus rhythm LVH with secondary repolarization abnormality Baseline wander in lead(s) V2 V5 When compared with ECG of 06/20/2013, T wave inversion is more pronounced in inferior and anterolateral leads Confirmed by Delora Fuel ([id number]) on 05/07/2018 6:03:33 PM  Note: ECG is unchanged from ECG on 04/28/2018 (in Epic, but not in MUSE).  Radiology Dg Chest 2 View  Result Date: 05/07/2018 CLINICAL DATA:  Generalized chest pain since this morning, shortness of breath, history COPD, hypertension, former smoker EXAM: CHEST - 2 VIEW COMPARISON:  11/16/2017 FINDINGS: Upper normal heart size. Mediastinal contours and pulmonary vascularity normal. Lungs clear. No pleural effusion or pneumothorax. Bones unremarkable. IMPRESSION: No acute abnormalities. Electronically Signed  By: Lavonia Dana M.D.   On: 05/07/2018 18:18    Procedures Procedures   Medications Ordered in ED Medications - No data to display   Initial Impression / Assessment and Plan / ED Course  I have reviewed the triage vital signs and the nursing notes.  Pertinent labs & imaging results that were available during my care of the patient were reviewed by me and considered in my medical decision making (see chart for details).  Markedly elevated blood pressure which is only mildly elevated in the ED.  Old records are reviewed, and he just started seeing a cardiologist 1 month ago.  Prior to that, last visit in the [facility] health system was in 2014 at which time echocardiogram showed ejection fraction 35-40% with grade 2 diastolic dysfunction.  He has labs from June which showed creatinine of 2, and ECG which showed diffuse T wave inversions.  Will check screening labs here.  I believe he will need to monitor blood pressure at home so that decisions are made on adequate number of readings.  I have accessed radiology reports through [facility].  He had ultrasound of gallbladder  which showed sludge, and CT of abdomen and pelvis that showed some gallbladder wall thickening.  Follow-up CT scan showed decreased gallbladder wall thickening.  HIDA scan showed patent biliary tree.  All of the studies were in January 2019.  Laboratory work-up shows renal insufficiency which is slightly worse than what had been present on labs patient brought with him from June.  Also, he is noted to have normochromic normocytic anemia.  Troponin has come back slightly elevated at 0.10.  This is likely demand ischemia, but he will need to be admitted for serial troponins.  BNP has come back significantly elevated consistent with his known history of combined systolic and diastolic heart failure.  Chest x-ray shows no evidence of acute heart failure.  Patient initially wanted to sign out [place], but, after discussing the risks, he agrees to stay.  Case is discussed with Dr. Olevia Bowens, of [facility] hospitalists, who agrees to admit the patient.  Final Clinical Impressions(s) / ED Diagnoses   Final diagnoses:  Elevated troponin I level  Essential hypertension  Renal insufficiency  Normochromic normocytic anemia  Chronic combined systolic and diastolic heart failure [facility])    ED Discharge Orders    None       Delora Fuel, MD 61/60/73 2031

## 2018-05-07 NOTE — ED Notes (Addendum)
CRITICAL VALUE ALERT  Critical Value:  Troponin 0.10  Date & Time Notied:  05/07/18 & 1949 hrs  Provider Notified:  Dr. Roxanne Mins  Orders Received/Actions taken: N/A

## 2018-05-07 NOTE — H&P (Signed)
History and Physical    Ronnie [name] NWG:[id number] DOB: 1946/10/07 DOA: 05/07/2018  PCP: Wyatt Haste, NP   Patient coming from: Home.  I have personally briefly reviewed patient's old medical records in [place]  Chief Complaint: Uncontrolled blood pressure and chest pain.  HPI: Ronnie [name] is a 72 y.o. male with medical history significant of hyperlipidemia, hypertension, chronic combined systolic and diastolic heart failure who is referred to the emergency department by his PCP due to uncontrolled hypertension. He also has been having episodes of pressure like CP associated with dyspnea, lightheadedness and nausea. CP does not radiate and sometimes worsens with activity. He denies PND, orthopnea or recent pitting edema of the lower extremities. He also complains of dyspepsia and occasionally mildly loose BM. He has been told he has gallbladder issues, but could not elaborate on this. No fever, chills, sore throat, hemoptysis, constipation, melena or hematochezia. No dysuria, gross hematuria or frequency, but states that sneezing or yawning might make him drip briefly. No heat or cold intolerance. No polyuria, polydipsia or polyphagia. No skin rashes.   ED Course: His initial vital signs temperature 98.3 F, pulse 90, respirations 18, blood pressure 145/100 mmHg and O2 sat 97% on room air.  No medications were given in the ED.  His EKG was sinus rhythm with LVH.  T wave inversion is more pronounced than on previous tracings.  Troponin level has been 0.1 ng/mL x 2 so far. BNP was 1030.0 pg/mL.  His WBC was 7.1 with a normal differential, hemoglobin 9.5 g/dL and platelets 344.  CMP shows normal electrolytes.  Glucose 107, BUN 41 and creatinine 2.33 mg/dL.  His hepatic function tests are within normal limits.  Lipase was 62 units/L.  His chest radiograph did not show any acute cardiopulmonary pathology.  Review of Systems: As per HPI otherwise 10 point review of systems  negative.   Past Medical History:  Diagnosis Date  . Dyslipidemia   . Hypertension   . Shortness of breath     Past Surgical History:  Procedure Laterality Date  . DENTAL SURGERY       reports that he quit smoking about 5 weeks ago. His smoking use included cigarettes. He has a 25.00 pack-year smoking history. He has never used smokeless tobacco. He reports that he does not drink alcohol or use drugs.  Allergies  Allergen Reactions  . Bee Venom Shortness Of Breath and Swelling   Family History  Problem Relation Age of Onset  . Heart disease Mother   . Stomach cancer Father     Prior to Admission medications   Medication Sig Start Date End Date Taking? Authorizing Provider  albuterol (PROVENTIL HFA;VENTOLIN HFA) 108 (90 BASE) MCG/ACT inhaler Inhale 2 puffs into the lungs 2 (two) times daily as needed for wheezing or shortness of breath.    [provider]  aspirin EC 81 MG EC tablet Take 1 tablet (81 mg total) by mouth daily. 06/17/13   [name], MD  carvedilol (COREG) 6.25 MG tablet Take 1 tablet (6.25 mg total) by mouth 2 (two) times daily with a meal. 04/28/18   Arnoldo Lenis, MD  furosemide (LASIX) 20 MG tablet TAKE 1 TABLET AS NEEDED FOR SWELLING 04/07/18   Arnoldo Lenis, MD  isosorbide mononitrate (IMDUR) 30 MG 24 hr tablet Take 1 tablet by mouth daily. 01/03/18   [provider]  lisinopril (PRINIVIL,ZESTRIL) 10 MG tablet Take 1 tablet (10 mg total) by mouth daily.  04/28/18 07/27/18  Arnoldo Lenis, MD  pantoprazole (PROTONIX) 40 MG tablet Take 40 mg by mouth daily.    [provider]  ranitidine (ZANTAC) 150 MG tablet Take 150 mg by mouth 2 (two) times daily.    [provider]  simvastatin (ZOCOR) 40 MG tablet Take 1 tablet (40 mg total) by mouth every evening. 06/17/13   [name], MD    Physical Exam: Vitals:   05/07/18 2100 05/07/18 2130 05/07/18 2145 05/07/18 2200  BP: (!) 170/112 (!) 171/89  (!) 165/98    Pulse: 84 90 87 88  Resp: (!) 21 (!) 22 (!) 25 (!) 22  Temp:      TempSrc:      SpO2: 97% 95% 98% 97%    Constitutional: NAD, calm, comfortable Eyes: PERRL, lids and conjunctivae normal ENMT: Mucous membranes are moist. Posterior pharynx clear of any exudate or lesions. Neck: normal, supple, no masses, no thyromegaly Respiratory: clear to auscultation bilaterally, no wheezing, no crackles. Normal respiratory effort. No accessory muscle use.  Cardiovascular: Regular rate and rhythm, no murmurs / rubs / gallops. No extremity edema. 2+ pedal pulses. No carotid bruits.  Abdomen: Soft, no tenderness, no masses palpated. No hepatosplenomegaly. Bowel sounds positive.  Musculoskeletal: no clubbing / cyanosis. Good ROM, no contractures. Normal muscle tone.  Skin: no rashes, lesions, ulcers on limited dermatological examination. Neurologic: CN 2-12 grossly intact. Sensation intact, DTR normal. Strength 5/5 in all 4.  Psychiatric: Normal judgment and insight. Alert and oriented x 4. Normal mood.   Labs on Admission: I have personally reviewed following labs and imaging studies  CBC: Recent Labs  Lab 05/07/18 1800  WBC 7.1  NEUTROABS 4.5  HGB 9.5*  HCT 29.5*  MCV 90.8  PLT 409   Basic Metabolic Panel: Recent Labs  Lab 05/07/18 1800  NA 140  K 4.9  CL 105  CO2 25  GLUCOSE 107*  BUN 41*  CREATININE 2.33*  CALCIUM 9.4   GFR: Estimated Creatinine Clearance: 26.5 mL/min (A) (by C-G formula based on SCr of 2.33 mg/dL (H)). Liver Function Tests: Recent Labs  Lab 05/07/18 1800  AST 35  ALT 14  ALKPHOS 85  BILITOT 0.4  PROT 7.8  ALBUMIN 3.7   Recent Labs  Lab 05/07/18 1800  LIPASE 62*   No results for input(s): AMMONIA in the last 168 hours. Coagulation Profile: No results for input(s): INR, PROTIME in the last 168 hours. Cardiac Enzymes: Recent Labs  Lab 05/07/18 1800 05/07/18 2109  TROPONINI 0.10* 0.10*   BNP (last 3 results) No results for input(s): PROBNP in  the last 8760 hours. HbA1C: No results for input(s): HGBA1C in the last 72 hours. CBG: No results for input(s): GLUCAP in the last 168 hours. Lipid Profile: No results for input(s): CHOL, HDL, LDLCALC, TRIG, CHOLHDL, LDLDIRECT in the last 72 hours. Thyroid Function Tests: No results for input(s): TSH, T4TOTAL, FREET4, T3FREE, THYROIDAB in the last 72 hours. Anemia Panel: No results for input(s): VITAMINB12, FOLATE, FERRITIN, TIBC, IRON, RETICCTPCT in the last 72 hours. Urine analysis:    Component Value Date/Time   COLORURINE YELLOW 06/15/2013 2121   APPEARANCEUR CLEAR 06/15/2013 2121   LABSPEC 1.014 06/15/2013 2121   PHURINE 5.5 06/15/2013 2121   GLUCOSEU NEGATIVE 06/15/2013 2121   HGBUR NEGATIVE 06/15/2013 2121   [place] NEGATIVE 06/15/2013 2121   [place] NEGATIVE 06/15/2013 2121   PROTEINUR 100 (A) 06/15/2013 2121   UROBILINOGEN 0.2 06/15/2013 2121   NITRITE NEGATIVE 06/15/2013 2121  LEUKOCYTESUR NEGATIVE 06/15/2013 2121    Radiological Exams on Admission: Dg Chest 2 View  Result Date: 05/07/2018 CLINICAL DATA:  Generalized chest pain since this morning, shortness of breath, history COPD, hypertension, former smoker EXAM: CHEST - 2 VIEW COMPARISON:  11/16/2017 FINDINGS: Upper normal heart size. Mediastinal contours and pulmonary vascularity normal. Lungs clear. No pleural effusion or pneumothorax. Bones unremarkable. IMPRESSION: No acute abnormalities. Electronically Signed   By: Lavonia Dana M.D.   On: 05/07/2018 18:18    EKG: Independently reviewed.  Vent. rate 77 BPM PR interval * ms QRS duration 88 ms QT/QTc 398/451 ms P-R-T axes 56 34 246 Sinus rhythm LVH with secondary repolarization abnormality Baseline wander in lead(s) V2 V5 T wave inversion is more marked on the anterolateral leads now.  Assessment/Plan Principal Problem:   Elevated troponin   Chest pain No complaints of chest pain time. Observation/telemetry. Trend troponin level. Check  echocardiogram. Request cardiology evaluation in a.m. The patient may benefit from stress test or cardiac cath. However, there has been history of noncompliance in the past. Should follow-up closely with cardiology as an outpatient  Active Problems:   Chronic combined systolic and diastolic CHF (congestive heart failure) ([place]) No signs of decompensation at this time. Continue carvedilol, lisinopril and as needed nitroglycerin. Check echocardiogram in a.m.    Hyperlipidemia Continue simvastatin 40 mg p.o. daily. Check hepatic function tests as needed. Fasting lipid follow-up as an outpatient.    Essential hypertension, malignant Continue carvedilol 6.25 mg p.o. twice daily. Continue lisinopril 10 mg p.o. daily. Also taking furosemide as needed for edema. Monitor blood pressure, heart rate, renal function electrolytes.    Chronic kidney disease, stage III (moderate) (HCC) This limits options and medication up titration of CHF treatment. Monitor renal function electrolytes.    Anemia Unremarkable anemia panel 5 years ago. Check anemia panel in a.m. Check stool guaiac. Monitor hematocrit and hemoglobin.   DVT prophylaxis: Lovenox SQ. Code Status: Full code. Family Communication:  Disposition Plan: Observation for troponin level trending and echocardiogram. Consults called: Routine cardiology consult. Admission status: Observation/telemetry.   Reubin Milan MD [facility] Pager [phone].  If 7PM-7AM, please contact night-coverage [email or web address] Password [facility]  05/07/2018, 10:28 PM

## 2018-05-07 NOTE — ED Notes (Signed)
CRITICAL VALUE ALERT  Critical Value:  Troponin 0.10  Date & Time Notied:  05/07/18 & 2222 hrs  Provider Notified: Dr. Roxanne Mins  Orders Received/Actions taken: N/A

## 2018-05-08 ENCOUNTER — Observation Stay (HOSPITAL_COMMUNITY): Payer: Medicare Other

## 2018-05-08 ENCOUNTER — Observation Stay (HOSPITAL_BASED_OUTPATIENT_CLINIC_OR_DEPARTMENT_OTHER): Payer: Medicare Other

## 2018-05-08 ENCOUNTER — Encounter (HOSPITAL_COMMUNITY): Payer: Self-pay | Admitting: Internal Medicine

## 2018-05-08 ENCOUNTER — Telehealth: Payer: Self-pay | Admitting: Cardiology

## 2018-05-08 DIAGNOSIS — E440 Moderate protein-calorie malnutrition: Secondary | ICD-10-CM

## 2018-05-08 DIAGNOSIS — I340 Nonrheumatic mitral (valve) insufficiency: Secondary | ICD-10-CM

## 2018-05-08 DIAGNOSIS — R748 Abnormal levels of other serum enzymes: Secondary | ICD-10-CM

## 2018-05-08 DIAGNOSIS — I169 Hypertensive crisis, unspecified: Secondary | ICD-10-CM | POA: Diagnosis not present

## 2018-05-08 LAB — BASIC METABOLIC PANEL
ANION GAP: 8 (ref 5–15)
BUN: 38 mg/dL — AB (ref 8–23)
CHLORIDE: 101 mmol/L (ref 98–111)
CO2: 25 mmol/L (ref 22–32)
Calcium: 8.8 mg/dL — ABNORMAL LOW (ref 8.9–10.3)
Creatinine, Ser: 2.28 mg/dL — ABNORMAL HIGH (ref 0.61–1.24)
GFR calc Af Amer: 31 mL/min — ABNORMAL LOW (ref >60)
GFR, EST NON AFRICAN AMERICAN: 27 mL/min — AB (ref >60)
GLUCOSE: 161 mg/dL — AB (ref 70–99)
POTASSIUM: 4.3 mmol/L (ref 3.5–5.1)
Sodium: 134 mmol/L — ABNORMAL LOW (ref 135–145)

## 2018-05-08 LAB — ECHOCARDIOGRAM COMPLETE
HEIGHTINCHES: 72 in
Weight: 2270.4 oz

## 2018-05-08 LAB — IRON AND TIBC
Iron: 82 ug/dL (ref 45–182)
Saturation Ratios: 30 % (ref 17.9–39.5)
TIBC: 277 ug/dL (ref 250–450)
UIBC: 195 ug/dL

## 2018-05-08 LAB — RETICULOCYTES
RBC.: 3.25 MIL/uL — AB (ref 4.22–5.81)
RETIC CT PCT: 1.1 % (ref 0.4–3.1)
Retic Count, Absolute: 35.8 10*3/uL (ref 19.0–186.0)

## 2018-05-08 LAB — CBC
HEMATOCRIT: 27.8 % — AB (ref 39.0–52.0)
HEMOGLOBIN: 9 g/dL — AB (ref 13.0–17.0)
MCH: 29.2 pg (ref 26.0–34.0)
MCHC: 32.4 g/dL (ref 30.0–36.0)
MCV: 90.3 fL (ref 78.0–100.0)
Platelets: 286 10*3/uL (ref 150–400)
RBC: 3.08 MIL/uL — ABNORMAL LOW (ref 4.22–5.81)
RDW: 12.6 % (ref 11.5–15.5)
WBC: 7.1 10*3/uL (ref 4.0–10.5)

## 2018-05-08 LAB — FOLATE: Folate: 31 ng/mL (ref >5.9)

## 2018-05-08 LAB — TROPONIN I
Troponin I: 0.09 ng/mL (ref <0.03)
Troponin I: 0.1 ng/mL (ref <0.03)
Troponin I: 0.09 ng/mL (ref <0.03)

## 2018-05-08 LAB — PHOSPHORUS: PHOSPHORUS: 4.3 mg/dL (ref 2.5–4.6)

## 2018-05-08 LAB — FERRITIN: FERRITIN: 127 ng/mL (ref 24–336)

## 2018-05-08 LAB — VITAMIN B12: VITAMIN B 12: 653 pg/mL (ref 180–914)

## 2018-05-08 LAB — LIPASE, BLOOD: LIPASE: 55 U/L — AB (ref 11–51)

## 2018-05-08 MED ORDER — ISOSORBIDE MONONITRATE ER 60 MG PO TB24
30.0000 mg | ORAL_TABLET | Freq: Every day | ORAL | Status: DC
  Administered 2018-05-08 – 2018-05-12 (×5): 30 mg via ORAL
  Filled 2018-05-08 (×5): qty 1

## 2018-05-08 MED ORDER — ASPIRIN EC 81 MG PO TBEC
81.0000 mg | DELAYED_RELEASE_TABLET | Freq: Every day | ORAL | Status: DC
  Administered 2018-05-08 – 2018-05-12 (×5): 81 mg via ORAL
  Filled 2018-05-08 (×5): qty 1

## 2018-05-08 MED ORDER — SODIUM CHLORIDE 0.9 % IV SOLN
INTRAVENOUS | Status: AC
  Administered 2018-05-08: 17:00:00 via INTRAVENOUS

## 2018-05-08 MED ORDER — SIMVASTATIN 20 MG PO TABS
40.0000 mg | ORAL_TABLET | Freq: Every evening | ORAL | Status: DC
  Administered 2018-05-08 – 2018-05-12 (×5): 40 mg via ORAL
  Filled 2018-05-08 (×5): qty 2

## 2018-05-08 MED ORDER — HEPARIN SODIUM (PORCINE) 5000 UNIT/ML IJ SOLN
5000.0000 [IU] | Freq: Three times a day (TID) | INTRAMUSCULAR | Status: DC
  Administered 2018-05-08 – 2018-05-12 (×12): 5000 [IU] via SUBCUTANEOUS
  Filled 2018-05-08 (×14): qty 1

## 2018-05-08 MED ORDER — FAMOTIDINE 20 MG PO TABS
20.0000 mg | ORAL_TABLET | Freq: Every day | ORAL | Status: DC
Start: 2018-05-08 — End: 2018-05-13
  Administered 2018-05-08 – 2018-05-11 (×5): 20 mg via ORAL
  Filled 2018-05-08 (×6): qty 1

## 2018-05-08 MED ORDER — CARVEDILOL 3.125 MG PO TABS
6.2500 mg | ORAL_TABLET | Freq: Two times a day (BID) | ORAL | Status: DC
  Administered 2018-05-08 – 2018-05-10 (×6): 6.25 mg via ORAL
  Filled 2018-05-08 (×6): qty 2

## 2018-05-08 MED ORDER — HYDRALAZINE HCL 20 MG/ML IJ SOLN
10.0000 mg | INTRAMUSCULAR | Status: DC | PRN
  Administered 2018-05-09 – 2018-05-11 (×4): 10 mg via INTRAVENOUS
  Filled 2018-05-08 (×4): qty 1

## 2018-05-08 MED ORDER — METOPROLOL TARTRATE 5 MG/5ML IV SOLN
5.0000 mg | Freq: Once | INTRAVENOUS | Status: AC
  Administered 2018-05-08: 5 mg via INTRAVENOUS
  Filled 2018-05-08: qty 5

## 2018-05-08 MED ORDER — LISINOPRIL 10 MG PO TABS
10.0000 mg | ORAL_TABLET | Freq: Every day | ORAL | Status: DC
  Administered 2018-05-08: 10 mg via ORAL
  Filled 2018-05-08: qty 1

## 2018-05-08 MED ORDER — ENSURE ENLIVE PO LIQD
237.0000 mL | Freq: Two times a day (BID) | ORAL | Status: DC
  Administered 2018-05-09 – 2018-05-11 (×3): 237 mL via ORAL

## 2018-05-08 MED ORDER — FUROSEMIDE 40 MG PO TABS
40.0000 mg | ORAL_TABLET | Freq: Two times a day (BID) | ORAL | Status: DC
  Administered 2018-05-08: 40 mg via ORAL
  Filled 2018-05-08: qty 1

## 2018-05-08 MED ORDER — PANTOPRAZOLE SODIUM 40 MG PO TBEC
40.0000 mg | DELAYED_RELEASE_TABLET | Freq: Every day | ORAL | Status: DC
  Administered 2018-05-08 – 2018-05-11 (×4): 40 mg via ORAL
  Filled 2018-05-08 (×4): qty 1

## 2018-05-08 NOTE — Consult Note (Addendum)
Cardiology Consult    Patient ID: [id number]; [id number]; 01-Jul-1946   Admit date: 05/07/2018 Date of Consult: 05/08/2018  Primary Care Provider: Wyatt Haste, NP Primary Cardiologist: Carlyle Dolly, MD   Patient Profile    Ronnie [name] is a 72 y.o. male with past medical history of chronic combined systolic and diastolic CHF (EF 35 to 54% by echocardiogram in 2014), atypical chest pain, HTN, HLD, palpitations, Stage 3-4 CKD, and medication noncompliance who is being seen today for the evaluation of chest pain at the request of Dr. Olevia Bowens.   History of Present Illness    Ronnie [name] was recently evaluated by Dr. Harl Bowie on 04/28/2018 and reported intermittent dyspnea which had been unchanged over the past several years. He did report sharp chest discomfort which could occur at rest or with activity and was improved with leaning forward. An echocardiogram was ordered for further assessment with the possibility of pursuing a cardiac catheterization based on LV function and the trend of his creatinine.   He presented to [facility] ED on 05/07/2018 for evaluation of elevated BP, headaches, and vision changes. He had been evaluated by his PCP earlier in the day and SBP had been elevated to greater than 200 by their report. Did not take his medications that morning due to having a decreased appetite. He reports having chronic dyspnea on exertion over the past several years and denies any acute changes in this. He does report intermittent episodes of chest discomfort which typically occurs when he lays down at night to go to sleep. Also notes intermittent palpitations over the past several months which he noticed most recently when engaging in sexual activities with his wife. He denies any specific orthopnea, PND, or lower extremity edema. Is unable to weigh himself daily as his vision does not allow for him to read the weight on his scales. While he is married, he says he lives alone at  home. Awaiting approval for Home Health services.   He does report frequent episodes of dizziness and presyncope when changing positions. This actually led to a syncopal episode a few months back but he denies any recent symptoms.  BP was elevated to 145/100 upon arrival to the ED, peaking at 188/109 overnight. BP has improved to 155/98 on most recent check.  Initial labs showed WBC 7.1, Hgb 9.5 (at 10.5 in 03/2018), Na+ 140, K+ 4.9, and creatinine 2.33 (2.05 in 03/2018). Lipase at 62. AST and ALT WNL. BNP elevated to 1030. Initial and cyclic troponin values have been flat at 0.10, 0.10, and 0.09. CXR shows no acute abnormalities. EKG shows NSR, HR 77, with LVH and diffuse TWI along inferolateral leads which is similar to prior tracings.    Past Medical History:  Diagnosis Date  . Chronic combined systolic (congestive) and diastolic (congestive) heart failure (HCC)    a. EF 35 to 40% by echocardiogram in 2014 --> declined cath at that time and did not reestablish care with Cardiology until 04/2018  . Dyslipidemia   . Hypertension   . Shortness of breath     Past Surgical History:  Procedure Laterality Date  . DENTAL SURGERY       Home Medications:  Prior to Admission medications   Medication Sig Start Date End Date Taking? Authorizing Provider  albuterol (PROVENTIL HFA;VENTOLIN HFA) 108 (90 BASE) MCG/ACT inhaler Inhale 2 puffs into the lungs 2 (two) times daily as needed for wheezing or shortness of breath.   Yes [provider]  carvedilol (COREG) 6.25 MG tablet Take 1 tablet (6.25 mg total) by mouth 2 (two) times daily with a meal. 04/28/18  Yes Alda Gaultney, Alphonse Guild, MD  furosemide (LASIX) 20 MG tablet TAKE 1 TABLET AS NEEDED FOR SWELLING Patient taking differently: Take 40 mg by mouth 2 (two) times daily. TAKE 1 TABLET AS NEEDED FOR SWELLING 04/07/18  Yes Malyn Aytes, Alphonse Guild, MD  isosorbide mononitrate (IMDUR) 30 MG 24 hr tablet Take 1 tablet by mouth daily. 01/03/18  Yes [provider]  lisinopril (PRINIVIL,ZESTRIL) 10 MG tablet Take 1 tablet (10 mg total) by mouth daily. 04/28/18 07/27/18 Yes BranchAlphonse Guild, MD  pantoprazole (PROTONIX) 40 MG tablet Take 40 mg by mouth daily.   Yes [provider]  ranitidine (ZANTAC) 150 MG tablet Take 150 mg by mouth at bedtime.    Yes [provider]  aspirin EC 81 MG EC tablet Take 1 tablet (81 mg total) by mouth daily. 06/17/13   [name], MD  simvastatin (ZOCOR) 40 MG tablet Take 1 tablet (40 mg total) by mouth every evening. 06/17/13   [name], MD    Inpatient Medications: Scheduled Meds: . aspirin EC  81 mg Oral Daily  . carvedilol  6.25 mg Oral BID WC  . enoxaparin (LOVENOX) injection  30 mg Subcutaneous Q24H  . famotidine  20 mg Oral QHS  . feeding supplement (ENSURE ENLIVE)  237 mL Oral BID BM  . furosemide  40 mg Oral BID  . isosorbide mononitrate  30 mg Oral Daily  . lisinopril  10 mg Oral Daily  . pantoprazole  40 mg Oral Daily  . simvastatin  40 mg Oral QPM   Continuous Infusions:  PRN Meds: acetaminophen **OR** acetaminophen, hydrALAZINE, ondansetron **OR** ondansetron (ZOFRAN) IV  Allergies:    Allergies  Allergen Reactions  . Bee Venom Shortness Of Breath and Swelling    Social History:   Social History   Socioeconomic History  . Marital status: Married    Spouse name: Not on file  . Number of children: Not on file  . Years of education: Not on file  . Highest education level: Not on file  Occupational History  . Not on file  Social Needs  . Financial resource strain: Not on file  . Food insecurity:    Worry: Not on file    Inability: Not on file  . Transportation needs:    Medical: Not on file    Non-medical: Not on file  Tobacco Use  . Smoking status: Former Smoker    Packs/day: 0.50    Years: 50.00    Pack years: 25.00    Types: Cigarettes    Last attempt to quit: 03/29/2018    Years since quitting: 0.1  . Smokeless tobacco: Never Used    Substance and Sexual Activity  . Alcohol use: No  . Drug use: No  . Sexual activity: Not on file  Lifestyle  . Physical activity:    Days per week: Not on file    Minutes per session: Not on file  . Stress: Not on file  Relationships  . Social connections:    Talks on phone: Not on file    Gets together: Not on file    Attends religious service: Not on file    Active member of club or organization: Not on file    Attends meetings of clubs or organizations: Not on file    Relationship status: Not on file  . Intimate  partner violence:    Fear of current or ex partner: Not on file    Emotionally abused: Not on file    Physically abused: Not on file    Forced sexual activity: Not on file  Other Topics Concern  . Not on file  Social History Narrative  . Not on file     Family History:    Family History  Problem Relation Age of Onset  . Heart disease Mother   . Stomach cancer Father       Review of Systems    General:  No chills, fever, night sweats or weight changes.  Cardiovascular:  No edema, orthopnea, palpitations, paroxysmal nocturnal dyspnea. Positive for chest pain and dyspnea on exertion.  Dermatological: No rash, lesions/masses Respiratory: No cough, dyspnea Urologic: No hematuria, dysuria Abdominal:   No nausea, vomiting, diarrhea, bright red blood per rectum, melena, or hematemesis Neurologic:  No visual changes, wkns, changes in mental status. Positive for headaches and dizziness.   All other systems reviewed and are otherwise negative except as noted above.  Physical Exam/Data    Vitals:   05/07/18 2300 05/07/18 2349 05/08/18 0221 05/08/18 0619  BP: (!) 168/99 (!) 188/109  (!) 155/98  Pulse: 84 88  79  Resp: (!) 28     Temp:  98.4 F (36.9 C)  97.8 F (36.6 C)  TempSrc:  Oral  Oral  SpO2: 96% 99%  100%  Weight:  141 lb 14.4 oz (64.4 kg)    Height:   6' (1.829 m)     Intake/Output Summary (Last 24 hours) at 05/08/2018 1001 Last data filed at  05/08/2018 0017 Gross per 24 hour  Intake -  Output 250 ml  Net -250 ml   Filed Weights   05/07/18 2349  Weight: 141 lb 14.4 oz (64.4 kg)   Body mass index is 19.25 kg/m.   General: Pleasant, elderly African American male appearing in NAD Psych: Normal affect. Neuro: Alert and oriented X 3. Moves all extremities spontaneously. HEENT: Normal  Neck: Supple without bruits or JVD. Lungs:  Resp regular and unlabored, decreased along bases bilaterally. Heart: RRR no s3, s4, or murmurs. Abdomen: Soft, non-tender, non-distended, BS + x 4.  Extremities: No clubbing, cyanosis or lower extremity edema. DP/PT/Radials 2+ and equal bilaterally.   EKG:  The EKG was personally reviewed and demonstrates: NSR, HR 77, with LVH and diffuse TWI along inferolateral leads which is similar to prior tracings.    Labs/Studies     Relevant CV Studies:  Echocardiogram: 06/2013 Study Conclusions  - Left ventricle: The cavity size was at the upper limits of normal. Wall thickness was increased in a pattern of mild LVH. There was mild concentric hypertrophy. Systolic function was moderately reduced. The estimated ejection fraction was in the range of 35% to 40%. Diffuse hypokinesis. Features are consistent with a pseudonormal left ventricular filling pattern, with concomitant abnormal relaxation and increased filling pressure (grade 2 diastolic dysfunction). Doppler parameters are consistent with elevated mean left atrial filling pressure. - Aortic valve: Trileaflet; mildly thickened leaflets. - Mitral valve: Mild to moderate regurgitation. - Right ventricle: Systolic pressure was increased. - Atrial septum: No defect or patent foramen ovale was identified. - Pulmonary arteries: PA peak pressure: 55mm Hg (S). Impressions:  - The right ventricular systolic pressure was increased consistent with mild pulmonary hypertension.  Laboratory Data:  Chemistry Recent Labs    Lab 05/07/18 1800 05/08/18 0310  NA 140 134*  K 4.9 4.3  CL 105  101  CO2 25 25  GLUCOSE 107* 161*  BUN 41* 38*  CREATININE 2.33* 2.28*  CALCIUM 9.4 8.8*  GFRNONAA 26* 27*  GFRAA 30* 31*  ANIONGAP 10 8    Recent Labs  Lab 05/07/18 1800  PROT 7.8  ALBUMIN 3.7  AST 35  ALT 14  ALKPHOS 85  BILITOT 0.4   Hematology Recent Labs  Lab 05/07/18 1800 05/08/18 0310 05/08/18 0925  WBC 7.1 7.1  --   RBC 3.25* 3.08* 3.25*  HGB 9.5* 9.0*  --   HCT 29.5* 27.8*  --   MCV 90.8 90.3  --   MCH 29.2 29.2  --   MCHC 32.2 32.4  --   RDW 12.6 12.6  --   PLT 344 286  --    Cardiac Enzymes Recent Labs  Lab 05/07/18 1800 05/07/18 2109 05/08/18 0310  TROPONINI 0.10* 0.10* 0.09*   No results for input(s): TROPIPOC in the last 168 hours.  BNP Recent Labs  Lab 05/07/18 1800  BNP 1,030.0*    DDimer No results for input(s): DDIMER in the last 168 hours.  Radiology/Studies:  Dg Chest 2 View  Result Date: 05/07/2018 CLINICAL DATA:  Generalized chest pain since this morning, shortness of breath, history COPD, hypertension, former smoker EXAM: CHEST - 2 VIEW COMPARISON:  11/16/2017 FINDINGS: Upper normal heart size. Mediastinal contours and pulmonary vascularity normal. Lungs clear. No pleural effusion or pneumothorax. Bones unremarkable. IMPRESSION: No acute abnormalities. Electronically Signed   By: Lavonia Dana M.D.   On: 05/07/2018 18:18    Assessment & Plan    1. Atypical Chest Pain/ Elevated Troponin  - In talking with the patient today, he denies any specific episodes of chest pain the day of admission. He did have episodes of a shooting pain when evaluated by Dr. Harl Bowie last month and reports he does have occasional symptoms at nighttime upon lying down to go to sleep. Denies any exertional chest pain.  - Cyclic troponin values have been flat at 0.10, 0.10, and 0.09. CXR shows no acute abnormalities. EKG shows NSR, HR 77, with LVH and diffuse TWI along inferolateral leads which  is similar to prior tracings.  - An echocardiogram has been ordered to assess LV function and wall motion. His enzyme trend is flat and most consistent with demand ischemia in the setting of accelerated HTN and stage IV CKD, not consistent with ACS.  Would consider an outpatient NST for ischemic evaluation prior to undergoing a cardiac catheterization if EF remains reduced due to his advanced kidney disease.  2. Chronic Combined Systolic and Diastolic CHF - EF was reduced to 35 to 40% by echocardiogram in 2014 and he refused further ischemic evaluation at that time and has not followed up with cardiology until recently. A repeat echocardiogram has been scheduled for today to reassess LV function and wall motion. - BNP was elevated to 1030 upon admission but CXR showed no acute abnormalities while he does have baseline dyspnea on exertion he denies any recent changes in this. No recent orthopnea, PND, or lower extremity edema. - Agree with continuing PTA Lasix 40 mg twice daily. Continue BB and ACE inhibitor.  3. Accelerated HTN - BP was elevated to 145/100 upon arrival to the ED, peaking at 188/109 overnight. BP has improved to 155/98 on most recent check. SBP was in the low-200's by his report when checked by his PCP yesterday.  He had not yet taken his morning medications due to feeling fatigued and not having  an appetite. - Would continue on Coreg 6.25 mg twice daily, Imdur 30 mg daily, and Lisinopril 10 mg daily.  Would defer continuation of ACE-I therapy with Nephrology once he establishes care as we do not have lab values over the past several years to compare to. Would not initiate on ARNI or Aldactone given variable CKD. If additional BP control is needed, consider Hydralazine but there would be concern for compliance with TID dosing.   4. HLD - Followed by PCP. Remains on Simvastatin 40 mg daily.  5. Stage 3-4 CKD - Creatinine was at 2.05 and 03/2018, elevated to 2.33 upon admission. He  needs to establish care with a Nephrologist in the outpatient setting.   For questions or updates, please contact [place] Please consult [email or web address] for contact info under Cardiology/STEMI.  Signed, Erma Heritage, PA-C 05/08/2018, 10:01 AM Pager: [phone]  Attedning note Patient seen and discussed with PA Ahmed Prima, I agree with her documentation above. 72 yo male history of chronic systolic HF LVEF 57-26% by echo in 2014 prevouisly refusing cath at that time, HTN, HL admitted with chest pain. I had received a call from his pcp regarding SBPs in the 200s at his clinic visit, report was that he had N/V and had not been keeping down his pills. . Notes indicate history of chloelithiasis at [facility] but did not have surgery due to his CHF.   WBC 7.1 Hgb 9.5 Plt 344 K 4.9 Cr 2.33 BUN 41 BNP 1000 Lipase 62  Trop 0.10-->0.10-->0.09-->0.10--> CXR no acute process EKG SR, LVH, diffuse TWIs likely strain pattern  Patient with history of systolic HF, previousy refused cath. Medical therapyl limited by orthostatic symptoms and compliance. Needs repeat echo. Would consider cath however his renal function at the time is prohibitive in the absence of high risk ACS. Mild flat trop in setting of systolic dysfunction, CKD, and severe HTN. Trend not consistent with ACS, EKG with LVH and strain pattern. Follow up echo, optimize medical therapy during this admisision.   Current medical therapy with coreg 6.25mg  bid, imdur 30, lisinopril 10. Would add hydralazine if LVEF remains low and bp's tolerate.    Carlyle Dolly MD

## 2018-05-08 NOTE — Telephone Encounter (Signed)
See 7/30 phone note

## 2018-05-08 NOTE — Progress Notes (Addendum)
PROGRESS NOTE    Ronnie [name]  BJS:[id number] DOB: 1945/10/17 DOA: 05/07/2018 PCP: Wyatt Haste, NP   Brief Narrative:   Ronnie [name] is a 72 y.o. male with medical history significant of hyperlipidemia, hypertension, chronic combined systolic and diastolic heart failure who is referred to the emergency department by his PCP due to uncontrolled hypertension. He also has been having episodes of pressure like CP associated with dyspnea, lightheadedness and nausea. He has been seen by Cardiology with no ACS noted, but rather poorly controlled BP. He appears to also have AKI vs. Progression of CKD. He continues to have some ongoing nausea and poor appetite.   Assessment & Plan:   Principal Problem:   Elevated troponin Active Problems:   Chest pain   Chronic combined systolic and diastolic CHF (congestive heart failure) (HCC)   Hyperlipidemia   Essential hypertension, malignant   Chronic kidney disease, stage III (moderate) (HCC)   Anemia   Malnutrition of moderate degree   1. Atypical chest pain with elevated troponin. Resolved with improvement in BP readings. [facility] evaluation. Likely related to demand ischemia with accelerated HTN. 2D echo pending. 2. AKI vs progression of CKD 3. Pt has symptoms of uremia and likely progression to CKD4. Will place on IVF and hold Lasix as well as lisinopril for now and monitor strict I/Os. Nephrology consultation for am. 3. Intractable nausea 2/2 to uremia. Pt on PPI and H2 blocker with no signs of PUD. Also, RUQ U/S with no acute findings. Will place on IVF.  4. Accelerated HTN. Resolved with current medications. Hold lisinopril for now due to AKI. 5. Anemia. Stable and likely chronic. 6. Dyslipidemia. Continue simvastatin daily.   DVT prophylaxis:Heparin Code Status: Full Family Communication: None at bedside Disposition Plan: Treatment of AKI and 2D echo evaluation.   Consultants:   Cardiology  Procedures:    2D echocardiogram pending  Antimicrobials:   None   Subjective: Patient seen and evaluated today with ongoing complaints of weakness, poor appetite, nausea, and vomiting.  Objective: Vitals:   05/07/18 2349 05/08/18 0221 05/08/18 0619 05/08/18 1409  BP: (!) 188/109  (!) 155/98 136/76  Pulse: 88  79 72  Resp:    18  Temp: 98.4 F (36.9 C)  97.8 F (36.6 C) 98.5 F (36.9 C)  TempSrc: Oral  Oral Oral  SpO2: 99%  100% 100%  Weight: 64.4 kg (141 lb 14.4 oz)     Height:  6' (1.829 m)      Intake/Output Summary (Last 24 hours) at 05/08/2018 1621 Last data filed at 05/08/2018 1200 Gross per 24 hour  Intake 0 ml  Output 250 ml  Net -250 ml   Filed Weights   05/07/18 2349  Weight: 64.4 kg (141 lb 14.4 oz)    Examination:  General exam: Appears calm and comfortable  Respiratory system: Clear to auscultation. Respiratory effort normal. Cardiovascular system: S1 & S2 heard, RRR. No JVD, murmurs, rubs, gallops or clicks. No pedal edema. Gastrointestinal system: Abdomen is nondistended, soft and minimally tender. No organomegaly or masses felt. Normal bowel sounds heard. Central nervous system: Alert and oriented. No focal neurological deficits. Extremities: Symmetric 5 x 5 power. Skin: No rashes, lesions or ulcers Psychiatry: Judgement and insight appear normal. Mood & affect appropriate.     Data Reviewed: I have personally reviewed following labs and imaging studies  CBC: Recent Labs  Lab 05/07/18 1800 05/08/18 0310  WBC 7.1 7.1  NEUTROABS 4.5  --   HGB  9.5* 9.0*  HCT 29.5* 27.8*  MCV 90.8 90.3  PLT 344 786   Basic Metabolic Panel: Recent Labs  Lab 05/07/18 1800 05/08/18 0310  NA 140 134*  K 4.9 4.3  CL 105 101  CO2 25 25  GLUCOSE 107* 161*  BUN 41* 38*  CREATININE 2.33* 2.28*  CALCIUM 9.4 8.8*   GFR: Estimated Creatinine Clearance: 26.7 mL/min (A) (by C-G formula based on SCr of 2.28 mg/dL (H)). Liver Function Tests: Recent Labs  Lab  05/07/18 1800  AST 35  ALT 14  ALKPHOS 85  BILITOT 0.4  PROT 7.8  ALBUMIN 3.7   Recent Labs  Lab 05/07/18 1800 05/08/18 0925  LIPASE 62* 55*   No results for input(s): AMMONIA in the last 168 hours. Coagulation Profile: No results for input(s): INR, PROTIME in the last 168 hours. Cardiac Enzymes: Recent Labs  Lab 05/07/18 1800 05/07/18 2109 05/08/18 0310 05/08/18 0925  TROPONINI 0.10* 0.10* 0.09* 0.10*   BNP (last 3 results) No results for input(s): PROBNP in the last 8760 hours. HbA1C: No results for input(s): HGBA1C in the last 72 hours. CBG: No results for input(s): GLUCAP in the last 168 hours. Lipid Profile: No results for input(s): CHOL, HDL, LDLCALC, TRIG, CHOLHDL, LDLDIRECT in the last 72 hours. Thyroid Function Tests: No results for input(s): TSH, T4TOTAL, FREET4, T3FREE, THYROIDAB in the last 72 hours. Anemia Panel: Recent Labs    05/08/18 0925  RETICCTPCT 1.1   Sepsis Labs: No results for input(s): PROCALCITON, LATICACIDVEN in the last 168 hours.  No results found for this or any previous visit (from the past 240 hour(s)).       Radiology Studies: Dg Chest 2 View  Result Date: 05/07/2018 CLINICAL DATA:  Generalized chest pain since this morning, shortness of breath, history COPD, hypertension, former smoker EXAM: CHEST - 2 VIEW COMPARISON:  11/16/2017 FINDINGS: Upper normal heart size. Mediastinal contours and pulmonary vascularity normal. Lungs clear. No pleural effusion or pneumothorax. Bones unremarkable. IMPRESSION: No acute abnormalities. Electronically Signed   By: Lavonia Dana M.D.   On: 05/07/2018 18:18   US Abdomen Limited Ruq  Result Date: 05/08/2018 CLINICAL DATA:  Chronic right upper quadrant pain EXAM: ULTRASOUND ABDOMEN LIMITED RIGHT UPPER QUADRANT COMPARISON:  CT 11/16/2017, HIDA scan 10/30/2017, ultrasound 10/30/2017 FINDINGS: Gallbladder: No gallstones or wall thickening visualized. No sonographic Murphy sign noted by sonographer.  Common bile duct: Diameter: 4 mm Liver: No focal lesion identified. Within normal limits in parenchymal echogenicity. Portal vein is patent on color Doppler imaging with normal direction of blood flow towards the liver. Incidental note made of multiple right renal cysts. Right kidney appears echogenic. Cysts measure up to 4.1 cm. IMPRESSION: 1. Negative for gallbladder stones or biliary dilatation 2. Echogenic right kidney suggests medical renal disease. There are multiple cysts in the right kidney. Electronically Signed   By: Donavan Foil M.D.   On: 05/08/2018 15:23        Scheduled Meds: . aspirin EC  81 mg Oral Daily  . carvedilol  6.25 mg Oral BID WC  . famotidine  20 mg Oral QHS  . feeding supplement (ENSURE ENLIVE)  237 mL Oral BID BM  . heparin injection (subcutaneous)  5,000 Units Subcutaneous Q8H  . isosorbide mononitrate  30 mg Oral Daily  . pantoprazole  40 mg Oral Daily  . simvastatin  40 mg Oral QPM   Continuous Infusions: . sodium chloride       LOS: 0 days    Time  spent: 30 minutes    Ronnie Coopman Darleen Crocker, DO [facility] Pager [phone]  If 7PM-7AM, please contact night-coverage [email or web address] Password [email or web address] 05/08/2018, 4:21 PM

## 2018-05-08 NOTE — Telephone Encounter (Signed)
Also faxed this correspondence to [facility] [place] - as per [facility] with [facility]

## 2018-05-08 NOTE — Progress Notes (Signed)
CRITICAL VALUE ALERT  Critical Value:  Troponin 0.10  Date & Time Notied:  05/08/2018  Provider Notified: Dr. Manuella Ghazi  Orders Received/Actions taken: Will continue to monitor patient.

## 2018-05-08 NOTE — Care Management Obs Status (Signed)
[place] NOTIFICATION   Patient Details  Name: Ronnie [name] MRN: [id number] Date of Birth: 1946-03-28   Medicare Observation Status Notification Given:  Yes    Sherald Barge, RN 05/08/2018, 3:32 PM

## 2018-05-08 NOTE — Telephone Encounter (Signed)
Ronnie [name] stated she has spoke with [name] in reference to getting orders for patient to have Physical therapy.  They would like for him to begin this Sunday at the [facility].

## 2018-05-08 NOTE — Progress Notes (Signed)
Initial Nutrition Assessment  DOCUMENTATION CODES:  Non-severe (moderate) malnutrition in context of chronic illness  INTERVENTION:  Continue Ensure Enlive po BID, each supplement provides 350 kcal and 20 grams of protein  Patients reported diet is laden with sodium & phosphorus. Received V/O to check phos lab.   Gave education/handouts regarding his high sodium diet.   Pt notes his poor functional status inhibits him from making any food at home->reliant on fast food/canned items.   NUTRITION DIAGNOSIS:  Moderate Malnutrition(Chronic) related to poor appetite, nausea, chronic illness(CHF, CKD3-4, HTN) as evidenced by mild/muscle fat depletion  GOAL:  Patient will meet greater than or equal to 90% of their needs   MONITOR:  PO intake  REASON FOR ASSESSMENT:  Malnutrition Screening Tool    ASSESSMENT:  72 y/o male PMHx HLD/HTN, CHF, CKD3-4. Referred by his PCP due to uncontrolled HTN and chest pain. Found to have elevated troponin in ED and admitted for observation/cardiology consult.   Patient seen d/t MST of 2-(poor appetite & unintentional weight loss)  Patient reports chronic generalized anorexia that has gradually worsened over a long period. He says he will get hungry, but when he goes to eat, he immediate loses all desire for the food. This happens with nearly all items. He does not eat any "meals"; he just snacks on small items during the day.   At [place]line, he reports very poor functional status. He lives alone but struggles with ADLs, cant cook for himself safely. He says he used to have an aide, but no longer does. He relies on fast food and canned/highly processed items for his intake. He does drink equate, when he has it, and drinks 1/day. He does not take vitamins.   Aside from anorexia, other symptoms include n/v (threw up 1x yesterday), Severe fatigue (has chronic anemia), SOB, taste changes. No constipation or diarrhea.   Pt reports UBW of "150 something". Per  chart, pt was 147.6 lbs 3 weeks ago. He presented this admission at 142 lbs, a loss of 3.7% bw).   Physical Exam: Mild generalized muscle wasting. Mild-moderate fat wasting. No edema.   Upon assessing the patients diet, it is laden with sodium. Also noted it is high in phosphorus. He notes he eats a large number of Pinto beans, fast food, dairy, coke (not as much as used to), sardines (had 3 cans day PTA),  Fast food, and other highly processed items that will be high in phos. He even does sprinkle "small amounts of salt on items".   RD reviewed with patient that his symptoms are consistent with those experienced in advanced kidney disease and heart disease. He is not doing himself any favors with his diet. RD left and returned with diet education on high sodium items. Reviewed it with him.   RD spoke with MD regarding RDs concerns of hyperphosphatemia and poor mobility affecting ability to eat a healthier diet.  Labs: h/h:9/27.8, Bun/creat:38/2.28, BNP >1000, BG: 107-161 Meds: Ensure Enlive, Lasix, H2RA, PPI,   Recent Labs  Lab 05/07/18 1800 05/08/18 0310  NA 140 134*  K 4.9 4.3  CL 105 101  CO2 25 25  BUN 41* 38*  CREATININE 2.33* 2.28*  CALCIUM 9.4 8.8*  GLUCOSE 107* 161*   NUTRITION - FOCUSED PHYSICAL EXAM:   Most Recent Value  Orbital Region  No depletion  Upper Arm Region  Mild depletion  Thoracic and Lumbar Region  Moderate depletion  Buccal Region  No depletion  Temple Region  Mild depletion  Clavicle Bone Region  Mild depletion  Clavicle and Acromion Bone Region  Mild depletion  Scapular Bone Region  Unable to assess  Dorsal Hand  No depletion  Patellar Region  No depletion  Anterior Thigh Region  No depletion  Posterior Calf Region  Mild depletion  Edema (RD Assessment)  None     Diet Order:   Diet Order           Diet Heart Room service appropriate? Yes; Fluid consistency: Thin  Diet effective now         EDUCATION NEEDS:  Education needs have been  addressed  Skin:  Skin Assessment: Reviewed RN Assessment  Last BM:  8/1  Height:  Ht Readings from Last 1 Encounters:  05/08/18 6' (1.829 m)   Weight:  Wt Readings from Last 1 Encounters:  05/07/18 141 lb 14.4 oz (64.4 kg)   Ideal Body Weight:  80.91 kg  BMI:  Body mass index is 19.25 kg/m.  Estimated Nutritional Needs:  Kcal:  1950-2150 kcals (30-33 kcal/kg bw) Protein:  84-97g Pro (1.3-1.5 g/kg bw) Fluid:  >2 L (30 ml/kg)  Burtis Junes RD, LDN, CNSC Clinical Nutrition Available Tues-Sat via Pager: [phone] 05/08/2018 2:33 PM

## 2018-05-08 NOTE — Progress Notes (Signed)
*  PRELIMINARY RESULTS* Echocardiogram 2D Echocardiogram has been performed.  Ronnie [name] 05/08/2018, 1:57 PM

## 2018-05-09 ENCOUNTER — Observation Stay (HOSPITAL_COMMUNITY): Payer: Medicare Other

## 2018-05-09 DIAGNOSIS — R0789 Other chest pain: Secondary | ICD-10-CM | POA: Diagnosis present

## 2018-05-09 DIAGNOSIS — Z79899 Other long term (current) drug therapy: Secondary | ICD-10-CM | POA: Diagnosis not present

## 2018-05-09 DIAGNOSIS — Z9119 Patient's noncompliance with other medical treatment and regimen: Secondary | ICD-10-CM | POA: Diagnosis not present

## 2018-05-09 DIAGNOSIS — F419 Anxiety disorder, unspecified: Secondary | ICD-10-CM | POA: Diagnosis present

## 2018-05-09 DIAGNOSIS — F329 Major depressive disorder, single episode, unspecified: Secondary | ICD-10-CM | POA: Diagnosis present

## 2018-05-09 DIAGNOSIS — I169 Hypertensive crisis, unspecified: Secondary | ICD-10-CM | POA: Diagnosis present

## 2018-05-09 DIAGNOSIS — D509 Iron deficiency anemia, unspecified: Secondary | ICD-10-CM | POA: Diagnosis present

## 2018-05-09 DIAGNOSIS — R040 Epistaxis: Secondary | ICD-10-CM | POA: Diagnosis not present

## 2018-05-09 DIAGNOSIS — N183 Chronic kidney disease, stage 3 (moderate): Secondary | ICD-10-CM | POA: Diagnosis present

## 2018-05-09 DIAGNOSIS — Z9114 Patient's other noncompliance with medication regimen: Secondary | ICD-10-CM | POA: Diagnosis not present

## 2018-05-09 DIAGNOSIS — N179 Acute kidney failure, unspecified: Secondary | ICD-10-CM | POA: Diagnosis present

## 2018-05-09 DIAGNOSIS — Z87891 Personal history of nicotine dependence: Secondary | ICD-10-CM | POA: Diagnosis not present

## 2018-05-09 DIAGNOSIS — D631 Anemia in chronic kidney disease: Secondary | ICD-10-CM | POA: Diagnosis present

## 2018-05-09 DIAGNOSIS — Z7982 Long term (current) use of aspirin: Secondary | ICD-10-CM | POA: Diagnosis not present

## 2018-05-09 DIAGNOSIS — E440 Moderate protein-calorie malnutrition: Secondary | ICD-10-CM | POA: Diagnosis present

## 2018-05-09 DIAGNOSIS — I130 Hypertensive heart and chronic kidney disease with heart failure and stage 1 through stage 4 chronic kidney disease, or unspecified chronic kidney disease: Secondary | ICD-10-CM | POA: Diagnosis present

## 2018-05-09 DIAGNOSIS — R748 Abnormal levels of other serum enzymes: Secondary | ICD-10-CM | POA: Diagnosis not present

## 2018-05-09 DIAGNOSIS — Z681 Body mass index (BMI) 19 or less, adult: Secondary | ICD-10-CM | POA: Diagnosis not present

## 2018-05-09 DIAGNOSIS — I5042 Chronic combined systolic (congestive) and diastolic (congestive) heart failure: Secondary | ICD-10-CM | POA: Diagnosis present

## 2018-05-09 DIAGNOSIS — E785 Hyperlipidemia, unspecified: Secondary | ICD-10-CM | POA: Diagnosis present

## 2018-05-09 LAB — CBC
HCT: 29.7 % — ABNORMAL LOW (ref 39.0–52.0)
HEMOGLOBIN: 9.5 g/dL — AB (ref 13.0–17.0)
MCH: 29.1 pg (ref 26.0–34.0)
MCHC: 32 g/dL (ref 30.0–36.0)
MCV: 90.8 fL (ref 78.0–100.0)
PLATELETS: 322 10*3/uL (ref 150–400)
RBC: 3.27 MIL/uL — AB (ref 4.22–5.81)
RDW: 12.8 % (ref 11.5–15.5)
WBC: 6 10*3/uL (ref 4.0–10.5)

## 2018-05-09 LAB — COMPREHENSIVE METABOLIC PANEL
ALK PHOS: 78 U/L (ref 38–126)
ALT: 12 U/L (ref 0–44)
AST: 29 U/L (ref 15–41)
Albumin: 3.2 g/dL — ABNORMAL LOW (ref 3.5–5.0)
Anion gap: 7 (ref 5–15)
BUN: 35 mg/dL — ABNORMAL HIGH (ref 8–23)
CALCIUM: 8.6 mg/dL — AB (ref 8.9–10.3)
CHLORIDE: 106 mmol/L (ref 98–111)
CO2: 26 mmol/L (ref 22–32)
CREATININE: 2.37 mg/dL — AB (ref 0.61–1.24)
GFR, EST AFRICAN AMERICAN: 30 mL/min — AB (ref >60)
GFR, EST NON AFRICAN AMERICAN: 26 mL/min — AB (ref >60)
Glucose, Bld: 105 mg/dL — ABNORMAL HIGH (ref 70–99)
Potassium: 4.1 mmol/L (ref 3.5–5.1)
Sodium: 139 mmol/L (ref 135–145)
Total Bilirubin: 0.5 mg/dL (ref 0.3–1.2)
Total Protein: 7.2 g/dL (ref 6.5–8.1)

## 2018-05-09 MED ORDER — ALPRAZOLAM 0.25 MG PO TABS
0.2500 mg | ORAL_TABLET | Freq: Once | ORAL | Status: AC
Start: 2018-05-09 — End: 2018-05-09
  Administered 2018-05-09: 0.25 mg via ORAL
  Filled 2018-05-09: qty 1

## 2018-05-09 MED ORDER — SODIUM CHLORIDE 0.9 % IV SOLN
INTRAVENOUS | Status: DC
Start: 2018-05-09 — End: 2018-05-11
  Administered 2018-05-09 – 2018-05-10 (×3): via INTRAVENOUS

## 2018-05-09 NOTE — Progress Notes (Signed)
Blount, NP notified of patient c/o SOB while sitting on edge of bed. VS per flow sheet. BP and HR elevated. Lung sounds clear, just diminished posteriorly. No signs of edema. No coughing noted. NS IVF stopped for 4 hours per Kennon Holter, NP. Hydralazine given IV and Xanax given PO. Patient states that he has been having these episodes while at home for a while, unable to say how long. Patient states that he feels somewhat better. Will continue to monitor.

## 2018-05-09 NOTE — Progress Notes (Signed)
PROGRESS NOTE    Ronnie [name]  ZDG:[id number] DOB: November 07, 1945 DOA: 05/07/2018 PCP: Wyatt Haste, NP   Brief Narrative:   Ronnie [name] a 72 y.o.malewith medical history significant ofhyperlipidemia, hypertension, chronic combined systolic and diastolic heart failure who is referred to the emergency department by his PCP due to uncontrolled hypertension.He also has been having episodes of pressure like CP associated with dyspnea, lightheadedness and nausea. He has been seen by Cardiology with no ACS noted, but rather poorly controlled BP. He appears to also have AKI vs. Progression of CKD. He continues to have some ongoing nausea and poor appetite.   Assessment & Plan:   Principal Problem:   Elevated troponin Active Problems:   Chest pain   Chronic combined systolic and diastolic CHF (congestive heart failure) (HCC)   Hyperlipidemia   Essential hypertension, malignant   Chronic kidney disease, stage III (moderate) (HCC)   Anemia   Malnutrition of moderate degree   1. Atypical chest pain with elevated troponin. Resolved with improvement in BP readings. [facility] evaluation. Likely related to demand ischemia with accelerated HTN. 2D echo with diminished EF noted. 2. AKI vs progression of CKD 3. Pt has symptoms of uremia and likely progression to CKD4. Will continue IVF and hold Lasix as well as lisinopril for now and monitor strict I/Os. Nephrology consultation with further workup appreciated. 3. Intractable nausea 2/2 to uremia-improving. Pt on PPI and H2 blocker with no signs of PUD. Also, RUQ U/S with no acute findings. Continue to monitor dietary intake. 4. Accelerated HTN-stabilizing. Resolved with current medications. Hold lisinopril for now due to AKI. 5. Anemia-stable. Stable and likely chronic. 6. Dyslipidemia. Continue simvastatin daily.   DVT prophylaxis:Heparin Code Status: Full Family Communication: None at bedside Disposition Plan:  Treatment of AKI/CKD evaluation per Nephrology anticipate DC in 1-2 days.   Consultants:   Cardiology  Nephrology  Procedures:   2D echocardiogram with LVEF 25-30%; on 05/08/2018  Antimicrobials:   None   Subjective: Patient seen and evaluated today with no new acute complaints or concerns. No acute concerns or events noted overnight.  He denies any further nausea or vomiting, but continues to have some poor appetite and therefore poor oral intake.  He denies any chest pain.  Objective: Vitals:   05/08/18 0619 05/08/18 1409 05/08/18 2142 05/09/18 0546  BP: (!) 155/98 136/76 137/76 (!) 157/93  Pulse: 79 72 88 79  Resp:  18 18 18   Temp: 97.8 F (36.6 C) 98.5 F (36.9 C) 98.4 F (36.9 C) 98.1 F (36.7 C)  TempSrc: Oral Oral Oral Oral  SpO2: 100% 100% 97% 100%  Weight:    67.4 kg (148 lb 9.4 oz)  Height:        Intake/Output Summary (Last 24 hours) at 05/09/2018 1203 Last data filed at 05/09/2018 0300 Gross per 24 hour  Intake 1275 ml  Output 400 ml  Net 875 ml   Filed Weights   05/07/18 2349 05/09/18 0546  Weight: 64.4 kg (141 lb 14.4 oz) 67.4 kg (148 lb 9.4 oz)    Examination:  General exam: Appears calm and comfortable  Respiratory system: Clear to auscultation. Respiratory effort normal. Cardiovascular system: S1 & S2 heard, RRR. No JVD, murmurs, rubs, gallops or clicks. No pedal edema. Gastrointestinal system: Abdomen is nondistended, soft and nontender. No organomegaly or masses felt. Normal bowel sounds heard. Central nervous system: Alert and oriented. No focal neurological deficits. Extremities: Symmetric 5 x 5 power. Skin: No rashes, lesions or ulcers  Psychiatry: Judgement and insight appear normal. Mood & affect appropriate.     Data Reviewed: I have personally reviewed following labs and imaging studies  CBC: Recent Labs  Lab 05/07/18 1800 05/08/18 0310 05/09/18 0548  WBC 7.1 7.1 6.0  NEUTROABS 4.5  --   --   HGB 9.5* 9.0* 9.5*  HCT  29.5* 27.8* 29.7*  MCV 90.8 90.3 90.8  PLT 344 286 563   Basic Metabolic Panel: Recent Labs  Lab 05/07/18 1800 05/08/18 0310 05/08/18 1517 05/09/18 0548  NA 140 134*  --  139  K 4.9 4.3  --  4.1  CL 105 101  --  106  CO2 25 25  --  26  GLUCOSE 107* 161*  --  105*  BUN 41* 38*  --  35*  CREATININE 2.33* 2.28*  --  2.37*  CALCIUM 9.4 8.8*  --  8.6*  PHOS  --   --  4.3  --    GFR: Estimated Creatinine Clearance: 26.9 mL/min (A) (by C-G formula based on SCr of 2.37 mg/dL (H)). Liver Function Tests: Recent Labs  Lab 05/07/18 1800 05/09/18 0548  AST 35 29  ALT 14 12  ALKPHOS 85 78  BILITOT 0.4 0.5  PROT 7.8 7.2  ALBUMIN 3.7 3.2*   Recent Labs  Lab 05/07/18 1800 05/08/18 0925  LIPASE 62* 55*   No results for input(s): AMMONIA in the last 168 hours. Coagulation Profile: No results for input(s): INR, PROTIME in the last 168 hours. Cardiac Enzymes: Recent Labs  Lab 05/07/18 1800 05/07/18 2109 05/08/18 0310 05/08/18 0925 05/08/18 1517  TROPONINI 0.10* 0.10* 0.09* 0.10* 0.09*   BNP (last 3 results) No results for input(s): PROBNP in the last 8760 hours. HbA1C: No results for input(s): HGBA1C in the last 72 hours. CBG: No results for input(s): GLUCAP in the last 168 hours. Lipid Profile: No results for input(s): CHOL, HDL, LDLCALC, TRIG, CHOLHDL, LDLDIRECT in the last 72 hours. Thyroid Function Tests: No results for input(s): TSH, T4TOTAL, FREET4, T3FREE, THYROIDAB in the last 72 hours. Anemia Panel: Recent Labs    05/08/18 0925  VITAMINB12 653  FOLATE 31.0  FERRITIN 127  TIBC 277  IRON 82  RETICCTPCT 1.1   Sepsis Labs: No results for input(s): PROCALCITON, LATICACIDVEN in the last 168 hours.  No results found for this or any previous visit (from the past 240 hour(s)).       Radiology Studies: Dg Chest 2 View  Result Date: 05/07/2018 CLINICAL DATA:  Generalized chest pain since this morning, shortness of breath, history COPD, hypertension,  former smoker EXAM: CHEST - 2 VIEW COMPARISON:  11/16/2017 FINDINGS: Upper normal heart size. Mediastinal contours and pulmonary vascularity normal. Lungs clear. No pleural effusion or pneumothorax. Bones unremarkable. IMPRESSION: No acute abnormalities. Electronically Signed   By: Lavonia Dana M.D.   On: 05/07/2018 18:18   US Abdomen Limited Ruq  Result Date: 05/08/2018 CLINICAL DATA:  Chronic right upper quadrant pain EXAM: ULTRASOUND ABDOMEN LIMITED RIGHT UPPER QUADRANT COMPARISON:  CT 11/16/2017, HIDA scan 10/30/2017, ultrasound 10/30/2017 FINDINGS: Gallbladder: No gallstones or wall thickening visualized. No sonographic Murphy sign noted by sonographer. Common bile duct: Diameter: 4 mm Liver: No focal lesion identified. Within normal limits in parenchymal echogenicity. Portal vein is patent on color Doppler imaging with normal direction of blood flow towards the liver. Incidental note made of multiple right renal cysts. Right kidney appears echogenic. Cysts measure up to 4.1 cm. IMPRESSION: 1. Negative for gallbladder stones or biliary dilatation  2. Echogenic right kidney suggests medical renal disease. There are multiple cysts in the right kidney. Electronically Signed   By: Donavan Foil M.D.   On: 05/08/2018 15:23        Scheduled Meds: . aspirin EC  81 mg Oral Daily  . carvedilol  6.25 mg Oral BID WC  . famotidine  20 mg Oral QHS  . feeding supplement (ENSURE ENLIVE)  237 mL Oral BID BM  . heparin injection (subcutaneous)  5,000 Units Subcutaneous Q8H  . isosorbide mononitrate  30 mg Oral Daily  . pantoprazole  40 mg Oral Daily  . simvastatin  40 mg Oral QPM   Continuous Infusions: . sodium chloride       LOS: 0 days    Time spent: 30 minutes    Antonino Nienhuis Darleen Crocker, DO [facility] Pager [phone]  If 7PM-7AM, please contact night-coverage [email or web address] Password [email or web address] 05/09/2018, 12:03 PM

## 2018-05-09 NOTE — Consult Note (Signed)
Reason for Consult: Renal failure Referring Physician: Dr. Mylinda Latina is an 72 y.o. male.  HPI: He is a patient who has history of hypertension, combined systolic and diastolic heart failure, anemia, chronic renal failure presently came with complaints of some difficulty breathing, chest pain and nausea.  Patient also complains of lightheadedness and dizziness once in a while when he is walking.  Presently he states that he is feeling much better.  He denies any vomiting.  He states that he was told he has some problem with the kidneys about a year ago by his primary care physician.  He denies any work-up.  Denies also any history of kidney stone.  As stated above patient is feeling much better.  He denies any history of urinary tract infection or diabetes.  Past Medical History:  Diagnosis Date  . Chronic combined systolic (congestive) and diastolic (congestive) heart failure (HCC)    a. EF 35 to 40% by echocardiogram in 2014 --> declined cath at that time and did not reestablish care with Cardiology until 04/2018  . Dyslipidemia   . Hypertension   . Shortness of breath     Past Surgical History:  Procedure Laterality Date  . DENTAL SURGERY      Family History  Problem Relation Age of Onset  . Heart disease Mother   . Stomach cancer Father     Social History:  reports that he quit smoking about 5 weeks ago. His smoking use included cigarettes. He has a 25.00 pack-year smoking history. He has never used smokeless tobacco. He reports that he does not drink alcohol or use drugs.  Allergies:  Allergies  Allergen Reactions  . Bee Venom Shortness Of Breath and Swelling    Medications: I have reviewed the patient's current medications.  Results for orders placed or performed during the hospital encounter of 05/07/18 (from the past 48 hour(s))  CBC with Differential     Status: Abnormal   Collection Time: 05/07/18  6:00 PM  Result Value Ref Range   WBC 7.1 4.0 - 10.5  K/uL   RBC 3.25 (L) 4.22 - 5.81 MIL/uL   Hemoglobin 9.5 (L) 13.0 - 17.0 g/dL   HCT 29.5 (L) 39.0 - 52.0 %   MCV 90.8 78.0 - 100.0 fL   MCH 29.2 26.0 - 34.0 pg   MCHC 32.2 30.0 - 36.0 g/dL   RDW 12.6 11.5 - 15.5 %   Platelets 344 150 - 400 K/uL   Neutrophils Relative % 63 %   Neutro Abs 4.5 1.7 - 7.7 K/uL   Lymphocytes Relative 26 %   Lymphs Abs 1.8 0.7 - 4.0 K/uL   Monocytes Relative 8 %   Monocytes Absolute 0.5 0.1 - 1.0 K/uL   Eosinophils Relative 3 %   Eosinophils Absolute 0.2 0.0 - 0.7 K/uL   Basophils Relative 0 %   Basophils Absolute 0.0 0.0 - 0.1 K/uL    Comment: Performed at [facility], [address] [place], [place] [address]  Troponin I     Status: Abnormal   Collection Time: 05/07/18  6:00 PM  Result Value Ref Range   Troponin I 0.10 (HH) <0.03 ng/mL    Comment: CRITICAL RESULT CALLED TO, READ BACK BY AND VERIFIED WITH: WALL,E ON 05/07/18 AT 1950 BY LOY,C Performed at [facility], [address] [place], [place] [address]   Comprehensive metabolic panel     Status: Abnormal   Collection Time: 05/07/18  6:00 PM  Result Value  Ref Range   Sodium 140 135 - 145 mmol/L   Potassium 4.9 3.5 - 5.1 mmol/L   Chloride 105 98 - 111 mmol/L   CO2 25 22 - 32 mmol/L   Glucose, Bld 107 (H) 70 - 99 mg/dL   BUN 41 (H) 8 - 23 mg/dL   Creatinine, Ser 2.33 (H) 0.61 - 1.24 mg/dL   Calcium 9.4 8.9 - 10.3 mg/dL   Total Protein 7.8 6.5 - 8.1 g/dL   Albumin 3.7 3.5 - 5.0 g/dL   AST 35 15 - 41 U/L   ALT 14 0 - 44 U/L   Alkaline Phosphatase 85 38 - 126 U/L   Total Bilirubin 0.4 0.3 - 1.2 mg/dL   GFR calc non Af Amer 26 (L) >60 mL/min   GFR calc Af Amer 30 (L) >60 mL/min    Comment: (NOTE) The eGFR has been calculated using the CKD EPI equation. This calculation has not been validated in all clinical situations. eGFR's persistently <60 mL/min signify possible Chronic Kidney Disease.    Anion gap 10 5 - 15    Comment: Performed at [facility], [address] [place], [place]  [address]  Brain natriuretic peptide     Status: Abnormal   Collection Time: 05/07/18  6:00 PM  Result Value Ref Range   B Natriuretic Peptide 1,030.0 (H) 0.0 - 100.0 pg/mL    Comment: Performed at [facility], [address], [place], [place] [address]  Lipase, blood     Status: Abnormal   Collection Time: 05/07/18  6:00 PM  Result Value Ref Range   Lipase 62 (H) 11 - 51 U/L    Comment: Performed at [facility], [address] [place], [place] [address]  Troponin I     Status: Abnormal   Collection Time: 05/07/18  9:09 PM  Result Value Ref Range   Troponin I 0.10 (HH) <0.03 ng/mL    Comment: CRITICAL RESULT CALLED TO, READ BACK BY AND VERIFIED WITH: WALL,E AT 2320 ON 8.1.2019 BY ISLEY,B Performed at [facility], [address], [place], [place] [address]   Troponin I (q 6hr x 3)     Status: Abnormal   Collection Time: 05/08/18  3:10 AM  Result Value Ref Range   Troponin I 0.09 (HH) <0.03 ng/mL    Comment: RESULT CALLED TO, READ BACK BY AND VERIFIED WITH: Performed at [facility], [address], [place],  [address]   Basic metabolic panel     Status: Abnormal   Collection Time: 05/08/18  3:10 AM  Result Value Ref Range   Sodium 134 (L) 135 - 145 mmol/L   Potassium 4.3 3.5 - 5.1 mmol/L   Chloride 101 98 - 111 mmol/L   CO2 25 22 - 32 mmol/L   Glucose, Bld 161 (H) 70 - 99 mg/dL   BUN 38 (H) 8 - 23 mg/dL   Creatinine, Ser 2.28 (H) 0.61 - 1.24 mg/dL   Calcium 8.8 (L) 8.9 - 10.3 mg/dL   GFR calc non Af Amer 27 (L) >60 mL/min   GFR calc Af Amer 31 (L) >60 mL/min    Comment: (NOTE) The eGFR has been calculated using the CKD EPI equation. This calculation has not been validated in all clinical situations. eGFR's persistently <60 mL/min signify possible Chronic Kidney Disease.    Anion gap 8 5 - 15    Comment: Performed at [facility], [address], [place],  [address]  CBC     Status: Abnormal  Collection Time: 05/08/18  3:10 AM  Result Value Ref Range   WBC  7.1 4.0 - 10.5 K/uL   RBC 3.08 (L) 4.22 - 5.81 MIL/uL   Hemoglobin 9.0 (L) 13.0 - 17.0 g/dL   HCT 27.8 (L) 39.0 - 52.0 %   MCV 90.3 78.0 - 100.0 fL   MCH 29.2 26.0 - 34.0 pg   MCHC 32.4 30.0 - 36.0 g/dL   RDW 12.6 11.5 - 15.5 %   Platelets 286 150 - 400 K/uL    Comment: Performed at [facility], [address], [place], [place] [address]  Troponin I (q 6hr x 3)     Status: Abnormal   Collection Time: 05/08/18  9:25 AM  Result Value Ref Range   Troponin I 0.10 (HH) <0.03 ng/mL    Comment: CRITICAL RESULT CALLED TO, READ BACK BY AND VERIFIED WITH: BULLINS,L AT 10:25AM ON 05/08/18 BY [facility] Performed at [facility], [address], [place], [place] [address]   Vitamin B12     Status: None   Collection Time: 05/08/18  9:25 AM  Result Value Ref Range   Vitamin B-12 653 180 - 914 pg/mL    Comment: (NOTE) This assay is not validated for testing neonatal or myeloproliferative syndrome specimens for Vitamin B12 levels. Performed at [facility], [place] [address] [place], [place] [address]   Folate     Status: None   Collection Time: 05/08/18  9:25 AM  Result Value Ref Range   Folate 31.0 >5.9 ng/mL    Comment: Performed at [facility] [address], [place], [place] [address]  Iron and TIBC     Status: None   Collection Time: 05/08/18  9:25 AM  Result Value Ref Range   Iron 82 45 - 182 ug/dL   TIBC 277 250 - 450 ug/dL   Saturation Ratios 30 17.9 - 39.5 %   UIBC 195 ug/dL    Comment: Performed at [facility], [place] [address], [place], [place] [address]  Ferritin     Status: None   Collection Time: 05/08/18  9:25 AM  Result Value Ref Range   Ferritin 127 24 - 336 ng/mL    Comment: Performed at [facility], [place] [address] [place], [place] [address]  Reticulocytes     Status: Abnormal   Collection Time: 05/08/18  9:25 AM  Result Value Ref Range   Retic Ct Pct 1.1 0.4 - 3.1 %   RBC. 3.25 (L) 4.22 - 5.81 MIL/uL   Retic Count, Absolute 35.8 19.0 -  186.0 K/uL    Comment: Performed at [facility] [address], [place], [place] [address]  Lipase, blood     Status: Abnormal   Collection Time: 05/08/18  9:25 AM  Result Value Ref Range   Lipase 55 (H) 11 - 51 U/L    Comment: Performed at [facility], [address] [place], [place] [address]  Troponin I (q 6hr x 3)     Status: Abnormal   Collection Time: 05/08/18  3:17 PM  Result Value Ref Range   Troponin I 0.09 (HH) <0.03 ng/mL    Comment: CRITICAL VALUE NOTED.  VALUE IS CONSISTENT WITH PREVIOUSLY REPORTED AND CALLED VALUE. Performed at [facility], [address] [place], [place] [address]   Phosphorus     Status: None   Collection Time: 05/08/18  3:17 PM  Result Value Ref Range   Phosphorus 4.3 2.5 - 4.6 mg/dL  Comment: Performed at [facility], [address], [place], [place] [address]  CBC     Status: Abnormal   Collection Time: 05/09/18  5:48 AM  Result Value Ref Range   WBC 6.0 4.0 - 10.5 K/uL   RBC 3.27 (L) 4.22 - 5.81 MIL/uL   Hemoglobin 9.5 (L) 13.0 - 17.0 g/dL   HCT 29.7 (L) 39.0 - 52.0 %   MCV 90.8 78.0 - 100.0 fL   MCH 29.1 26.0 - 34.0 pg   MCHC 32.0 30.0 - 36.0 g/dL   RDW 12.8 11.5 - 15.5 %   Platelets 322 150 - 400 K/uL    Comment: Performed at [facility], [address], [place], [place] [address]  Comprehensive metabolic panel     Status: Abnormal   Collection Time: 05/09/18  5:48 AM  Result Value Ref Range   Sodium 139 135 - 145 mmol/L   Potassium 4.1 3.5 - 5.1 mmol/L   Chloride 106 98 - 111 mmol/L   CO2 26 22 - 32 mmol/L   Glucose, Bld 105 (H) 70 - 99 mg/dL   BUN 35 (H) 8 - 23 mg/dL   Creatinine, Ser 2.37 (H) 0.61 - 1.24 mg/dL   Calcium 8.6 (L) 8.9 - 10.3 mg/dL   Total Protein 7.2 6.5 - 8.1 g/dL   Albumin 3.2 (L) 3.5 - 5.0 g/dL   AST 29 15 - 41 U/L   ALT 12 0 - 44 U/L   Alkaline Phosphatase 78 38 - 126 U/L   Total Bilirubin 0.5 0.3 - 1.2 mg/dL   GFR calc non Af Amer 26 (L) >60 mL/min   GFR calc Af Amer 30 (L) >60 mL/min    Comment:  (NOTE) The eGFR has been calculated using the CKD EPI equation. This calculation has not been validated in all clinical situations. eGFR's persistently <60 mL/min signify possible Chronic Kidney Disease.    Anion gap 7 5 - 15    Comment: Performed at [facility], [address], [place], [place] [address]    Dg Chest 2 View  Result Date: 05/07/2018 CLINICAL DATA:  Generalized chest pain since this morning, shortness of breath, history COPD, hypertension, former smoker EXAM: CHEST - 2 VIEW COMPARISON:  11/16/2017 FINDINGS: Upper normal heart size. Mediastinal contours and pulmonary vascularity normal. Lungs clear. No pleural effusion or pneumothorax. Bones unremarkable. IMPRESSION: No acute abnormalities. Electronically Signed   By: Lavonia Dana M.D.   On: 05/07/2018 18:18   US Abdomen Limited Ruq  Result Date: 05/08/2018 CLINICAL DATA:  Chronic right upper quadrant pain EXAM: ULTRASOUND ABDOMEN LIMITED RIGHT UPPER QUADRANT COMPARISON:  CT 11/16/2017, HIDA scan 10/30/2017, ultrasound 10/30/2017 FINDINGS: Gallbladder: No gallstones or wall thickening visualized. No sonographic Murphy sign noted by sonographer. Common bile duct: Diameter: 4 mm Liver: No focal lesion identified. Within normal limits in parenchymal echogenicity. Portal vein is patent on color Doppler imaging with normal direction of blood flow towards the liver. Incidental note made of multiple right renal cysts. Right kidney appears echogenic. Cysts measure up to 4.1 cm. IMPRESSION: 1. Negative for gallbladder stones or biliary dilatation 2. Echogenic right kidney suggests medical renal disease. There are multiple cysts in the right kidney. Electronically Signed   By: Donavan Foil M.D.   On: 05/08/2018 15:23    Review of Systems  Constitutional: Negative for chills and fever.  Respiratory: Negative for cough, hemoptysis and shortness of breath.   Cardiovascular: Positive for chest pain. Negative for palpitations and orthopnea.   Gastrointestinal:  Positive for nausea. Negative for abdominal pain and vomiting.  Neurological: Positive for dizziness.   Blood pressure (!) 157/93, pulse 79, temperature 98.1 F (36.7 C), temperature source Oral, resp. rate 18, height 6' (1.829 m), weight 67.4 kg (148 lb 9.4 oz), SpO2 100 %. Physical Exam  Constitutional: He is oriented to person, place, and time. No distress.  Neck: No JVD present.  Cardiovascular: Normal rate and regular rhythm.  No murmur heard. Respiratory: No respiratory distress. He has no wheezes. He has no rales.  GI: He exhibits no distension. There is no tenderness.  Musculoskeletal: He exhibits no edema.  Neurological: He is alert and oriented to person, place, and time.    Assessment/Plan: 1] renal failure: Possible acute on chronic versus natural progression of his kidney disease.  Patient with creatinine of 1.43 and EGFR of 57 cc/min/1.73  on 06/16/2013.  Since then there is no other blood work and presently his creatinine is 2.37.  Etiology could be secondary to cardiorenal/hypertension/ischemic.  At this moment other etiologies cannot be ruled out.  Patient has some nausea which seems to be getting better.  He does not have any other signs and symptoms of uremia. 2] history of chronic combined systolic and diastolic dysfunction.  He had echocardiogram done on 06/16/2013 which showed mild concentric hypertrophy with ejection fraction of 35 to 40%.  Repeat echo at this moment showed severe systolic dysfunction with ejection fraction ranging between 25% to 30%..  Patient however does not have any clinical sign of fluid overload. 3] hypertension: His blood pressure seems to be better controlled.  According the patient his medication has been changed multiple times because of difficulty in controlling his blood pressure. 4] history of proteinuria: Patient had serum protein and immunoelectrophoresis 10 which showed elevation of IgA and IgM but no M spike.  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 5] anemia: Iron deficiency anemia versus anemia of chronic kidney disease 6] chest pain. 7] patient with multiple renal cyst but no mention of left kidney. Plan: 1]Agree with ultrasound of the kidneys 2] we will check ANA, complement, hepatitis B surface antigen, hepatitis C antibody 3] we will do serum protein and immunoelectrophoresis 4] we will do 24-hour urine for protein and immunoelectrophoresis 5]  normal saline at 75 cc/h 6] we will check urine sodium and creatinine. 7] we will check a renal panel in the morning.  Burnett Lieber S 05/09/2018, 8:55 AM

## 2018-05-10 DIAGNOSIS — R748 Abnormal levels of other serum enzymes: Secondary | ICD-10-CM | POA: Diagnosis not present

## 2018-05-10 DIAGNOSIS — I169 Hypertensive crisis, unspecified: Secondary | ICD-10-CM | POA: Diagnosis not present

## 2018-05-10 LAB — RENAL FUNCTION PANEL
Albumin: 3.1 g/dL — ABNORMAL LOW (ref 3.5–5.0)
Anion gap: 5 (ref 5–15)
BUN: 32 mg/dL — ABNORMAL HIGH (ref 8–23)
CHLORIDE: 110 mmol/L (ref 98–111)
CO2: 25 mmol/L (ref 22–32)
CREATININE: 2.18 mg/dL — AB (ref 0.61–1.24)
Calcium: 8.8 mg/dL — ABNORMAL LOW (ref 8.9–10.3)
GFR calc Af Amer: 33 mL/min — ABNORMAL LOW (ref >60)
GFR calc non Af Amer: 28 mL/min — ABNORMAL LOW (ref >60)
Glucose, Bld: 91 mg/dL (ref 70–99)
Phosphorus: 3.4 mg/dL (ref 2.5–4.6)
Potassium: 4 mmol/L (ref 3.5–5.1)
Sodium: 140 mmol/L (ref 135–145)

## 2018-05-10 LAB — OCCULT BLOOD X 1 CARD TO LAB, STOOL: FECAL OCCULT BLD: NEGATIVE

## 2018-05-10 LAB — PROTEIN, URINE, 24 HOUR
COLLECTION INTERVAL-UPROT: 24 h
Protein, 24H Urine: 698 mg/d — ABNORMAL HIGH (ref 50–100)
Protein, Urine: 31 mg/dL
Urine Total Volume-UPROT: 2250 mL

## 2018-05-10 LAB — C4 COMPLEMENT: Complement C4, Body Fluid: 25 mg/dL (ref 14–44)

## 2018-05-10 LAB — ANTISTREPTOLYSIN O TITER: ASO: 29 [IU]/mL (ref 0.0–200.0)

## 2018-05-10 LAB — C3 COMPLEMENT: C3 Complement: 133 mg/dL (ref 82–167)

## 2018-05-10 LAB — HEPATITIS B SURFACE ANTIGEN: Hepatitis B Surface Ag: NEGATIVE

## 2018-05-10 MED ORDER — CARVEDILOL 12.5 MG PO TABS
12.5000 mg | ORAL_TABLET | Freq: Two times a day (BID) | ORAL | Status: DC
  Administered 2018-05-10 – 2018-05-12 (×5): 12.5 mg via ORAL
  Filled 2018-05-10 (×5): qty 1

## 2018-05-10 NOTE — Progress Notes (Signed)
PROGRESS NOTE    JONAVIN SEDER  WJX:[id number] DOB: 02-12-1946 DOA: 05/07/2018 PCP: Wyatt Haste, NP   Brief Narrative:   Nasser Ku Hairstonis a 72 y.o.malewith medical history significant ofhyperlipidemia, hypertension, chronic combined systolic and diastolic heart failure who is referred to the emergency department by his PCP due to uncontrolled hypertension.He also has been having episodes of pressure like CP associated with dyspnea, lightheadedness and nausea.He has been seen by Cardiology with no ACS noted, but rather poorly controlled BP. He appears to also have AKI vs. Progression of CKD. He continues to have some ongoing nausea and poor appetite, but renal function appears to be improving.  His blood pressures continue to remain somewhat elevated.   Assessment & Plan:  Principal Problem: Elevated troponin Active Problems: Chest pain Chronic combined systolic and diastolic CHF (congestive heart failure) (HCC) Hyperlipidemia Essential hypertension, malignant Chronic kidney disease, stage III (moderate) (HCC) Anemia Malnutrition of moderate degree   1. Atypical chest pain with elevated troponin. Resolved with improvement in BP readings. [facility] evaluation. Likely related to demand ischemia with accelerated HTN. 2D echo with diminished EF noted. 2. AKI vs progression of CKD 3-improving. Pt has symptoms of uremia and likely progression to CKD4. Will continue IVF and hold Lasix as well as lisinopril for now and monitor strict I/Os. Nephrology consultation with further workup appreciated with plans for repeat labs in a.m. and continuation of IV fluid and iron studies. 3. Intractable nausea 2/2 to uremia-improving slowly. Pt on PPI and H2 blocker with no signs of PUD. Also, RUQ U/S with no acute findings. Continue to monitor dietary intake. 4. Labile hypertension.  Continue current medications with home lisinopril withheld at this time.   Coreg dose doubled to 12.5 mg twice daily to begin this evening.  Hydralazine pushes as needed. 5. Anemia-stable. Stable and likely chronic. 6. Dyslipidemia. Continue simvastatin daily.   DVT prophylaxis:Heparin Code Status:Full Family Communication:None at bedside Disposition Plan:Treatment of AKI/CKD evaluation per Nephrology anticipate DC in 1-2 days.   Consultants:  Cardiology  Nephrology  Procedures:  2D echocardiogram with LVEF 25-30%; on 05/08/2018  Antimicrobials:   None   Subjective: Patient seen and evaluated today with no new acute complaints or concerns. No acute concerns or events noted overnight.  He states that his appetite seems to be improving, but he continues to feel weak.  Objective: Vitals:   05/09/18 2246 05/10/18 0500 05/10/18 0624 05/10/18 1139  BP: (!) 170/85  (!) 155/109 (!) 171/107  Pulse: (!) 104  91   Resp:   18   Temp:   97.9 F (36.6 C)   TempSrc:   Oral   SpO2: 100%  100%   Weight:  66.4 kg (146 lb 6.4 oz)    Height:        Intake/Output Summary (Last 24 hours) at 05/10/2018 1158 Last data filed at 05/10/2018 0913 Gross per 24 hour  Intake 1380.88 ml  Output 1900 ml  Net -519.12 ml   Filed Weights   05/07/18 2349 05/09/18 0546 05/10/18 0500  Weight: 64.4 kg (141 lb 14.4 oz) 67.4 kg (148 lb 9.4 oz) 66.4 kg (146 lb 6.4 oz)    Examination:  General exam: Appears calm and comfortable  Respiratory system: Clear to auscultation. Respiratory effort normal. Cardiovascular system: S1 & S2 heard, RRR. No JVD, murmurs, rubs, gallops or clicks. No pedal edema. Gastrointestinal system: Abdomen is nondistended, soft and nontender. No organomegaly or masses felt. Normal bowel sounds heard. Central nervous system: Alert  and oriented. No focal neurological deficits. Extremities: Symmetric 5 x 5 power. Skin: No rashes, lesions or ulcers Psychiatry: Judgement and insight appear normal. Mood & affect appropriate.     Data Reviewed:  I have personally reviewed following labs and imaging studies  CBC: Recent Labs  Lab 05/07/18 1800 05/08/18 0310 05/09/18 0548  WBC 7.1 7.1 6.0  NEUTROABS 4.5  --   --   HGB 9.5* 9.0* 9.5*  HCT 29.5* 27.8* 29.7*  MCV 90.8 90.3 90.8  PLT 344 286 681   Basic Metabolic Panel: Recent Labs  Lab 05/07/18 1800 05/08/18 0310 05/08/18 1517 05/09/18 0548 05/10/18 0707  NA 140 134*  --  139 140  K 4.9 4.3  --  4.1 4.0  CL 105 101  --  106 110  CO2 25 25  --  26 25  GLUCOSE 107* 161*  --  105* 91  BUN 41* 38*  --  35* 32*  CREATININE 2.33* 2.28*  --  2.37* 2.18*  CALCIUM 9.4 8.8*  --  8.6* 8.8*  PHOS  --   --  4.3  --  3.4   GFR: Estimated Creatinine Clearance: 28.8 mL/min (A) (by C-G formula based on SCr of 2.18 mg/dL (H)). Liver Function Tests: Recent Labs  Lab 05/07/18 1800 05/09/18 0548 05/10/18 0707  AST 35 29  --   ALT 14 12  --   ALKPHOS 85 78  --   BILITOT 0.4 0.5  --   PROT 7.8 7.2  --   ALBUMIN 3.7 3.2* 3.1*   Recent Labs  Lab 05/07/18 1800 05/08/18 0925  LIPASE 62* 55*   No results for input(s): AMMONIA in the last 168 hours. Coagulation Profile: No results for input(s): INR, PROTIME in the last 168 hours. Cardiac Enzymes: Recent Labs  Lab 05/07/18 1800 05/07/18 2109 05/08/18 0310 05/08/18 0925 05/08/18 1517  TROPONINI 0.10* 0.10* 0.09* 0.10* 0.09*   BNP (last 3 results) No results for input(s): PROBNP in the last 8760 hours. HbA1C: No results for input(s): HGBA1C in the last 72 hours. CBG: No results for input(s): GLUCAP in the last 168 hours. Lipid Profile: No results for input(s): CHOL, HDL, LDLCALC, TRIG, CHOLHDL, LDLDIRECT in the last 72 hours. Thyroid Function Tests: No results for input(s): TSH, T4TOTAL, FREET4, T3FREE, THYROIDAB in the last 72 hours. Anemia Panel: Recent Labs    05/08/18 0925  VITAMINB12 653  FOLATE 31.0  FERRITIN 127  TIBC 277  IRON 82  RETICCTPCT 1.1   Sepsis Labs: No results for input(s): PROCALCITON,  LATICACIDVEN in the last 168 hours.  No results found for this or any previous visit (from the past 240 hour(s)).       Radiology Studies: US Renal  Result Date: 05/09/2018 CLINICAL DATA:  Acute renal injury EXAM: RENAL / URINARY TRACT ULTRASOUND COMPLETE COMPARISON:  CT from 11/16/2017, ultrasound from 09/07/2015 FINDINGS: Right Kidney: Length: 11.9 cm. Multiple cysts are identified. The largest of these measures 8.6 cm. These changes are stable from the prior exam. Diffuse increased echogenicity is noted. Left Kidney: Length: 11.6 cm. Cystic lesions are noted. The largest of these measures 6.7 cm inferiorly. This is also stable from the prior CT. Mild increased echogenicity is noted. Bladder: Appears normal for degree of bladder distention. IMPRESSION: Bilateral renal cystic change stable from the prior exam. Mild increased echogenicity within the kidneys similar to that seen on prior exam. Electronically Signed   By: Inez Catalina M.D.   On: 05/09/2018 13:33  US Abdomen Limited Ruq  Result Date: 05/08/2018 CLINICAL DATA:  Chronic right upper quadrant pain EXAM: ULTRASOUND ABDOMEN LIMITED RIGHT UPPER QUADRANT COMPARISON:  CT 11/16/2017, HIDA scan 10/30/2017, ultrasound 10/30/2017 FINDINGS: Gallbladder: No gallstones or wall thickening visualized. No sonographic Murphy sign noted by sonographer. Common bile duct: Diameter: 4 mm Liver: No focal lesion identified. Within normal limits in parenchymal echogenicity. Portal vein is patent on color Doppler imaging with normal direction of blood flow towards the liver. Incidental note made of multiple right renal cysts. Right kidney appears echogenic. Cysts measure up to 4.1 cm. IMPRESSION: 1. Negative for gallbladder stones or biliary dilatation 2. Echogenic right kidney suggests medical renal disease. There are multiple cysts in the right kidney. Electronically Signed   By: Donavan Foil M.D.   On: 05/08/2018 15:23        Scheduled Meds: . aspirin EC   81 mg Oral Daily  . carvedilol  12.5 mg Oral BID WC  . famotidine  20 mg Oral QHS  . feeding supplement (ENSURE ENLIVE)  237 mL Oral BID BM  . heparin injection (subcutaneous)  5,000 Units Subcutaneous Q8H  . isosorbide mononitrate  30 mg Oral Daily  . pantoprazole  40 mg Oral Daily  . simvastatin  40 mg Oral QPM   Continuous Infusions: . sodium chloride 75 mL/hr at 05/10/18 0257     LOS: 0 days    Time spent: 30 minutes    Ovetta Bazzano Darleen Crocker, DO [facility] Pager [phone]  If 7PM-7AM, please contact night-coverage [email or web address] Password [email or web address] 05/10/2018, 11:58 AM

## 2018-05-10 NOTE — Progress Notes (Signed)
Subjective: Interval History: has no complaint of nausea or vomiting.  Appetite is getting better.  He had some epigastric discomfort last night.  Denies any difficulty breathing..  Objective: Vital signs in last 24 hours: Temp:  [97.5 F (36.4 C)-98.4 F (36.9 C)] 97.9 F (36.6 C) (08/04 0624) Pulse Rate:  [81-110] 91 (08/04 0624) Resp:  [18-20] 18 (08/04 0624) BP: (141-190)/(79-114) 155/109 (08/04 0624) SpO2:  [98 %-100 %] 100 % (08/04 0624) Weight:  [66.4 kg (146 lb 6.4 oz)] 66.4 kg (146 lb 6.4 oz) (08/04 0500) Weight change: -0.993 kg (-2 lb 3 oz)  Intake/Output from previous day: 08/03 0701 - 08/04 0700 In: 1380.9 [P.O.:600; I.V.:780.9] Out: 1625 [Urine:1625] Intake/Output this shift: Total I/O In: -  Out: 575 [Urine:575]  General appearance: alert, cooperative and no distress Resp: clear to auscultation bilaterally Cardio: regular rate and rhythm Extremities: No edema  Lab Results: Recent Labs    05/08/18 0310 05/09/18 0548  WBC 7.1 6.0  HGB 9.0* 9.5*  HCT 27.8* 29.7*  PLT 286 322   BMET:  Recent Labs    05/09/18 0548 05/10/18 0707  NA 139 140  K 4.1 4.0  CL 106 110  CO2 26 25  GLUCOSE 105* 91  BUN 35* 32*  CREATININE 2.37* 2.18*  CALCIUM 8.6* 8.8*   No results for input([name]): PTH in the last 72 hours. Iron Studies:  Recent Labs    05/08/18 0925  IRON 82  TIBC 277  FERRITIN 127    Studies/Results: US Renal  Result Date: 05/09/2018 CLINICAL DATA:  Acute renal injury EXAM: RENAL / URINARY TRACT ULTRASOUND COMPLETE COMPARISON:  CT from 11/16/2017, ultrasound from 09/07/2015 FINDINGS: Right Kidney: Length: 11.9 cm. Multiple cysts are identified. The largest of these measures 8.6 cm. These changes are stable from the prior exam. Diffuse increased echogenicity is noted. Left Kidney: Length: 11.6 cm. Cystic lesions are noted. The largest of these measures 6.7 cm inferiorly. This is also stable from the prior CT. Mild increased echogenicity is noted.  Bladder: Appears normal for degree of bladder distention. IMPRESSION: Bilateral renal cystic change stable from the prior exam. Mild increased echogenicity within the kidneys similar to that seen on prior exam. Electronically Signed   By: Inez Catalina M.D.   On: 05/09/2018 13:33   US Abdomen Limited Ruq  Result Date: 05/08/2018 CLINICAL DATA:  Chronic right upper quadrant pain EXAM: ULTRASOUND ABDOMEN LIMITED RIGHT UPPER QUADRANT COMPARISON:  CT 11/16/2017, HIDA scan 10/30/2017, ultrasound 10/30/2017 FINDINGS: Gallbladder: No gallstones or wall thickening visualized. No sonographic Murphy sign noted by sonographer. Common bile duct: Diameter: 4 mm Liver: No focal lesion identified. Within normal limits in parenchymal echogenicity. Portal vein is patent on color Doppler imaging with normal direction of blood flow towards the liver. Incidental note made of multiple right renal cysts. Right kidney appears echogenic. Cysts measure up to 4.1 cm. IMPRESSION: 1. Negative for gallbladder stones or biliary dilatation 2. Echogenic right kidney suggests medical renal disease. There are multiple cysts in the right kidney. Electronically Signed   By: Donavan Foil M.D.   On: 05/08/2018 15:23    I have reviewed the patient'[name] current medications.  Assessment/Plan: 1] renal failure: Possibly acute on chronic.  Patient is nonoliguric today.Marland Kitchen  His creatinine has come down to 2.18. 2] bone and mineral disorder: His calcium and phosphorus is in range 3] proteinuria: Work-up at this moment is pending 4] hypertension: His blood pressure is somewhat high. 5] history of CHF: Patient with severe systolic  dysfunction.  He had about 1600 cc of urine output.  Denies any difficulty breathing. 6] anemia: Patient hemoglobin  is below our target goal.  This could be secondary to iron deficiency anemia or anemia of chronic disease. Plan: 1] we will check iron studies, CBC and renal panel in the morning. 2] we will continue his  present management   LOS: 0 days   Ronnie [name] 05/10/2018,9:23 AM

## 2018-05-11 ENCOUNTER — Encounter (HOSPITAL_COMMUNITY): Payer: Self-pay | Admitting: Gastroenterology

## 2018-05-11 DIAGNOSIS — D631 Anemia in chronic kidney disease: Secondary | ICD-10-CM | POA: Diagnosis present

## 2018-05-11 DIAGNOSIS — I169 Hypertensive crisis, unspecified: Secondary | ICD-10-CM | POA: Diagnosis present

## 2018-05-11 DIAGNOSIS — R040 Epistaxis: Secondary | ICD-10-CM | POA: Diagnosis not present

## 2018-05-11 DIAGNOSIS — F419 Anxiety disorder, unspecified: Secondary | ICD-10-CM | POA: Diagnosis present

## 2018-05-11 DIAGNOSIS — E785 Hyperlipidemia, unspecified: Secondary | ICD-10-CM | POA: Diagnosis present

## 2018-05-11 DIAGNOSIS — Z9114 Patient's other noncompliance with medication regimen: Secondary | ICD-10-CM | POA: Diagnosis not present

## 2018-05-11 DIAGNOSIS — Z681 Body mass index (BMI) 19 or less, adult: Secondary | ICD-10-CM | POA: Diagnosis not present

## 2018-05-11 DIAGNOSIS — N179 Acute kidney failure, unspecified: Secondary | ICD-10-CM | POA: Diagnosis present

## 2018-05-11 DIAGNOSIS — D509 Iron deficiency anemia, unspecified: Secondary | ICD-10-CM | POA: Diagnosis present

## 2018-05-11 DIAGNOSIS — I5042 Chronic combined systolic (congestive) and diastolic (congestive) heart failure: Secondary | ICD-10-CM | POA: Diagnosis present

## 2018-05-11 DIAGNOSIS — F329 Major depressive disorder, single episode, unspecified: Secondary | ICD-10-CM | POA: Diagnosis present

## 2018-05-11 DIAGNOSIS — D649 Anemia, unspecified: Secondary | ICD-10-CM | POA: Diagnosis not present

## 2018-05-11 DIAGNOSIS — Z87891 Personal history of nicotine dependence: Secondary | ICD-10-CM | POA: Diagnosis not present

## 2018-05-11 DIAGNOSIS — Z9119 Patient's noncompliance with other medical treatment and regimen: Secondary | ICD-10-CM | POA: Diagnosis not present

## 2018-05-11 DIAGNOSIS — Z7982 Long term (current) use of aspirin: Secondary | ICD-10-CM | POA: Diagnosis not present

## 2018-05-11 DIAGNOSIS — R0789 Other chest pain: Secondary | ICD-10-CM | POA: Diagnosis present

## 2018-05-11 DIAGNOSIS — E440 Moderate protein-calorie malnutrition: Secondary | ICD-10-CM | POA: Diagnosis present

## 2018-05-11 DIAGNOSIS — N183 Chronic kidney disease, stage 3 (moderate): Secondary | ICD-10-CM | POA: Diagnosis present

## 2018-05-11 DIAGNOSIS — Z79899 Other long term (current) drug therapy: Secondary | ICD-10-CM | POA: Diagnosis not present

## 2018-05-11 DIAGNOSIS — I130 Hypertensive heart and chronic kidney disease with heart failure and stage 1 through stage 4 chronic kidney disease, or unspecified chronic kidney disease: Secondary | ICD-10-CM | POA: Diagnosis present

## 2018-05-11 DIAGNOSIS — R748 Abnormal levels of other serum enzymes: Secondary | ICD-10-CM | POA: Diagnosis not present

## 2018-05-11 LAB — RENAL FUNCTION PANEL
Albumin: 3.4 g/dL — ABNORMAL LOW (ref 3.5–5.0)
Anion gap: 8 (ref 5–15)
BUN: 29 mg/dL — AB (ref 8–23)
CO2: 23 mmol/L (ref 22–32)
Calcium: 9.4 mg/dL (ref 8.9–10.3)
Chloride: 109 mmol/L (ref 98–111)
Creatinine, Ser: 1.81 mg/dL — ABNORMAL HIGH (ref 0.61–1.24)
GFR calc Af Amer: 41 mL/min — ABNORMAL LOW (ref >60)
GFR calc non Af Amer: 36 mL/min — ABNORMAL LOW (ref >60)
Glucose, Bld: 98 mg/dL (ref 70–99)
POTASSIUM: 3.8 mmol/L (ref 3.5–5.1)
Phosphorus: 2.2 mg/dL — ABNORMAL LOW (ref 2.5–4.6)
Sodium: 140 mmol/L (ref 135–145)

## 2018-05-11 LAB — CBC
HEMATOCRIT: 28.4 % — AB (ref 39.0–52.0)
Hemoglobin: 9.3 g/dL — ABNORMAL LOW (ref 13.0–17.0)
MCH: 29.6 pg (ref 26.0–34.0)
MCHC: 32.7 g/dL (ref 30.0–36.0)
MCV: 90.4 fL (ref 78.0–100.0)
Platelets: 328 10*3/uL (ref 150–400)
RBC: 3.14 MIL/uL — ABNORMAL LOW (ref 4.22–5.81)
RDW: 12.9 % (ref 11.5–15.5)
WBC: 8.1 10*3/uL (ref 4.0–10.5)

## 2018-05-11 LAB — PROTEIN ELECTROPHORESIS, SERUM
A/G RATIO SPE: 0.8 (ref 0.7–1.7)
Albumin ELP: 3 g/dL (ref 2.9–4.4)
Alpha-1-Globulin: 0.2 g/dL (ref 0.0–0.4)
Alpha-2-Globulin: 1.1 g/dL — ABNORMAL HIGH (ref 0.4–1.0)
BETA GLOBULIN: 1.1 g/dL (ref 0.7–1.3)
Gamma Globulin: 1.1 g/dL (ref 0.4–1.8)
Globulin, Total: 3.6 g/dL (ref 2.2–3.9)
Total Protein ELP: 6.6 g/dL (ref 6.0–8.5)

## 2018-05-11 LAB — FERRITIN: Ferritin: 113 ng/mL (ref 24–336)

## 2018-05-11 LAB — IRON AND TIBC
Iron: 62 ug/dL (ref 45–182)
Saturation Ratios: 23 % (ref 17.9–39.5)
TIBC: 270 ug/dL (ref 250–450)
UIBC: 208 ug/dL

## 2018-05-11 LAB — SODIUM, URINE, RANDOM: SODIUM UR: 129 mmol/L

## 2018-05-11 LAB — CREATININE, URINE, RANDOM: CREATININE, URINE: 42.43 mg/dL

## 2018-05-11 LAB — ANTINUCLEAR ANTIBODIES, IFA: ANTINUCLEAR ANTIBODIES, IFA: NEGATIVE

## 2018-05-11 MED ORDER — LISINOPRIL 10 MG PO TABS
20.0000 mg | ORAL_TABLET | Freq: Every day | ORAL | Status: DC
  Administered 2018-05-11 – 2018-05-12 (×2): 20 mg via ORAL
  Filled 2018-05-11 (×2): qty 2

## 2018-05-11 MED ORDER — SALINE SPRAY 0.65 % NA SOLN
1.0000 | NASAL | Status: DC | PRN
  Administered 2018-05-11: 1 via NASAL
  Filled 2018-05-11: qty 44

## 2018-05-11 MED ORDER — CITALOPRAM HYDROBROMIDE 20 MG PO TABS
10.0000 mg | ORAL_TABLET | Freq: Every day | ORAL | Status: DC
  Administered 2018-05-11 – 2018-05-12 (×2): 10 mg via ORAL
  Filled 2018-05-11 (×2): qty 1

## 2018-05-11 MED ORDER — PANTOPRAZOLE SODIUM 40 MG PO TBEC
40.0000 mg | DELAYED_RELEASE_TABLET | Freq: Every day | ORAL | Status: DC
  Administered 2018-05-12: 40 mg via ORAL
  Filled 2018-05-11 (×3): qty 1

## 2018-05-11 MED ORDER — HYDRALAZINE HCL 25 MG PO TABS
25.0000 mg | ORAL_TABLET | Freq: Three times a day (TID) | ORAL | Status: DC
  Administered 2018-05-11 – 2018-05-12 (×4): 25 mg via ORAL
  Filled 2018-05-11 (×6): qty 1

## 2018-05-11 MED ORDER — ONDANSETRON 4 MG PO TBDP
4.0000 mg | ORAL_TABLET | Freq: Three times a day (TID) | ORAL | Status: DC
  Administered 2018-05-11 – 2018-05-12 (×3): 4 mg via ORAL
  Filled 2018-05-11 (×4): qty 1

## 2018-05-11 MED ORDER — ALPRAZOLAM 0.25 MG PO TABS
0.2500 mg | ORAL_TABLET | Freq: Two times a day (BID) | ORAL | Status: DC
  Administered 2018-05-11 – 2018-05-12 (×3): 0.25 mg via ORAL
  Filled 2018-05-11 (×4): qty 1

## 2018-05-11 NOTE — Progress Notes (Addendum)
Progress Note  Patient Name: Ronnie [name] Date of Encounter: 05/11/2018  Primary Cardiologist: Carlyle Dolly, MD   Subjective   Complains of headache, nosebleed and heart racing.  Inpatient Medications    Scheduled Meds: . aspirin EC  81 mg Oral Daily  . carvedilol  12.5 mg Oral BID WC  . famotidine  20 mg Oral QHS  . feeding supplement (ENSURE ENLIVE)  237 mL Oral BID BM  . heparin injection (subcutaneous)  5,000 Units Subcutaneous Q8H  . isosorbide mononitrate  30 mg Oral Daily  . pantoprazole  40 mg Oral Daily  . simvastatin  40 mg Oral QPM   Continuous Infusions: . sodium chloride 75 mL/hr at 05/11/18 0025   PRN Meds: acetaminophen **OR** acetaminophen, hydrALAZINE, ondansetron **OR** ondansetron (ZOFRAN) IV, sodium chloride   Vital Signs    Vitals:   05/11/18 0026 05/11/18 0212 05/11/18 0500 05/11/18 0634  BP: (!) 188/99 (!) 166/94  (!) 177/100  Pulse: (!) 103 (!) 107  (!) 102  Resp:    18  Temp:    98.4 F (36.9 C)  TempSrc:    Oral  SpO2:  99%  98%  Weight:   147 lb 14.9 oz (67.1 kg)   Height:        Intake/Output Summary (Last 24 hours) at 05/11/2018 0747 Last data filed at 05/11/2018 0700 Gross per 24 hour  Intake 613 ml  Output 1600 ml  Net -987 ml   Filed Weights   05/09/18 0546 05/10/18 0500 05/11/18 0500  Weight: 148 lb 9.4 oz (67.4 kg) 146 lb 6.4 oz (66.4 kg) 147 lb 14.9 oz (67.1 kg)    Telemetry    Sinus tachycardia at 120/m - Personally Reviewed  ECG       Physical Exam    GEN: No acute distress.   Neck: No JVD Cardiac: RRR at 950/D, 3-2/6 diastolic murmur LSB.  Respiratory: Clear to auscultation bilaterally. GI: Soft, nontender, non-distended  MS: No edema; No deformity. Neuro:  Nonfocal  Psych: Normal affect   Labs    Chemistry Recent Labs  Lab 05/07/18 1800  05/09/18 0548 05/10/18 0707 05/11/18 0622  NA 140   < > 139 140 140  K 4.9   < > 4.1 4.0 3.8  CL 105   < > 106 110 109  CO2 25   < > 26 25 23     GLUCOSE 107*   < > 105* 91 98  BUN 41*   < > 35* 32* 29*  CREATININE 2.33*   < > 2.37* 2.18* 1.81*  CALCIUM 9.4   < > 8.6* 8.8* 9.4  PROT 7.8  --  7.2  --   --   ALBUMIN 3.7  --  3.2* 3.1* 3.4*  AST 35  --  29  --   --   ALT 14  --  12  --   --   ALKPHOS 85  --  78  --   --   BILITOT 0.4  --  0.5  --   --   GFRNONAA 26*   < > 26* 28* 36*  GFRAA 30*   < > 30* 33* 41*  ANIONGAP 10   < > 7 5 8    < > = values in this interval not displayed.     Hematology Recent Labs  Lab 05/08/18 0310 05/08/18 0925 05/09/18 0548 05/11/18 0622  WBC 7.1  --  6.0 8.1  RBC 3.08* 3.25* 3.27* 3.14*  HGB  9.0*  --  9.5* 9.3*  HCT 27.8*  --  29.7* 28.4*  MCV 90.3  --  90.8 90.4  MCH 29.2  --  29.1 29.6  MCHC 32.4  --  32.0 32.7  RDW 12.6  --  12.8 12.9  PLT 286  --  322 328    Cardiac Enzymes Recent Labs  Lab 05/07/18 2109 05/08/18 0310 05/08/18 0925 05/08/18 1517  TROPONINI 0.10* 0.09* 0.10* 0.09*   No results for input(s): TROPIPOC in the last 168 hours.   BNP Recent Labs  Lab 05/07/18 1800  BNP 1,030.0*     DDimer No results for input(s): DDIMER in the last 168 hours.   Radiology    US Renal  Result Date: 05/09/2018 CLINICAL DATA:  Acute renal injury EXAM: RENAL / URINARY TRACT ULTRASOUND COMPLETE COMPARISON:  CT from 11/16/2017, ultrasound from 09/07/2015 FINDINGS: Right Kidney: Length: 11.9 cm. Multiple cysts are identified. The largest of these measures 8.6 cm. These changes are stable from the prior exam. Diffuse increased echogenicity is noted. Left Kidney: Length: 11.6 cm. Cystic lesions are noted. The largest of these measures 6.7 cm inferiorly. This is also stable from the prior CT. Mild increased echogenicity is noted. Bladder: Appears normal for degree of bladder distention. IMPRESSION: Bilateral renal cystic change stable from the prior exam. Mild increased echogenicity within the kidneys similar to that seen on prior exam. Electronically Signed   By: Ronnie Catalina M.D.    On: 05/09/2018 13:33    Cardiac Studies  2D echo 05/08/2018  Study Conclusions   - Left ventricle: The cavity size was normal. Wall thickness was   increased in a pattern of moderate LVH. Systolic function was   severely reduced. The estimated ejection fraction was in the   range of 25% to 30%. Diffuse hypokinesis. The study is not   technically sufficient to allow evaluation of LV diastolic   function. - Aortic valve: There was moderate regurgitation. Valve area (VTI):   1.63 cm^2. Valve area (Vmax): 1.82 cm^2. Valve area (Vmean): 1.74   cm^2. - Mitral valve: There was mild regurgitation. - Left atrium: The atrium was severely dilated. - Right atrium: The atrium was mildly dilated.     Patient Profile     72 y.o. male with chronic systolic CHF admitted with chest pain, flat troponins, recent elevated blood pressures because of inability to keep his pills down secondary to nausea and vomiting  Assessment & Plan    Chronic systolic  CHF with worsening LV function now 25 to 30% with moderate AI.-931 cc since admission. Overall compensated.  Chest pain with flat troponins.  Has refused cath in the past  Hypertension accelerated on admission secondary to nausea and vomiting felt due to uremia-still high and sinus tachycardia as well- Coreg increased to 12.5 mg BID yest, lisinopril increased to 20 mg daily today. Also on Imdur 30 mg daily. With sinus tachycardia ideally need to increase Coreg to 25 mg BID, but will wait since it was just increased yest. Add hydralazine 25 mg q 8 hrs  HLD on simvastatin  CKD stage III-IV creatinine improved to 1.81 today followed by renal.     For questions or updates, please contact [facility] Please consult [email or web address] for contact info under Cardiology/STEMI.      Signed, Ermalinda Barrios, PA-C  05/11/2018, 7:47 AM    Attending note Patient seen and disussed with PA Bonnell Public, I agree with her documentation. Admitted with severe HTN. Mild flat  troponin  not consistent with ACS. Echo this admit shows LVEF has decreased to 25-30%. Renal function has prohibited cath, though significant improvement this AM and trend will continue to be monitored. Ongoing HTN, currently on coreg 12.5mg  bid, hydral 25mg  tid (started this AM), imdur 30, lisinopril 20 (restarted today). Would not titrate ACE further given his renal function. Would monitor on tele while admitted due to his tachycardia.  If bp's reasonable into the afternoon would be ok for discharge today, would need approval from GI, renal, and primary team as well.    Carlyle Dolly MD

## 2018-05-11 NOTE — Consult Note (Addendum)
Referring Provider: Dr. Manuella Ghazi  Primary Care Physician:  Wyatt Haste, NP Primary Gastroenterologist:  Dr. Britta Mccreedy previously in [place].   Date of Admission: 05/07/18 Date of Consultation: 05/11/18  Reason for Consultation:  Anorexia and ongoing GI symptoms  HPI:  Ronnie [name] is a 72 y.o. year old male who presented to the ED with uncontrolled HTN, chest pain, possible acute on chronic renal failure, chronic worsening systolic heart failure with EF 25 to 30%. Not felt to have ACS. GI consulted due to anorexia, dyspepsia.    Notes these symptoms for at least a year or 2. Vomited this morning, but he states this occurred after becoming upset and crying earlier. Notes upper abdominal pain that is intermittent, not really associated with eating. Nothing really relieves except "my grandchild".  Intermittent vomiting, not daily. Notes decreased appetite. Will take pills and then it comes "right back up".  Burping and hiccups. Feels like food is going down the wrong way. Denies solid food dysphagia. Feels like something is moving in his back. Feels full with small amounts of food at times. Taking aspirin at home if pain in back. No other NSAIDs. Has been on Protonix at home. Heme negative this admission.   Highest weight around 152. Now 147. Fluctuates.   Says stress, anxiety is making things worse. Notes multiple psychosocial stressors including death of two stepsons recently, other ongoing loss and stressful situations.He specifically said that he feels stress, anxiety, emotions are playing a big role in his symptoms.   EGD in 2017 by Dr. Britta Mccreedy. No prior colonoscopy. He doesn't remember findings or why this was done.   Past Medical History:  Diagnosis Date  . Chronic combined systolic (congestive) and diastolic (congestive) heart failure (HCC)    a. EF 35 to 40% by echocardiogram in 2014 --> declined cath at that time and did not reestablish care with Cardiology until 04/2018  .  Dyslipidemia   . Hypertension   . Shortness of breath     Past Surgical History:  Procedure Laterality Date  . DENTAL SURGERY      Prior to Admission medications   Medication Sig Start Date End Date Taking? Authorizing Provider  albuterol (PROVENTIL HFA;VENTOLIN HFA) 108 (90 BASE) MCG/ACT inhaler Inhale 2 puffs into the lungs 2 (two) times daily as needed for wheezing or shortness of breath.   Yes [provider]  carvedilol (COREG) 6.25 MG tablet Take 1 tablet (6.25 mg total) by mouth 2 (two) times daily with a meal. 04/28/18  Yes Branch, Alphonse Guild, MD  furosemide (LASIX) 20 MG tablet TAKE 1 TABLET AS NEEDED FOR SWELLING Patient taking differently: Take 40 mg by mouth 2 (two) times daily. TAKE 1 TABLET AS NEEDED FOR SWELLING 04/07/18  Yes Branch, Alphonse Guild, MD  isosorbide mononitrate (IMDUR) 30 MG 24 hr tablet Take 1 tablet by mouth daily. 01/03/18  Yes [provider]  lisinopril (PRINIVIL,ZESTRIL) 10 MG tablet Take 1 tablet (10 mg total) by mouth daily. 04/28/18 07/27/18 Yes BranchAlphonse Guild, MD  Multiple Vitamin (MULTIVITAMIN WITH MINERALS) TABS tablet Take 1 tablet by mouth daily.   Yes [provider]  pantoprazole (PROTONIX) 40 MG tablet Take 40 mg by mouth daily.   Yes [provider]  ranitidine (ZANTAC) 150 MG tablet Take 150 mg by mouth at bedtime.    Yes [provider]  simvastatin (ZOCOR) 40 MG tablet Take 1 tablet (40 mg total) by mouth every evening. 06/17/13  Yes Short, Noah Delaine,  MD  aspirin EC 81 MG EC tablet Take 1 tablet (81 mg total) by mouth daily. 06/17/13   [name], MD    Current Facility-Administered Medications  Medication Dose Route Frequency Provider Last Rate Last Dose  . acetaminophen (TYLENOL) tablet 650 mg  650 mg Oral Q6H PRN Reubin Milan, MD   650 mg at 05/11/18 1000   Or  . acetaminophen (TYLENOL) suppository 650 mg  650 mg Rectal Q6H PRN Reubin Milan, MD      . ALPRAZolam Duanne Moron) tablet  0.25 mg  0.25 mg Oral BID Manuella Ghazi, Pratik D, DO   0.25 mg at 05/11/18 1008  . aspirin EC tablet 81 mg  81 mg Oral Daily Reubin Milan, MD   81 mg at 05/11/18 4656  . carvedilol (COREG) tablet 12.5 mg  12.5 mg Oral BID WC Shah, Pratik D, DO   12.5 mg at 05/11/18 0959  . citalopram (CELEXA) tablet 10 mg  10 mg Oral Daily Manuella Ghazi, Pratik D, DO   10 mg at 05/11/18 1008  . famotidine (PEPCID) tablet 20 mg  20 mg Oral QHS Reubin Milan, MD   20 mg at 05/10/18 2255  . feeding supplement (ENSURE ENLIVE) (ENSURE ENLIVE) liquid 237 mL  237 mL Oral BID BM Reubin Milan, MD   237 mL at 05/09/18 1501  . heparin injection 5,000 Units  5,000 Units Subcutaneous Q8H Shah, Pratik D, DO   5,000 Units at 05/11/18 0631  . hydrALAZINE (APRESOLINE) tablet 25 mg  25 mg Oral Q8H Lenze, Michele M, PA-C      . isosorbide mononitrate (IMDUR) 24 hr tablet 30 mg  30 mg Oral Daily Reubin Milan, MD   30 mg at 05/11/18 0959  . lisinopril (PRINIVIL,ZESTRIL) tablet 20 mg  20 mg Oral Daily Manuella Ghazi, Pratik D, DO   20 mg at 05/11/18 1000  . ondansetron (ZOFRAN) tablet 4 mg  4 mg Oral Q6H PRN Reubin Milan, MD       Or  . ondansetron [facility]) injection 4 mg  4 mg Intravenous Q6H PRN Reubin Milan, MD   4 mg at 05/11/18 0959  . pantoprazole (PROTONIX) EC tablet 40 mg  40 mg Oral Daily Reubin Milan, MD   40 mg at 05/11/18 0959  . simvastatin (ZOCOR) tablet 40 mg  40 mg Oral QPM Reubin Milan, MD   40 mg at 05/10/18 1956  . sodium chloride (OCEAN) 0.65 % nasal spray 1 spray  1 spray Each Nare PRN Manuella Ghazi, Pratik D, DO   1 spray at 05/11/18 0216    Allergies as of 05/07/2018 - Review Complete 05/07/2018  Allergen Reaction Noted  . Bee venom Shortness Of Breath and Swelling 05/07/2018    Family History  Problem Relation Age of Onset  . Heart disease Mother   . Stomach cancer Father   . Cervical cancer Sister   . Prostate cancer Brother   . Colon cancer Neg Hx   . Colon polyps Neg Hx      Social History   Socioeconomic History  . Marital status: Married    Spouse name: Not on file  . Number of children: Not on file  . Years of education: Not on file  . Highest education level: Not on file  Occupational History  . Not on file  Social [name]  . Financial resource strain: Not on file  . Food insecurity:    Worry: Not on file  Inability: Not on file  . Transportation [name]:    Medical: Not on file    Non-medical: Not on file  Tobacco Use  . Smoking status: Former Smoker    Packs/day: 0.50    Years: 50.00    Pack years: 25.00    Types: Cigarettes    Last attempt to quit: 03/29/2018    Years since quitting: 0.1  . Smokeless tobacco: Never Used  . Tobacco comment: occasional cigarette  Substance and Sexual Activity  . Alcohol use: No  . Drug use: No  . Sexual activity: Not on file  Lifestyle  . Physical activity:    Days per week: Not on file    Minutes per session: Not on file  . Stress: Not on file  Relationships  . Social connections:    Talks on phone: Not on file    Gets together: Not on file    Attends religious service: Not on file    Active member of club or organization: Not on file    Attends meetings of clubs or organizations: Not on file    Relationship status: Not on file  . Intimate partner violence:    Fear of current or ex partner: Not on file    Emotionally abused: Not on file    Physically abused: Not on file    Forced sexual activity: Not on file  Other Topics Concern  . Not on file  Social History Narrative  . Not on file    Review of Systems: Gen:see HPI  CV: Denies chest pain, heart palpitations, syncope, edema  Resp: +SOB with exertion, anxiety  GI: see HPI  GU : Denies urinary burning, urinary frequency, urinary incontinence.  MS: Denies joint pain,swelling, cramping Derm: Denies rash, itching, dry skin Psych: see HPI  Heme: Denies bruising, bleeding, and enlarged lymph nodes.  Physical Exam: Vital signs in  last 24 hours: Temp:  [98.4 F (36.9 C)-98.7 F (37.1 C)] 98.4 F (36.9 C) (08/05 0634) Pulse Rate:  [80-111] 102 (08/05 0634) Resp:  [18-19] 18 (08/05 0634) BP: (166-188)/(94-111) 177/100 (08/05 0634) SpO2:  [97 %-100 %] 98 % (08/05 0634) Weight:  [147 lb 14.9 oz (67.1 kg)] 147 lb 14.9 oz (67.1 kg) (08/05 0500) Last BM Date: 05/10/18 General:   Alert,  Well-developed, well-nourished, pleasant and cooperative in NAD Head:  Normocephalic and atraumatic. Eyes:  Sclera clear, no icterus.   Conjunctiva pink. Ears:  Normal auditory acuity. Nose:  No deformity, discharge,  or lesions. Mouth:  No deformity or lesions Lungs:  Clear throughout to auscultation. Heart:  Mildly tachycardic, soft murmur Abdomen:  Soft, TTP LUQ and nondistended. No masses, hepatosplenomegaly or hernias noted. Normal bowel sounds, without guarding, and without rebound.   Rectal:  Deferred  Msk:  Symmetrical without gross deformities. Normal posture Extremities:  Without edema. Neurologic:  Alert and  oriented x4 Psych:  Alert and cooperative. Normal mood and affect.  Intake/Output from previous day: 08/04 0701 - 08/05 0700 In: 613 [P.O.:120; I.V.:493] Out: 1925 [Urine:1925] Intake/Output this shift: No intake/output data recorded.  Lab Results: Recent Labs    05/09/18 0548 05/11/18 0622  WBC 6.0 8.1  HGB 9.5* 9.3*  HCT 29.7* 28.4*  PLT 322 328   BMET Recent Labs    05/09/18 0548 05/10/18 0707 05/11/18 0622  NA 139 140 140  K 4.1 4.0 3.8  CL 106 110 109  CO2 26 25 23   GLUCOSE 105* 91 98  BUN 35* 32* 29*  CREATININE 2.37*  2.18* 1.81*  CALCIUM 8.6* 8.8* 9.4   LFT Recent Labs    05/09/18 0548 05/10/18 0707 05/11/18 0622  PROT 7.2  --   --   ALBUMIN 3.2* 3.1* 3.4*  AST 29  --   --   ALT 12  --   --   ALKPHOS 78  --   --   BILITOT 0.5  --   --    Hepatitis Panel Recent Labs    05/09/18 0954  HEPBSAG Negative    Impression: 72 year old male admitted with atypical chest pain,  acute on chronic renal failure, labile hypertension, clinically improving since admission. GI consult requested due to constellation of upper GI symptoms. Multiple psychosocial stressors that he feels aggravates nausea and vomiting, but he also notes decreased appetite, notably belches/hiccups, and has had no improvement with PPI. Last EGD reportedly by Dr. Britta Mccreedy, which will attempt to retrieve from 2017. Celexa and Xanax have been started per attending physician. He reports 1-2 year history of upper GI symptoms. Exacerbation of symptoms likely multifactorial in setting of acute illness, stress, but EGD would be helpful in future as he notes a chronic history even prior to hospitalization. Will ensure PPI is taken on an empty stomach in the morning prior to breakfast for best efficacy and add Zofran before meals. Consider EGD while inpatient vs outpatient.   Anemia: chronic disease. Ferritin normal at 113.   Plan: Zofran scheduled Change PPI to prior to breakfast daily Consider EGD while inpatient Discussed with patient outpatient referral for counseling, which he agrees may be helpful Elective initial screening colonoscopy as outpatient   Ronnie Needs, PhD, ANP-BC [facility]      LOS: 0 days    05/11/2018, 1:33 PM

## 2018-05-11 NOTE — Progress Notes (Addendum)
Subjective: Interval History: Patient is complaining of nose bleeding.  He has some pain on his hand and legs.  He has also some weakness.  He denies any difficulty breathing.  Objective: Vital signs in last 24 hours: Temp:  [98.4 F (36.9 C)-98.7 F (37.1 C)] 98.4 F (36.9 C) (08/05 0634) Pulse Rate:  [80-111] 102 (08/05 0634) Resp:  [18-19] 18 (08/05 0634) BP: (139-188)/(78-111) 177/100 (08/05 0634) SpO2:  [97 %-100 %] 98 % (08/05 0634) Weight:  [67.1 kg (147 lb 14.9 oz)] 67.1 kg (147 lb 14.9 oz) (08/05 0500) Weight change: 0.693 kg (1 lb 8.5 oz)  Intake/Output from previous day: 08/04 0701 - 08/05 0700 In: 613 [P.O.:120; I.V.:493] Out: 1925 [Urine:1925] Intake/Output this shift: No intake/output data recorded.  General appearance: alert, cooperative and no distress Resp: clear to auscultation bilaterally Cardio: regular rate and rhythm Extremities: No edema  Lab Results: Recent Labs    05/09/18 0548 05/11/18 0622  WBC 6.0 8.1  HGB 9.5* 9.3*  HCT 29.7* 28.4*  PLT 322 328   BMET:  Recent Labs    05/10/18 0707 05/11/18 0622  NA 140 140  K 4.0 3.8  CL 110 109  CO2 25 23  GLUCOSE 91 98  BUN 32* 29*  CREATININE 2.18* 1.81*  CALCIUM 8.8* 9.4   No results for input(s): PTH in the last 72 hours. Iron Studies:  Recent Labs    05/08/18 0925  IRON 82  TIBC 277  FERRITIN 127    Studies/Results: US Renal  Result Date: 05/09/2018 CLINICAL DATA:  Acute renal injury EXAM: RENAL / URINARY TRACT ULTRASOUND COMPLETE COMPARISON:  CT from 11/16/2017, ultrasound from 09/07/2015 FINDINGS: Right Kidney: Length: 11.9 cm. Multiple cysts are identified. The largest of these measures 8.6 cm. These changes are stable from the prior exam. Diffuse increased echogenicity is noted. Left Kidney: Length: 11.6 cm. Cystic lesions are noted. The largest of these measures 6.7 cm inferiorly. This is also stable from the prior CT. Mild increased echogenicity is noted. Bladder: Appears normal  for degree of bladder distention. IMPRESSION: Bilateral renal cystic change stable from the prior exam. Mild increased echogenicity within the kidneys similar to that seen on prior exam. Electronically Signed   By: Inez Catalina M.D.   On: 05/09/2018 13:33    I have reviewed the patient's current medications.  Assessment/Plan: 1] renal failure: Possibly acute on chronic.  His renal function continue to improve.  Presently patient is nonoliguric.  Ultrasound of the kidneys showed bilateral multiple cyst.  Patient possibly might have polycystic kidney disease[ ADPKD] ? But no family histroy.  Presently he did not have any nausea or vomiting. 2] bone and mineral disorder: His calcium and phosphorus is in range 3] proteinuria: None nephrotic range.  Complement is normal.  Hepatitis B surface antigen and hepatitis C antibody is pending.  4] hypertension: His blood pressure is  high. 5] history of CHF: Patient with severe systolic dysfunction.  He had 1900 cc of urine output. 6] anemia: Patient hemoglobin  is below our target goal.  This could be secondary to iron deficiency anemia or anemia of chronic disease.  Iron studies is pending. Plan: 1] we will start patient on lisinopril 20 mg once a day 2] DC IV fluid 3] we will check renal panel in the morning. 4] if patient is going to discharge we will see him in 4 weeks a s an outpatient.   LOS: 0 days   Sharad Vaneaton S 05/11/2018,8:09 AM

## 2018-05-11 NOTE — Progress Notes (Signed)
PROGRESS NOTE    Ronnie [name]  AOZ:[id number] DOB: 1946-02-21 DOA: 05/07/2018 PCP: Ronnie Haste, NP   Brief Narrative:   Ronnie [name] a 72 y.o.malewith medical history significant ofhyperlipidemia, hypertension, chronic combined systolic and diastolic heart failure who is referred to the emergency department by his PCP due to uncontrolled hypertension.He also has been having episodes of pressure like CP associated with dyspnea, lightheadedness and nausea.He has been seen by Cardiology with no ACS noted, but rather poorly controlled BP. He appears to also have AKI vs. Progression of CKD. He continues to have some ongoing nausea and poor appetite, but renal function appears to be improving.  His blood pressures continue to remain elevated and blood pressure agents have been added on today for better control.  He remains somewhat anxious and sad and continues to have trouble with appetite despite improvement in his uremia for which GI has been consulted.   Assessment & Plan:  Principal Problem: Elevated troponin Active Problems: Chest pain Chronic combined systolic and diastolic CHF (congestive heart failure) (HCC) Hyperlipidemia Essential hypertension, malignant Chronic kidney disease, stage III (moderate) (HCC) Anemia Malnutrition of moderate degree   1. Atypical chest pain with elevated troponin. Resolved with improvement in BP readings. [facility] evaluation. Likely related to demand ischemia with accelerated HTN. 2D echowith diminished EF noted. 2. AKI vs progression of CKD 3-improving. Pt has symptoms of uremia and likely progression to CKD4.  IV fluid to discontinue today and will monitor repeat renal panel in a.m.  Iron studies pending.  Renal ultrasound with multiple cysts noted.  We will follow-up with nephrology in the outpatient setting once discharged. 3. Persistent vague GI symptoms with anorexia. Pt on PPI and H2 blocker  with no signs of PUD. Also, RUQ U/S with no acute findings. Consulted GI today for endoscopy as he may have peptic ulcer disease or possible H. pylori. 4. Labile hypertension.  Continue current medications with home lisinopril restarted at a dose of 20 mg at this time.  Coreg dose doubled to 12.5 mg twice daily and hydralazine p.o. added by cardiology. 5. Anxiety/depression.  Will start on Celexa 10 mg daily along with Xanax twice daily for now.  Hopefully this will also improve his appetite. 6. Anemia-stable. Stable and likely chronic. 7. Dyslipidemia. Continue simvastatin daily.   DVT prophylaxis:Heparin Code Status:Full Family Communication:None at bedside Disposition Plan:Begin evaluation for dysphasia by GI.  AKI has been improving.  Blood pressure control.   Consultants:  Cardiology  Nephrology  GI  Procedures:  2D echocardiogramwith LVEF 25-30%; on 05/08/2018  Antimicrobials:   None   Subjective: Patient seen and evaluated today with ongoing appetite related issues as well as some sense of nausea and dysphasia.  He is tearful and anxious family issues at home.  No acute overnight events noted.  Objective: Vitals:   05/11/18 0026 05/11/18 0212 05/11/18 0500 05/11/18 0634  BP: (!) 188/99 (!) 166/94  (!) 177/100  Pulse: (!) 103 (!) 107  (!) 102  Resp:    18  Temp:    98.4 F (36.9 C)  TempSrc:    Oral  SpO2:  99%  98%  Weight:   67.1 kg (147 lb 14.9 oz)   Height:        Intake/Output Summary (Last 24 hours) at 05/11/2018 0921 Last data filed at 05/11/2018 0700 Gross per 24 hour  Intake 613 ml  Output 1350 ml  Net -737 ml   Filed Weights   05/09/18 0546 05/10/18  0500 05/11/18 0500  Weight: 67.4 kg (148 lb 9.4 oz) 66.4 kg (146 lb 6.4 oz) 67.1 kg (147 lb 14.9 oz)    Examination:  General exam: Anxious and tearful. Respiratory system: Clear to auscultation. Respiratory effort normal. Cardiovascular system: S1 & S2 heard, RRR. No JVD, murmurs,  rubs, gallops or clicks. No pedal edema. Gastrointestinal system: Abdomen is nondistended, soft and nontender. No organomegaly or masses felt. Normal bowel sounds heard. Central nervous system: Alert and oriented. No focal neurological deficits. Extremities: Symmetric 5 x 5 power. Skin: No rashes, lesions or ulcers Psychiatry: Mood and affect depressed.    Data Reviewed: I have personally reviewed following labs and imaging studies  CBC: Recent Labs  Lab 05/07/18 1800 05/08/18 0310 05/09/18 0548 05/11/18 0622  WBC 7.1 7.1 6.0 8.1  NEUTROABS 4.5  --   --   --   HGB 9.5* 9.0* 9.5* 9.3*  HCT 29.5* 27.8* 29.7* 28.4*  MCV 90.8 90.3 90.8 90.4  PLT 344 286 322 412   Basic Metabolic Panel: Recent Labs  Lab 05/07/18 1800 05/08/18 0310 05/08/18 1517 05/09/18 0548 05/10/18 0707 05/11/18 0622  NA 140 134*  --  139 140 140  K 4.9 4.3  --  4.1 4.0 3.8  CL 105 101  --  106 110 109  CO2 25 25  --  26 25 23   GLUCOSE 107* 161*  --  105* 91 98  BUN 41* 38*  --  35* 32* 29*  CREATININE 2.33* 2.28*  --  2.37* 2.18* 1.81*  CALCIUM 9.4 8.8*  --  8.6* 8.8* 9.4  PHOS  --   --  4.3  --  3.4 2.2*   GFR: Estimated Creatinine Clearance: 35 mL/min (A) (by C-G formula based on SCr of 1.81 mg/dL (H)). Liver Function Tests: Recent Labs  Lab 05/07/18 1800 05/09/18 0548 05/10/18 0707 05/11/18 0622  AST 35 29  --   --   ALT 14 12  --   --   ALKPHOS 85 78  --   --   BILITOT 0.4 0.5  --   --   PROT 7.8 7.2  --   --   ALBUMIN 3.7 3.2* 3.1* 3.4*   Recent Labs  Lab 05/07/18 1800 05/08/18 0925  LIPASE 62* 55*   No results for input(s): AMMONIA in the last 168 hours. Coagulation Profile: No results for input(s): INR, PROTIME in the last 168 hours. Cardiac Enzymes: Recent Labs  Lab 05/07/18 1800 05/07/18 2109 05/08/18 0310 05/08/18 0925 05/08/18 1517  TROPONINI 0.10* 0.10* 0.09* 0.10* 0.09*   BNP (last 3 results) No results for input(s): PROBNP in the last 8760 hours. HbA1C: No  results for input(s): HGBA1C in the last 72 hours. CBG: No results for input(s): GLUCAP in the last 168 hours. Lipid Profile: No results for input(s): CHOL, HDL, LDLCALC, TRIG, CHOLHDL, LDLDIRECT in the last 72 hours. Thyroid Function Tests: No results for input(s): TSH, T4TOTAL, FREET4, T3FREE, THYROIDAB in the last 72 hours. Anemia Panel: Recent Labs    05/08/18 0925  VITAMINB12 653  FOLATE 31.0  FERRITIN 127  TIBC 277  IRON 82  RETICCTPCT 1.1   Sepsis Labs: No results for input(s): PROCALCITON, LATICACIDVEN in the last 168 hours.  No results found for this or any previous visit (from the past 240 hour(s)).       Radiology Studies: US Renal  Result Date: 05/09/2018 CLINICAL DATA:  Acute renal injury EXAM: RENAL / URINARY TRACT ULTRASOUND COMPLETE COMPARISON:  CT  from 11/16/2017, ultrasound from 09/07/2015 FINDINGS: Right Kidney: Length: 11.9 cm. Multiple cysts are identified. The largest of these measures 8.6 cm. These changes are stable from the prior exam. Diffuse increased echogenicity is noted. Left Kidney: Length: 11.6 cm. Cystic lesions are noted. The largest of these measures 6.7 cm inferiorly. This is also stable from the prior CT. Mild increased echogenicity is noted. Bladder: Appears normal for degree of bladder distention. IMPRESSION: Bilateral renal cystic change stable from the prior exam. Mild increased echogenicity within the kidneys similar to that seen on prior exam. Electronically Signed   By: Inez Catalina M.D.   On: 05/09/2018 13:33        Scheduled Meds: . ALPRAZolam  0.25 mg Oral BID  . aspirin EC  81 mg Oral Daily  . carvedilol  12.5 mg Oral BID WC  . citalopram  10 mg Oral Daily  . famotidine  20 mg Oral QHS  . feeding supplement (ENSURE ENLIVE)  237 mL Oral BID BM  . heparin injection (subcutaneous)  5,000 Units Subcutaneous Q8H  . hydrALAZINE  25 mg Oral Q8H  . isosorbide mononitrate  30 mg Oral Daily  . lisinopril  20 mg Oral Daily  .  pantoprazole  40 mg Oral Daily  . simvastatin  40 mg Oral QPM   Continuous Infusions:   LOS: 0 days    Time spent: 30 minutes    Emelyn Roen Darleen Crocker, DO [facility] Pager [phone]  If 7PM-7AM, please contact night-coverage [email or web address] Password [email or web address] 05/11/2018, 9:21 AM

## 2018-05-12 ENCOUNTER — Inpatient Hospital Stay (HOSPITAL_COMMUNITY): Payer: Medicare Other

## 2018-05-12 ENCOUNTER — Telehealth: Payer: Self-pay | Admitting: Gastroenterology

## 2018-05-12 ENCOUNTER — Encounter (HOSPITAL_COMMUNITY): Payer: Self-pay | Admitting: Anesthesiology

## 2018-05-12 ENCOUNTER — Encounter (HOSPITAL_COMMUNITY)
Admission: EM | Disposition: A | Discharge status: Home / Self Care | Payer: Self-pay | Source: Home / Self Care | Attending: Internal Medicine

## 2018-05-12 ENCOUNTER — Encounter (HOSPITAL_COMMUNITY): Payer: Self-pay

## 2018-05-12 DIAGNOSIS — I169 Hypertensive crisis, unspecified: Secondary | ICD-10-CM | POA: Diagnosis not present

## 2018-05-12 DIAGNOSIS — R1013 Epigastric pain: Secondary | ICD-10-CM | POA: Diagnosis not present

## 2018-05-12 DIAGNOSIS — R748 Abnormal levels of other serum enzymes: Secondary | ICD-10-CM | POA: Diagnosis not present

## 2018-05-12 DIAGNOSIS — R0789 Other chest pain: Secondary | ICD-10-CM | POA: Diagnosis not present

## 2018-05-12 LAB — RENAL FUNCTION PANEL
ANION GAP: 7 (ref 5–15)
Albumin: 3.1 g/dL — ABNORMAL LOW (ref 3.5–5.0)
BUN: 34 mg/dL — ABNORMAL HIGH (ref 8–23)
CALCIUM: 9.2 mg/dL (ref 8.9–10.3)
CO2: 25 mmol/L (ref 22–32)
CREATININE: 2.11 mg/dL — AB (ref 0.61–1.24)
Chloride: 109 mmol/L (ref 98–111)
GFR calc non Af Amer: 30 mL/min — ABNORMAL LOW (ref >60)
GFR, EST AFRICAN AMERICAN: 34 mL/min — AB (ref >60)
Glucose, Bld: 97 mg/dL (ref 70–99)
Phosphorus: 3.2 mg/dL (ref 2.5–4.6)
Potassium: 4.4 mmol/L (ref 3.5–5.1)
SODIUM: 141 mmol/L (ref 135–145)

## 2018-05-12 LAB — BASIC METABOLIC PANEL
Anion gap: 6 (ref 5–15)
BUN: 33 mg/dL — ABNORMAL HIGH (ref 8–23)
CALCIUM: 9.2 mg/dL (ref 8.9–10.3)
CO2: 26 mmol/L (ref 22–32)
CREATININE: 2.09 mg/dL — AB (ref 0.61–1.24)
Chloride: 109 mmol/L (ref 98–111)
GFR calc non Af Amer: 30 mL/min — ABNORMAL LOW (ref >60)
GFR, EST AFRICAN AMERICAN: 35 mL/min — AB (ref >60)
Glucose, Bld: 97 mg/dL (ref 70–99)
Potassium: 4.4 mmol/L (ref 3.5–5.1)
SODIUM: 141 mmol/L (ref 135–145)

## 2018-05-12 LAB — COMPLEMENT, TOTAL: Compl, Total (CH50): >60 U/mL (ref 42–999999)

## 2018-05-12 LAB — HEPATITIS C ANTIBODY: HCV Ab: <0.1 s/co ratio (ref 0.0–0.9)

## 2018-05-12 SURGERY — ESOPHAGOGASTRODUODENOSCOPY (EGD) WITH PROPOFOL
Anesthesia: Monitor Anesthesia Care

## 2018-05-12 MED ORDER — HYDROCODONE-ACETAMINOPHEN 7.5-325 MG PO TABS: 1.0000 | ORAL_TABLET | Freq: Once | ORAL | Status: DC | PRN

## 2018-05-12 MED ORDER — FENTANYL CITRATE (PF) 100 MCG/2ML IJ SOLN: 25.0000 ug | INTRAMUSCULAR | Status: DC | PRN

## 2018-05-12 MED ORDER — SODIUM CHLORIDE 0.9 % IV SOLN: INTRAVENOUS | Status: DC

## 2018-05-12 MED ORDER — CITALOPRAM HYDROBROMIDE 10 MG PO TABS: 10.0000 mg | ORAL_TABLET | Freq: Every day | ORAL | 0 refills | Status: DC

## 2018-05-12 MED ORDER — ONDANSETRON HCL 4 MG PO TABS: 4.0000 mg | ORAL_TABLET | Freq: Four times a day (QID) | ORAL | 0 refills | Status: DC | PRN

## 2018-05-12 MED ORDER — HYDRALAZINE HCL 25 MG PO TABS: 25.0000 mg | ORAL_TABLET | Freq: Three times a day (TID) | ORAL | 0 refills | Status: DC

## 2018-05-12 MED ORDER — TECHNETIUM TC 99M MEBROFENIN IV KIT
5.0000 | PACK | Freq: Once | INTRAVENOUS | Status: AC | PRN
  Administered 2018-05-12: 5.1 via INTRAVENOUS

## 2018-05-12 MED ORDER — CARVEDILOL 12.5 MG PO TABS: 12.5000 mg | ORAL_TABLET | Freq: Two times a day (BID) | ORAL | 0 refills | Status: DC

## 2018-05-12 MED ORDER — LACTATED RINGERS IV SOLN: INTRAVENOUS | Status: DC

## 2018-05-12 MED ORDER — LISINOPRIL 20 MG PO TABS: 20.0000 mg | ORAL_TABLET | Freq: Every day | ORAL | 0 refills | Status: DC

## 2018-05-12 NOTE — Progress Notes (Signed)
Heart rates improved, bp's trending down. Would hold with current regimen due to multiple recent changes, can titrate further as outpatient. No additional cardiac recs at this time, we will arrange f/u    [place] will sign off.   Medication Recommendations:  Continue current regimen Other recommendations (labs, testing, etc):  none Follow up as an outpatient: keep appt 05/14/18     Carlyle Dolly MD

## 2018-05-12 NOTE — Progress Notes (Signed)
Has had no c/o nausea today.  Had hepatobiliary scan and will have EGD tomorrow.

## 2018-05-12 NOTE — Discharge Summary (Signed)
Physician Discharge Summary  Ronnie [name] VQM:[id number] DOB: [birth year] DOA: 05/07/2018  PCP: Wyatt Haste, NP  Admit date: 05/07/2018  Discharge date: 05/12/2018  Admitted From:Home  Disposition:  Home  Recommendations for Outpatient Follow-up:  1. Follow up with PCP in 1-2 weeks 2. Follow up with GI Dr. Oneida Alar in 2 weeks 3. Follow up with Dr. Lowanda Foster in 4 weeks 4. Follow up with Cardiology as scheduled  Home Health:N/A  Equipment/Devices:N/A  Discharge Condition:Stable  CODE STATUS: Full  Diet recommendation: Heart Healthy  Brief/Interim Summary:  Ronnie Shea Hairstonis a 72 y.o.malewith medical history significant ofhyperlipidemia, hypertension, chronic combined systolic and diastolic heart failure who is referred to the emergency department by his PCP due to uncontrolled hypertension.He also has been having episodes of pressure like CP associated with dyspnea, lightheadedness and nausea.He has been seen by Cardiology with no ACS noted, but rather poorly controlled BP. He appears to also have AKI vs. Progression of CKD. He continues to have some ongoing nausea and poor appetite, butrenal function appears to be improving. His blood pressures continue to remain elevatedand blood pressure agents have been added on today for better control.  Cardiology has signed off and nephrology plans to follow in the outpatient setting.  HIDA scan with no acute abnormalities and EF 83% noted on day of discharge. Discussed with Dr. Oneida Alar of GI who will follow up with patient in two weeks for endoscopy at that time. He has been started on Celexa for his anxiety and depression.    Discharge Diagnoses:  Principal Problem:   Elevated troponin Active Problems:   Chest pain   Chronic combined systolic and diastolic CHF (congestive heart failure) (HCC)   Hyperlipidemia   Essential hypertension, malignant   Chronic kidney disease, stage III (moderate) (HCC)   Anemia   Malnutrition of  moderate degree   Dyspepsia    Discharge Instructions  Discharge Instructions    Diet - low sodium heart healthy   Complete by:  As directed    Increase activity slowly   Complete by:  As directed      Allergies as of 05/12/2018      Reactions   Bee Venom Shortness Of Breath, Swelling      Medication List    TAKE these medications   albuterol 108 (90 Base) MCG/ACT inhaler Commonly known as:  PROVENTIL HFA;VENTOLIN HFA Inhale 2 puffs into the lungs 2 (two) times daily as needed for wheezing or shortness of breath.   aspirin 81 MG EC tablet Take 1 tablet (81 mg total) by mouth daily.   carvedilol 12.5 MG tablet Commonly known as:  COREG Take 1 tablet (12.5 mg total) by mouth 2 (two) times daily with a meal. What changed:    medication strength  how much to take   citalopram 10 MG tablet Commonly known as:  CELEXA Take 1 tablet (10 mg total) by mouth daily. Start taking on:  05/13/2018   furosemide 20 MG tablet Commonly known as:  LASIX TAKE 1 TABLET AS NEEDED FOR SWELLING What changed:    how much to take  how to take this  when to take this  additional instructions   hydrALAZINE 25 MG tablet Commonly known as:  APRESOLINE Take 1 tablet (25 mg total) by mouth every 8 (eight) hours.   isosorbide mononitrate 30 MG 24 hr tablet Commonly known as:  IMDUR Take 1 tablet by mouth daily.   lisinopril 20 MG tablet Commonly known as:  PRINIVIL,ZESTRIL Take 1  tablet (20 mg total) by mouth daily. Start taking on:  05/13/2018 What changed:    medication strength  how much to take   multivitamin with minerals Tabs tablet Take 1 tablet by mouth daily.   ondansetron 4 MG tablet Commonly known as:  ZOFRAN Take 1 tablet (4 mg total) by mouth every 6 (six) hours as needed for nausea.   pantoprazole 40 MG tablet Commonly known as:  PROTONIX Take 40 mg by mouth daily.   ranitidine 150 MG tablet Commonly known as:  ZANTAC Take 150 mg by mouth at bedtime.    simvastatin 40 MG tablet Commonly known as:  ZOCOR Take 1 tablet (40 mg total) by mouth every evening.      Follow-up Information    Fran Lowes, MD Follow up in 4 week(s).   Specialty:  Nephrology Contact information: [address] [address] [place], NP Follow up in 1 week(s).   Contact information: [place] [address] [phone]        Arnoldo Lenis, MD .   Specialty:  Cardiology Contact information: [address] [place] [address] [phone]        Danie Binder, MD Follow up in 2 week(s).   Specialty:  Gastroenterology Why:  endoscopy Contact information: [place] [address] [phone]          Allergies  Allergen Reactions  . Bee Venom Shortness Of Breath and Swelling    Consultations:  GI  Cardiology  Nephrology   Procedures/Studies: Dg Chest 2 View  Result Date: 05/07/2018 CLINICAL DATA:  Generalized chest pain since this morning, shortness of breath, history COPD, hypertension, former smoker EXAM: CHEST - 2 VIEW COMPARISON:  11/16/2017 FINDINGS: Upper normal heart size. Mediastinal contours and pulmonary vascularity normal. Lungs clear. No pleural effusion or pneumothorax. Bones unremarkable. IMPRESSION: No acute abnormalities. Electronically Signed   By: Lavonia Dana M.D.   On: 05/07/2018 18:18   US Renal  Result Date: 05/09/2018 CLINICAL DATA:  Acute renal injury EXAM: RENAL / URINARY TRACT ULTRASOUND COMPLETE COMPARISON:  CT from 11/16/2017, ultrasound from 09/07/2015 FINDINGS: Right Kidney: Length: 11.9 cm. Multiple cysts are identified. The largest of these measures 8.6 cm. These changes are stable from the prior exam. Diffuse increased echogenicity is noted. Left Kidney: Length: 11.6 cm. Cystic lesions are noted. The largest of these measures 6.7 cm inferiorly. This is also stable from the prior CT. Mild increased  echogenicity is noted. Bladder: Appears normal for degree of bladder distention. IMPRESSION: Bilateral renal cystic change stable from the prior exam. Mild increased echogenicity within the kidneys similar to that seen on prior exam. Electronically Signed   By: Inez Catalina M.D.   On: 05/09/2018 13:33   Nm Hepato W/eject Fract  Result Date: 05/12/2018 CLINICAL DATA:  Nausea and vomiting for 6 months. Abdominal pain. No symptoms with administration of Ensure. EXAM: NUCLEAR MEDICINE HEPATOBILIARY IMAGING WITH GALLBLADDER EF TECHNIQUE: Sequential images of the abdomen were obtained out to 60 minutes following intravenous administration of radiopharmaceutical. After oral ingestion of 8 ounces of Half-and-Half cream, gallbladder ejection fraction was determined. RADIOPHARMACEUTICALS:  5.1 mCi Tc-73m Choletec IV COMPARISON:  CT of the abdomen and pelvis on 11/16/2017 FINDINGS: Prompt uptake and biliary excretion of activity by the liver is seen. Gallbladder activity is visualized, consistent with patency of cystic duct. Biliary activity passes into small bowel, consistent with  patent common bile duct. Calculated gallbladder ejection fraction is 83%. (Normal gallbladder ejection fraction with half-and-half is greater than 33%.) IMPRESSION: Normal hepatobiliary exam. Normal gallbladder ejection fraction of 83%. Electronically Signed   By: Nolon Nations M.D.   On: 05/12/2018 13:16   US Abdomen Limited Ruq  Result Date: 05/08/2018 CLINICAL DATA:  Chronic right upper quadrant pain EXAM: ULTRASOUND ABDOMEN LIMITED RIGHT UPPER QUADRANT COMPARISON:  CT 11/16/2017, HIDA scan 10/30/2017, ultrasound 10/30/2017 FINDINGS: Gallbladder: No gallstones or wall thickening visualized. No sonographic Murphy sign noted by sonographer. Common bile duct: Diameter: 4 mm Liver: No focal lesion identified. Within normal limits in parenchymal echogenicity. Portal vein is patent on color Doppler imaging with normal direction of blood flow  towards the liver. Incidental note made of multiple right renal cysts. Right kidney appears echogenic. Cysts measure up to 4.1 cm. IMPRESSION: 1. Negative for gallbladder stones or biliary dilatation 2. Echogenic right kidney suggests medical renal disease. There are multiple cysts in the right kidney. Electronically Signed   By: Donavan Foil M.D.   On: 05/08/2018 15:23     Discharge Exam: Vitals:   05/12/18 0600 05/12/18 1452  BP: (!) 148/90 129/76  Pulse: 90 83  Resp:  16  Temp: 98 F (36.7 C) 97.8 F (36.6 C)  SpO2: 99% 100%   Vitals:   05/11/18 1808 05/11/18 2219 05/12/18 0600 05/12/18 1452  BP: (!) 151/88 (!) 152/95 (!) 148/90 129/76  Pulse: 92 97 90 83  Resp:  19  16  Temp:  98.3 F (36.8 C) 98 F (36.7 C) 97.8 F (36.6 C)  TempSrc:  Oral Oral Oral  SpO2:  100% 99% 100%  Weight:   67.7 kg (149 lb 4 oz)   Height:        General: Pt is alert, awake, not in acute distress Cardiovascular: RRR, S1/S2 +, no rubs, no gallops Respiratory: CTA bilaterally, no wheezing, no rhonchi Abdominal: Soft, NT, ND, bowel sounds + Extremities: no edema, no cyanosis    The results of significant diagnostics from this hospitalization (including imaging, microbiology, ancillary and laboratory) are listed below for reference.     Microbiology: No results found for this or any previous visit (from the past 240 hour(s)).   Labs: BNP (last 3 results) Recent Labs    05/07/18 1800  BNP 0,093.8*   Basic Metabolic Panel: Recent Labs  Lab 05/08/18 0310 05/08/18 1517 05/09/18 0548 05/10/18 0707 05/11/18 0622 05/12/18 0550  NA 134*  --  139 140 140 141  141  K 4.3  --  4.1 4.0 3.8 4.4  4.4  CL 101  --  106 110 109 109  109  CO2 25  --  26 25 23 25  26   GLUCOSE 161*  --  105* 91 98 97  97  BUN 38*  --  35* 32* 29* 34*  33*  CREATININE 2.28*  --  2.37* 2.18* 1.81* 2.11*  2.09*  CALCIUM 8.8*  --  8.6* 8.8* 9.4 9.2  9.2  PHOS  --  4.3  --  3.4 2.2* 3.2   Liver Function  Tests: Recent Labs  Lab 05/07/18 1800 05/09/18 0548 05/10/18 0707 05/11/18 0622 05/12/18 0550  AST 35 29  --   --   --   ALT 14 12  --   --   --   ALKPHOS 85 78  --   --   --   BILITOT 0.4 0.5  --   --   --  PROT 7.8 7.2  --   --   --   ALBUMIN 3.7 3.2* 3.1* 3.4* 3.1*   Recent Labs  Lab 05/07/18 1800 05/08/18 0925  LIPASE 62* 55*   No results for input(s): AMMONIA in the last 168 hours. CBC: Recent Labs  Lab 05/07/18 1800 05/08/18 0310 05/09/18 0548 05/11/18 0622  WBC 7.1 7.1 6.0 8.1  NEUTROABS 4.5  --   --   --   HGB 9.5* 9.0* 9.5* 9.3*  HCT 29.5* 27.8* 29.7* 28.4*  MCV 90.8 90.3 90.8 90.4  PLT 344 286 322 328   Cardiac Enzymes: Recent Labs  Lab 05/07/18 1800 05/07/18 2109 05/08/18 0310 05/08/18 0925 05/08/18 1517  TROPONINI 0.10* 0.10* 0.09* 0.10* 0.09*   BNP: Invalid input(s): POCBNP CBG: No results for input(s): GLUCAP in the last 168 hours. D-Dimer No results for input(s): DDIMER in the last 72 hours. Hgb A1c No results for input(s): HGBA1C in the last 72 hours. Lipid Profile No results for input(s): CHOL, HDL, LDLCALC, TRIG, CHOLHDL, LDLDIRECT in the last 72 hours. Thyroid function studies No results for input(s): TSH, T4TOTAL, T3FREE, THYROIDAB in the last 72 hours.  Invalid input(s): FREET3 Anemia work up Recent Labs    05/11/18 0622  FERRITIN 113  TIBC 270  IRON 62   Urinalysis    Component Value Date/Time   COLORURINE YELLOW 06/15/2013 2121   APPEARANCEUR CLEAR 06/15/2013 2121   LABSPEC 1.014 06/15/2013 2121   PHURINE 5.5 06/15/2013 2121   GLUCOSEU NEGATIVE 06/15/2013 2121   HGBUR NEGATIVE 06/15/2013 2121   [place] NEGATIVE 06/15/2013 2121   [place] NEGATIVE 06/15/2013 2121   PROTEINUR 100 (A) 06/15/2013 2121   UROBILINOGEN 0.2 06/15/2013 2121   NITRITE NEGATIVE 06/15/2013 2121   LEUKOCYTESUR NEGATIVE 06/15/2013 2121   Sepsis Labs Invalid input(s): PROCALCITONIN,  WBC,  LACTICIDVEN Microbiology No results found  for this or any previous visit (from the past 240 hour(s)).   Time coordinating discharge: 40 minutes  SIGNED:   Rodena Goldmann, DO [facility] 05/12/2018, 5:05 PM Pager [phone]  If 7PM-7AM, please contact night-coverage [email or web address] Password [email or web address]

## 2018-05-12 NOTE — Progress Notes (Signed)
    Subjective: Feels better today. Hasn't eaten breakfast as he is NPO for HIDA. Feels less anxious.   Objective: Vital signs in last 24 hours: Temp:  [98 F (36.7 C)-98.3 F (36.8 C)] 98 F (36.7 C) (08/06 0600) Pulse Rate:  [86-97] 90 (08/06 0600) Resp:  [16-19] 19 (08/05 2219) BP: (137-152)/(87-95) 148/90 (08/06 0600) SpO2:  [99 %-100 %] 99 % (08/06 0600) Weight:  [149 lb 4 oz (67.7 kg)] 149 lb 4 oz (67.7 kg) (08/06 0600) Last BM Date: 05/10/18 General:   Alert and oriented, pleasant Head:  Normocephalic and atraumatic. Abdomen:  Bowel sounds present, soft, TTP epigastric without rebound or guarding Neurologic:  Alert and  oriented x 4 Psych:  Alert and cooperative. Normal mood and affect.  Intake/Output from previous day: 08/05 0701 - 08/06 0700 In: 360 [P.O.:360] Out: 1200 [Urine:1200] Intake/Output this shift: No intake/output data recorded.  Lab Results: Recent Labs    05/11/18 0622  WBC 8.1  HGB 9.3*  HCT 28.4*  PLT 328   BMET Recent Labs    05/10/18 0707 05/11/18 0622 05/12/18 0550  NA 140 140 141  141  K 4.0 3.8 4.4  4.4  CL 110 109 109  109  CO2 25 23 25  26   GLUCOSE 91 98 97  97  BUN 32* 29* 34*  33*  CREATININE 2.18* 1.81* 2.11*  2.09*  CALCIUM 8.8* 9.4 9.2  9.2   LFT Recent Labs    05/10/18 0707 05/11/18 0622 05/12/18 0550  ALBUMIN 3.1* 3.4* 3.1*   Hepatitis Panel Recent Labs    05/09/18 0954  HEPBSAG Negative   Assessment: 72 year old male admitted with atypical chest pain, acute on chronic renal failure, labile hypertension, clinically improving since admission. GI consult requested due to constellation of upper GI symptoms/dyspepsia. Gallbladder present, no stones on ultrasound this admission. HIDA scan today. Last EGD reportedly by Dr. Britta Mccreedy, which we are trying to retrieve.   Await HIDA scan. If normal, consider EGD with Propofol.   Anemia: chronic disease. Ferritin normal at 113.   Plan: Await HIDA  findings Possible EGD with Propofol this admission if HIDA negative PPI daily Elective initial screening colonoscopy as outpatient   Annitta Needs, PhD, ANP-BC [facility]    LOS: 1 day    05/12/2018, 8:18 AM

## 2018-05-12 NOTE — Progress Notes (Signed)
Subjective: Interval History: Patient is feeling better.  He does not have any nausea or vomiting.  Still he has some abdominal problem.  Complains of also some pain of his hand and his leg.  He denies any difficulty breathing.  Objective: Vital signs in last 24 hours: Temp:  [98 F (36.7 C)-98.3 F (36.8 C)] 98 F (36.7 C) (08/06 0600) Pulse Rate:  [86-97] 90 (08/06 0600) Resp:  [16-19] 19 (08/05 2219) BP: (137-152)/(87-95) 148/90 (08/06 0600) SpO2:  [99 %-100 %] 99 % (08/06 0600) Weight:  [67.7 kg (149 lb 4 oz)] 67.7 kg (149 lb 4 oz) (08/06 0600) Weight change: 0.6 kg (1 lb 5.2 oz)  Intake/Output from previous day: 08/05 0701 - 08/06 0700 In: 360 [P.O.:360] Out: 1200 [Urine:1200] Intake/Output this shift: No intake/output data recorded.  General appearance: alert, cooperative and no distress Resp: clear to auscultation bilaterally Cardio: regular rate and rhythm Extremities: No edema  Lab Results: Recent Labs    05/11/18 0622  WBC 8.1  HGB 9.3*  HCT 28.4*  PLT 328   BMET:  Recent Labs    05/11/18 0622 05/12/18 0550  NA 140 141  141  K 3.8 4.4  4.4  CL 109 109  109  CO2 23 25  26   GLUCOSE 98 97  97  BUN 29* 34*  33*  CREATININE 1.81* 2.11*  2.09*  CALCIUM 9.4 9.2  9.2   No results for input(s): PTH in the last 72 hours. Iron Studies:  Recent Labs    05/11/18 0622  IRON 62  TIBC 270  FERRITIN 113    Studies/Results: No results found.  I have reviewed the patient's current medications.  Assessment/Plan: 1] renal failure: Possibly acute on chronic.  His  creatinine has increased slightly possibly because of the addition of lisinopril.  Hormones stable. 2] bone and mineral disorder: His calcium and phosphorus is in range 3] proteinuria: None nephrotic range.  Complement is normal.  Hepatitis B surface antigen and hepatitis C antibody  remains pending..  4] hypertension: His blood pressure is much better. 5] history of CHF: Patient with  severe systolic dysfunction.  He has 1200 cc of urine output.  Denies any difficulty breathing. 6] anemia: Patient hemoglobin  is below our target goal.  His ferritin is 113 and iron saturation is 23%.  Most likely anemia of chronic disease.  His hemoglobin is stable. Plan: 1] we will  continue with present management 2] we will check renal panel in the morning. 3] I will follow him as an outpatient when he is discharged..   LOS: 1 day   Alizay Bronkema S 05/12/2018,8:46 AM

## 2018-05-12 NOTE — Telephone Encounter (Signed)
SCHEDULE EGD WITH POSSIBELDILATION W/ MAC AUG 22, DX: DYSPEPSIA/DYSPHAGIA

## 2018-05-12 NOTE — Progress Notes (Signed)
IV removed and discharge instructions reviewed.  Scripts sent to pharmacy

## 2018-05-12 NOTE — Progress Notes (Signed)
PROGRESS NOTE    Ronnie [name]  NWG:[id number] DOB: 06/01/1946 DOA: 05/07/2018 PCP: Wyatt Haste, NP   Brief Narrative:   Ronnie Jesus Hairstonis a 72 y.o.malewith medical history significant ofhyperlipidemia, hypertension, chronic combined systolic and diastolic heart failure who is referred to the emergency department by his PCP due to uncontrolled hypertension.He also has been having episodes of pressure like CP associated with dyspnea, lightheadedness and nausea.He has been seen by Cardiology with no ACS noted, but rather poorly controlled BP. He appears to also have AKI vs. Progression of CKD. He continues to have some ongoing nausea and poor appetite, butrenal function appears to be improving. His blood pressures continue to remain elevated and blood pressure agents have been added on today for better control.    Cardiology has signed off and nephrology plans to follow in the outpatient setting.  HIDA scan currently pending with possible plans for endoscopy in a.m.  Patient has been started on Xanax and Celexa with some improvement in anxiety levels noted.  He still continues to have symptoms of belching and anorexia.   Assessment & Plan:  Principal Problem: Elevated troponin Active Problems: Chest pain Chronic combined systolic and diastolic CHF (congestive heart failure) (HCC) Hyperlipidemia Essential hypertension, malignant Chronic kidney disease, stage III (moderate) (HCC) Anemia Malnutrition of moderate degree   1. Atypical chest pain with elevated troponin. Resolved with improvement in BP readings. [facility] evaluation. Likely related to demand ischemia with accelerated HTN. 2D echowith diminished EF noted.  Continue current blood pressure medications and cardiology has signed off. 2. AKI vs progression of CKD 3- stable. Pt has symptoms of uremia and likely progression to CKD4.  IV fluid to discontinue today and will monitor repeat  renal panel in a.m.  Iron studies pending.  Renal ultrasound with multiple cysts noted.  We will follow-up with nephrology in the outpatient setting once discharged.  3. Persistent vague GI symptoms with anorexia-persistent. Pt on PPI and H2 blocker with no signs of PUD. Also, RUQ U/S with no acute findings.   GI consult appreciated with HIDA scan plan for today and possible further endoscopy. 4. Labile hypertension-improved.Continue current medications with home lisinopril restarted at a dose of 20 mg at this time. Coreg dose doubled to 12.5 mg twice daily and hydralazine p.o. added by cardiology. 5. Anxiety/depression.  Started on Celexa 10 mg daily along with Xanax twice daily for now.  Hopefully this will also improve his appetite. 6. Anemia-stable. Stable and likely chronic. 7. Dyslipidemia. Continue simvastatin daily.   DVT prophylaxis:Heparin Code Status:Full Family Communication:None at bedside Disposition Plan:Evaluation ongoing for dysphagia and anorexia with HIDA scan and possible endoscopy in a.m.  Likely discharge tomorrow after work-up is completed.   Consultants:  Cardiology  Nephrology  GI  Procedures:  2D echocardiogramwith LVEF 25-30%; on 05/08/2018  Antimicrobials:   None   Subjective: Patient seen and evaluated today with no new acute complaints or concerns. No acute concerns or events noted overnight.  His blood pressure control has improved and he is much less anxious after restarting Xanax and Celexa.  He continues to have vague GI symptoms which is still being evaluated.  Objective: Vitals:   05/11/18 1535 05/11/18 1808 05/11/18 2219 05/12/18 0600  BP: 137/87 (!) 151/88 (!) 152/95 (!) 148/90  Pulse: 86 92 97 90  Resp: 16  19   Temp: 98 F (36.7 C)  98.3 F (36.8 C) 98 F (36.7 C)  TempSrc: Oral  Oral Oral  SpO2: 100%  100% 99%  Weight:    67.7 kg (149 lb 4 oz)  Height:        Intake/Output Summary (Last 24 hours) at 05/12/2018  1232 Last data filed at 05/12/2018 0900 Gross per 24 hour  Intake 360 ml  Output 600 ml  Net -240 ml   Filed Weights   05/10/18 0500 05/11/18 0500 05/12/18 0600  Weight: 66.4 kg (146 lb 6.4 oz) 67.1 kg (147 lb 14.9 oz) 67.7 kg (149 lb 4 oz)    Examination:  General exam: Appears calm and comfortable  Respiratory system: Clear to auscultation. Respiratory effort normal. Cardiovascular system: S1 & S2 heard, RRR. No JVD, murmurs, rubs, gallops or clicks. No pedal edema. Gastrointestinal system: Abdomen is nondistended, soft and nontender. No organomegaly or masses felt. Normal bowel sounds heard. Central nervous system: Alert and oriented. No focal neurological deficits. Extremities: Symmetric 5 x 5 power. Skin: No rashes, lesions or ulcers Psychiatry: Less anxious today.    Data Reviewed: I have personally reviewed following labs and imaging studies  CBC: Recent Labs  Lab 05/07/18 1800 05/08/18 0310 05/09/18 0548 05/11/18 0622  WBC 7.1 7.1 6.0 8.1  NEUTROABS 4.5  --   --   --   HGB 9.5* 9.0* 9.5* 9.3*  HCT 29.5* 27.8* 29.7* 28.4*  MCV 90.8 90.3 90.8 90.4  PLT 344 286 322 643   Basic Metabolic Panel: Recent Labs  Lab 05/08/18 0310 05/08/18 1517 05/09/18 0548 05/10/18 0707 05/11/18 0622 05/12/18 0550  NA 134*  --  139 140 140 141  141  K 4.3  --  4.1 4.0 3.8 4.4  4.4  CL 101  --  106 110 109 109  109  CO2 25  --  26 25 23 25  26   GLUCOSE 161*  --  105* 91 98 97  97  BUN 38*  --  35* 32* 29* 34*  33*  CREATININE 2.28*  --  2.37* 2.18* 1.81* 2.11*  2.09*  CALCIUM 8.8*  --  8.6* 8.8* 9.4 9.2  9.2  PHOS  --  4.3  --  3.4 2.2* 3.2   GFR: Estimated Creatinine Clearance: 30.3 mL/min (A) (by C-G formula based on SCr of 2.11 mg/dL (H)). Liver Function Tests: Recent Labs  Lab 05/07/18 1800 05/09/18 0548 05/10/18 0707 05/11/18 0622 05/12/18 0550  AST 35 29  --   --   --   ALT 14 12  --   --   --   ALKPHOS 85 78  --   --   --   BILITOT 0.4 0.5  --   --    --   PROT 7.8 7.2  --   --   --   ALBUMIN 3.7 3.2* 3.1* 3.4* 3.1*   Recent Labs  Lab 05/07/18 1800 05/08/18 0925  LIPASE 62* 55*   No results for input(s): AMMONIA in the last 168 hours. Coagulation Profile: No results for input(s): INR, PROTIME in the last 168 hours. Cardiac Enzymes: Recent Labs  Lab 05/07/18 1800 05/07/18 2109 05/08/18 0310 05/08/18 0925 05/08/18 1517  TROPONINI 0.10* 0.10* 0.09* 0.10* 0.09*   BNP (last 3 results) No results for input(s): PROBNP in the last 8760 hours. HbA1C: No results for input(s): HGBA1C in the last 72 hours. CBG: No results for input(s): GLUCAP in the last 168 hours. Lipid Profile: No results for input(s): CHOL, HDL, LDLCALC, TRIG, CHOLHDL, LDLDIRECT in the last 72 hours. Thyroid Function Tests: No results for input(s): TSH, T4TOTAL, FREET4,  T3FREE, THYROIDAB in the last 72 hours. Anemia Panel: Recent Labs    05/11/18 0622  FERRITIN 113  TIBC 270  IRON 62   Sepsis Labs: No results for input(s): PROCALCITON, LATICACIDVEN in the last 168 hours.  No results found for this or any previous visit (from the past 240 hour(s)).       Radiology Studies: No results found.      Scheduled Meds: . ALPRAZolam  0.25 mg Oral BID  . aspirin EC  81 mg Oral Daily  . carvedilol  12.5 mg Oral BID WC  . citalopram  10 mg Oral Daily  . famotidine  20 mg Oral QHS  . feeding supplement (ENSURE ENLIVE)  237 mL Oral BID BM  . heparin injection (subcutaneous)  5,000 Units Subcutaneous Q8H  . hydrALAZINE  25 mg Oral Q8H  . isosorbide mononitrate  30 mg Oral Daily  . lisinopril  20 mg Oral Daily  . ondansetron  4 mg Oral TID AC  . pantoprazole  40 mg Oral Daily  . simvastatin  40 mg Oral QPM   Continuous Infusions:   LOS: 1 day    Time spent: 30 minutes    Nabil Bubolz Darleen Crocker, DO [facility] Pager [phone]  If 7PM-7AM, please contact night-coverage [email or web address] Password [facility] 05/12/2018, 12:32 PM

## 2018-05-13 ENCOUNTER — Other Ambulatory Visit: Payer: Self-pay | Admitting: *Deleted

## 2018-05-13 DIAGNOSIS — R1310 Dysphagia, unspecified: Secondary | ICD-10-CM

## 2018-05-13 DIAGNOSIS — R1013 Epigastric pain: Secondary | ICD-10-CM

## 2018-05-13 SURGERY — ESOPHAGOGASTRODUODENOSCOPY (EGD) WITH PROPOFOL
Anesthesia: Monitor Anesthesia Care

## 2018-05-13 NOTE — Telephone Encounter (Signed)
Kim from endo called. They will do a pre-op phone call with patient. Nothing further needed

## 2018-05-13 NOTE — Telephone Encounter (Signed)
Spoke with patient. Aware EGD/+/-dil with propofol scheduled for 05/28/18 at 7:30am. I have mailed his instructions to him. Called Kim in Endo regarding pre-op appointment. She is going to check with pre-op nurse and let me know.

## 2018-05-13 NOTE — Telephone Encounter (Signed)
Per kim in endo can add on 7:30am that day. Okay per SLF.  Called patient and LMOVM to get him scheduled.

## 2018-05-14 ENCOUNTER — Other Ambulatory Visit: Payer: Self-pay

## 2018-05-14 ENCOUNTER — Telehealth: Payer: Self-pay | Admitting: *Deleted

## 2018-05-14 ENCOUNTER — Ambulatory Visit (INDEPENDENT_AMBULATORY_CARE_PROVIDER_SITE_OTHER): Payer: Medicare Other

## 2018-05-14 ENCOUNTER — Telehealth: Payer: Self-pay | Admitting: Gastroenterology

## 2018-05-14 DIAGNOSIS — I5022 Chronic systolic (congestive) heart failure: Secondary | ICD-10-CM | POA: Diagnosis not present

## 2018-05-14 NOTE — Telephone Encounter (Signed)
Patient called and wanted me to inform his spouse of date/time for his procedure. Spouse is aware.

## 2018-05-14 NOTE — Telephone Encounter (Signed)
[facility] nurse will be starting home health tomorrow with patient

## 2018-05-14 NOTE — Telephone Encounter (Signed)
Pt said he was returning a call. It was difficult for me to hear or understand what he was saying. I seen where MS spoke to him yesterday about getting him scheduled at the hospital and I told him that we had a referral from Rosealee Albee, NP and he was scheduled on OV in Sept.. Pt said someone called him about going to the hospital at 0730. I told him MS was on another call and I would ask her. [phone]

## 2018-05-18 ENCOUNTER — Other Ambulatory Visit: Payer: Self-pay | Admitting: *Deleted

## 2018-05-18 ENCOUNTER — Telehealth: Payer: Self-pay | Admitting: Cardiology

## 2018-05-18 NOTE — Patient Outreach (Addendum)
Ronnie [name]) Care Management  05/18/2018  Ronnie [name] 1946-07-05 [id number]   EMMI-       RED ON EMMI ALERT Day # 1 Date: 05/15/18 Red Alert Reason: Got discharge papers? I don't know   Outreach attempt #1 successful at his [name] number Patient is able to verify HIPAA Ronnie [name] Care Management RN reviewed and addressed red alert with patient  Ronnie [name] stated he was "half way sleep when I got the call." he voiced concern about various calls from various people/services He confirms he does have his discharge instructions and reviewed them with the Ronnie [name] staff recently  He confirms he is followed by Ronnie [name] Ronnie [name] and Ronnie [name] PT who have visited this week  He reports being confused a lot (?anemia), poor appetite "depressed" and gets dizzy He denies falls and has a cane and walker for ambulation. During this contact call he was noted to be out of breath with walking. He was encouraged to take a nebulizer tx after the call and rest   He was recently d/c [name] after dx of CHF his last wt was 149 lbs He does have a united [name] care scale that he reports has small print and he needs a larger print. 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  He reports social concerns with the wife's family.  He reports having children but not local to assist in his care.  (son in [place] in "poor [name]" and one in [place] who may be moving)  He had a 25 yr old grandson who was staying with him until school restarted.  He reports the wife assists with transportation and food at intervals  Conditions: CHF, CKD stage 3, HTN, HDL, anemia,depression (reports "I cried in front of Ronnie [name] at the [name]"), malnutrition of moderate degree, dyspepsia, chest pain  Appointments He does recall he has an upcoming appointment with GI Ronnie [name]  to check on dysphagia  Reports changing MDs from Ronnie [name] to Ronnie [name] Sharol [name] is his wife's MD)   Medications He does not know the names of his medications and reports the [name] found some he was taking too many of.  He reported taking 2 types of antidepressants.  Epic indicates the correction of these by staff on 05/18/18    Advised patient that there will be further automated EMMI- post discharge calls to assess how the patient is doing following the recent hospitalization Advised the patient that another call may be received from a nurse if any of their responses were abnormal. Patient voiced understanding and was appreciative of f/u call.  Plan: [name] RN CM allowed Ronnie [name] time to ventilate his feelings [facility] RN CM will refer Ronnie [name] to Ronnie [name] [name] and Ronnie Lavina Hamman, RN, BSN, Ronnie [name] Direct Number [phone] Mobile number [phone]  Main [facility] number [phone] Fax number [phone]

## 2018-05-18 NOTE — Telephone Encounter (Signed)
Can they reach out to his pcp, they are much more familiar and better to navigate any possible home assistance the patient may need   J Christen Bedoya MD

## 2018-05-18 NOTE — Telephone Encounter (Signed)
Physical therapy came to work with patient and vitals are high BP 176/112 HR 100. Patient did not take any medication this morning. Patient did take medication while PT was there. Home health/PT would like to know if Dr. Harl Bowie would order for social worker to come to home due to patient not having much support there. Patient states he is having lots of night sweats and is concerned about that.

## 2018-05-18 NOTE — Telephone Encounter (Signed)
Vitals signs are high  176/112  Pulse  100

## 2018-05-18 NOTE — Telephone Encounter (Signed)
Advised them of this.

## 2018-05-19 ENCOUNTER — Other Ambulatory Visit: Payer: Self-pay | Admitting: *Deleted

## 2018-05-19 NOTE — Patient Outreach (Signed)
[facility]) Care Management  05/19/2018  Ronnie [name] 1946/10/06 [id number]   EMMI-       general discharge                                                  RED ON EMMI ALERT Day #4 Date: 05/18/18 Red Alert Reason: other questions/problems? Yes Lost interest in things?Yes Sad/hopeless/anxious/empty?  Outreach attempt #1 successful at his home number Patient is able to verify HIPAA [place] Management RN reviewed and addressed red alertwith patient  Mr Bertino stated he does not like the EMMI calls Cm discussed that for General d/c he had received the last call.  He confirms he does have little interest in things and feels sad at times. Cm updated him on [facility] referral to [place] care coordinator and SW   He confirms he was seen today by Levada Dy of [facility] at home who discussed CHF, wts, BP and medicines with him. Levada Dy reconciled his medicines but he reports having medicines that [facility]  CM discussed with him the importance [facility] update [facility] that he no longer needs the medicine. He reports he will call after speaking with CM   He states he is less confused today and is noted to be able to follow the content of the conversation today.    He inquired about CHF wt changes and voiced concern about gaining 2 lbs "if I eat" Cm discussed the increase in weight for 3-5 lbs should be reported if it is maintained over 24 hours He voiced understanding. CM discussed that [facility] RN CM coordinator will review this again.  He continues to use the united health care scale   Conditions: CHF, CKD stage 3, HTN, HDL, anemia,depression (reports "I cried in front of [name] at the hospital"), malnutrition of moderate degree, dyspepsia, chest pain  Appointments He does recall he has an upcoming appointment with GI [name] to check on dysphagia  Reports changing MDs from [name] to [name] Sharol Roussel is his wife's MD)    Advised  patient that there will be further automated EMMI-post discharge calls to assess how the patient is doing following the recent hospitalization Advised the patient that another call may be received from a nurse if any of their responses were abnormal. Patient voiced understanding and was appreciative of f/u call.  Plan: [facility] RN CM discussed case closure with Mr Bedel for [facility] telephonic CM  [facility] RN CM will close case at this time as patient has been assessed and no needs identified.    Thatiana Renbarger L. Lavina Hamman, RN, BSN, [place] Management Care Coordinator Direct Number [phone] Mobile number [phone]  Main [facility] number [phone] Fax number [phone]

## 2018-05-20 ENCOUNTER — Encounter: Payer: Self-pay | Admitting: Licensed Clinical Social Worker

## 2018-05-20 ENCOUNTER — Other Ambulatory Visit: Payer: Self-pay | Admitting: Licensed Clinical Social Worker

## 2018-05-20 ENCOUNTER — Other Ambulatory Visit: Payer: Self-pay | Admitting: *Deleted

## 2018-05-20 NOTE — Patient Outreach (Signed)
RN CM received referral from telephonic RN, telephone call to pt to schedule initial home visit, spoke with pt, HIPAA verified, initial home visit scheduled for next week.  PLAN See pt for initial home visit next week.  [name] [facility], [place] Coordinator [phone]

## 2018-05-20 NOTE — Patient Outreach (Signed)
Assessment:  CSW received referral on Ronnie [name]. CSW completed chart review on client on 05/20/18.  Client has been also referred to [name] for [facility] nursing support.  Client lives alone at his home in [place], [place].  Client has a medical alert necklace he wears for emergency needs of client. Client's wife also assists client occasionaly with transport needs of client and with food needs of client.  Client had reported that his wife lives in another home at present.  CSW spoke via phone with client on 05/20/18. CSW verified client identity. CSW received verbal permission from client  on 05/20/18 for CSW to speak with client or with Ronnie [name] (spouse of client) regarding client needs and status. Client reported that he has congestive heart failure and sometimes becomes short of breath in talking or in walking short distances. He said he uses nebulizer as prescribed. He said he uses inhaler as prescribed. CSW spoke with client about [facility] program services in nursing, social work and pharmacy.  CSW and client completed needed [facility] assessments for client. Client has some support from his spouse, Ronnie [name]. Client has occasional falls and has some challenges with mobility. He uses a cane or walker to assist in ambulation. CSW talked with Ronnie [name] about client care plan.  CSW encouraged that Ronnie [name] communicate with CSW in next 30 days to discuss transportation resources for client in the area. Client agreed to this care plan for client.  Client said that Ronnie [name] plans to conduct home visit with client next week .  Client has prescribed medications and takes medications as prescribed. Client sees Ronnie [name], Nurse Practitioner, as medical provider. CSW thanked client for phone call with CSW and encouraged client to call CSW at [phone] as needed to discuss social work needs of client.   Plan:  Client to talk with CSW in next 30 days to discuss transportation resources for client in the  community.  CSW to collaborate with Ronnie [name] in monitoring needs of client.  CSW to call client in 3 weeks to assess client needs at that time.  Norva Riffle.Ronnie [name] MSW, LCSW Licensed Clinical Social Worker [facility] Care Management [phone]

## 2018-05-21 NOTE — Telephone Encounter (Signed)
Error

## 2018-05-22 ENCOUNTER — Encounter (HOSPITAL_COMMUNITY)
Admission: RE | Admit: 2018-05-22 | Discharge: 2018-05-22 | Disposition: A | Discharge status: Home / Self Care | Payer: Medicare Other | Source: Ambulatory Visit | Attending: Gastroenterology | Admitting: Gastroenterology

## 2018-05-22 ENCOUNTER — Telehealth: Payer: Self-pay | Admitting: *Deleted

## 2018-05-22 ENCOUNTER — Encounter (HOSPITAL_COMMUNITY): Payer: Self-pay

## 2018-05-22 NOTE — Telephone Encounter (Signed)
Pt aware and voiced understanding - routed to pcp  

## 2018-05-22 NOTE — Telephone Encounter (Signed)
-----   Message from Arnoldo Lenis, MD sent at 05/18/2018  1:52 PM EDT ----- Echo shows heart function remains decrased. We will discuss at our next follow up. He is already on some good medicines to help strengthen it over time  J BrancH MD

## 2018-05-22 NOTE — Progress Notes (Signed)
Patient very unsure about most of his medications that were listed on his med list.  Gave him instructions of which medications to take if he was able to locate them.  In addition, seemed skeptical as to if he was going to be able to make it to his procedure.  Gave him [phone] to call to cancel within 24 hours of procedure if he decided that he was not going to follow through.  Verbalized understanding.

## 2018-05-27 ENCOUNTER — Other Ambulatory Visit: Payer: Self-pay | Admitting: *Deleted

## 2018-05-27 ENCOUNTER — Encounter: Payer: Self-pay | Admitting: *Deleted

## 2018-05-27 ENCOUNTER — Telehealth: Payer: Self-pay | Admitting: Gastroenterology

## 2018-05-27 NOTE — Telephone Encounter (Signed)
Tried to call Melanie and many rings and no answer.

## 2018-05-27 NOTE — Telephone Encounter (Signed)
Gina from Short Stay called to say that patient's creatine was 2.11 and he is the first case for SF in the morning.

## 2018-05-27 NOTE — Telephone Encounter (Signed)
PT HAS CHRONIC RENAL INSUFFICIENCY.

## 2018-05-27 NOTE — Telephone Encounter (Signed)
Routing to Doris 

## 2018-05-27 NOTE — Patient Outreach (Signed)
[facility]) Care Management   05/27/2018  Ronnie [name] 16-Sep-1946 [id number]  Ronnie [name] is an 72 y.o. male  Subjective: Initial home visit with pt, HIPAA verified, pt reports his breathing and endurance is hindrance for him and would like to see improvement with this, pt states he has dizziness at times and uses walker and does not ambulate if dizzy.  Pt states he lives alone and his wife does not live with him.    Objective:   Vitals:   05/27/18 1107  BP: 116/68  Pulse: 78  Resp: 18  SpO2: 98%  Weight: 141 lb 12.8 oz (64.3 kg)   ROS  Physical Exam  Constitutional: He is oriented to person, place, and time. He appears well-developed.  HENT:  Head: Normocephalic.  Neck: Normal range of motion.  Cardiovascular: Normal rate.  Respiratory: Effort normal and breath sounds normal.  Dyspnea with exertion  GI: Soft. Bowel sounds are normal.  Musculoskeletal: Normal range of motion. He exhibits no edema.  Neurological: He is alert and oriented to person, place, and time.  Skin: Skin is warm and dry.  Psychiatric: He has a normal mood and affect. His behavior is normal. Judgment and thought content normal.    Encounter Medications:   Outpatient Encounter Medications as of 05/27/2018  Medication Sig  . albuterol (PROVENTIL HFA;VENTOLIN HFA) 108 (90 BASE) MCG/ACT inhaler Inhale 2 puffs into the lungs 2 (two) times daily as needed for wheezing or shortness of breath.  Marland Kitchen aspirin EC 81 MG EC tablet Take 1 tablet (81 mg total) by mouth daily.  . carvedilol (COREG) 12.5 MG tablet Take 1 tablet (12.5 mg total) by mouth 2 (two) times daily with a meal.  . citalopram (CELEXA) 10 MG tablet Take 1 tablet (10 mg total) by mouth daily.  Marland Kitchen docusate sodium (COLACE) 100 MG capsule Take 100 mg by mouth daily as needed for mild constipation.  . fluticasone (FLONASE) 50 MCG/ACT nasal spray Place 1 spray into both nostrils daily as needed for allergies or rhinitis.  .  furosemide (LASIX) 20 MG tablet TAKE 1 TABLET AS NEEDED FOR SWELLING (Patient taking differently: Take 20-40 mg by mouth daily as needed for edema. )  . hydrALAZINE (APRESOLINE) 25 MG tablet Take 1 tablet (25 mg total) by mouth every 8 (eight) hours. (Patient taking differently: Take 25 mg by mouth 2 (two) times daily. )  . ipratropium-albuterol (DUONEB) 0.5-2.5 (3) MG/3ML SOLN Take 3 mLs by nebulization every 6 (six) hours as needed (shortness of breath).  . isosorbide mononitrate (IMDUR) 30 MG 24 hr tablet Take 30 mg by mouth daily.   Marland Kitchen lisinopril (PRINIVIL,ZESTRIL) 20 MG tablet Take 1 tablet (20 mg total) by mouth daily.  . meclizine (ANTIVERT) 25 MG tablet Take 25 mg by mouth 3 (three) times daily as needed for dizziness.  . Multiple Vitamin (MULTIVITAMIN WITH MINERALS) TABS tablet Take 1 tablet by mouth daily.  . pantoprazole (PROTONIX) 40 MG tablet Take 40 mg by mouth daily.  . simvastatin (ZOCOR) 20 MG tablet Take 40 mg by mouth every evening.  . escitalopram (LEXAPRO) 20 MG tablet Take 20 mg by mouth daily.  Marland Kitchen lisinopril (PRINIVIL,ZESTRIL) 10 MG tablet Take 10 mg by mouth daily.  . ondansetron (ZOFRAN) 4 MG tablet Take 1 tablet (4 mg total) by mouth every 6 (six) hours as needed for nausea. (Patient not taking: Reported on 05/27/2018)  . ranitidine (ZANTAC) 150 MG tablet Take 150 mg by mouth at bedtime.   Marland Kitchen  simvastatin (ZOCOR) 40 MG tablet Take 1 tablet (40 mg total) by mouth every evening. (Patient not taking: Reported on 05/27/2018)   No facility-administered encounter medications on file as of 05/27/2018.     Functional Status:   In your present state of health, do you have any difficulty performing the following activities: 05/20/2018 05/07/2018  Hearing? N -  Vision? N -  Difficulty concentrating or making decisions? N -  Walking or climbing stairs? N -  Dressing or bathing? N -  Doing errands, shopping? Tempie Donning  Preparing Food and eating ? N -  Using the Toilet? N -  In the past six  months, have you accidently leaked urine? N -  Do you have problems with loss of bowel control? N -  Managing your Medications? N -  Managing your Finances? N -  Housekeeping or managing your Housekeeping? Y -  Some recent data might be hidden    Fall/Depression Screening:    Fall Risk  05/27/2018 05/20/2018  Falls in the past year? Yes Yes  Number falls in past yr: 2 or more 2 or more  Injury with Fall? No No  Risk Factor Category  High Fall Risk High Fall Risk  Risk for fall due to : History of fall(s);Impaired balance/gait;Medication side effect Impaired balance/gait;Impaired mobility;Medication side effect  Follow up Falls prevention discussed;Falls evaluation completed Falls prevention discussed   PHQ 2/9 Scores 05/27/2018 05/20/2018 05/18/2018  PHQ - 2 Score 3 2 2   PHQ- 9 Score 8 7 -    Assessment:  RN CM observed medications and reviewed with pt, medications are in prefilled med box supplied by [facility], pt is wearing life alert necklace and using walker during visit, RN CM discussed depression and pt is taking medication and states going to outpatient therapy/ counseling would not work well for him, pt feels depression is related to family issues/ dynamics,  RN CM sent in basket to [facility] (already involved with patient) to request counseling in home provided by CS[facility].  RN CM faxed initial home visit and barrier letter to primary care Rosealee Albee NP.  [facility]N CM Care Plan Problem One     Most Recent Value  Care Plan Problem One  Knowledge deficit related to CHF  Role Documenting the Problem One  Care Management Coordinator  Care Plan for Problem One  Active  THN Long Term Goal   pt will verbalize/ demonstrate improved self care for CHF within 60 days  THN Long Term Goal Start Date  05/27/18  Interventions for Problem One Long Term Goal  RN CM gave [employer] calendar and reviewed with pt, pt is already weighing daily and logging in calendar provided by home health  THN  CM Short Term Goal #1   pt will verbalize CHF zones/ action plan within 30 days  [facility]N CM Short Term Goal #1 Start Date  05/27/18  Interventions for Short Term Goal #1  RN CM gave pt copy of CHF action plan/ zones and reviewed with pt with empahsis on yellow zone, pt states he will call cardiologist for any issues    [facility] CM Care Plan Problem Two     Most Recent Value  Care Plan Problem Two  Pt has decreased endurance and fall risk related to vertigo  Role Documenting the Problem Two  Care Management [place] for Problem Two  Active  THN CM Short Term Goal #1   Pt will verbalize/ demonstrate safety precautions  within 30 days  THN CM Short Term Goal #1 Start Date  05/27/18  Interventions for Short Term Goal #2   Rn CM collaborated with home health RN from [facility] present during visit, ask if vestibular therapy may be appropriate for pt , PT is already seeing pt, Levada Dy states she will check into this with PT and obtain order if appropriate therapy, RN CM observed bathroom area, pt has bars around toilet and hand held shower head, reports has small seat to sit on, RN CM reviewed importance of using walker, not getting in a hurry and asking for assistance as needed.    THN CM Care Plan Problem Three     Most Recent Value  Care Plan Problem Three  Pt high nutritional risk  Role Documenting the Problem Three  Care Management Coordinator  Care Plan for Problem Three  Active  THN CM Short Term Goal #1   Pt will verbalize interventions to promote better nutrition within 30 days  THN CM Short Term Goal #1 Start Date  05/27/18  Interventions for Short Term Goal #1  RN CM encouraged pt to continue drinking nutritional supplement BID, importance of eating 3 meals plus snacks with adequate protein and carbohydrates      Plan: see pt for home visit next month  [name] [facility], [place] [phone]

## 2018-05-28 ENCOUNTER — Encounter (HOSPITAL_COMMUNITY)
Admission: RE | Disposition: A | Discharge status: Home / Self Care | Payer: Self-pay | Source: Ambulatory Visit | Attending: Gastroenterology

## 2018-05-28 ENCOUNTER — Telehealth: Payer: Self-pay | Admitting: Gastroenterology

## 2018-05-28 ENCOUNTER — Ambulatory Visit (HOSPITAL_COMMUNITY): Payer: Medicare Other | Admitting: Anesthesiology

## 2018-05-28 ENCOUNTER — Ambulatory Visit (HOSPITAL_COMMUNITY)
Admission: RE | Admit: 2018-05-28 | Discharge: 2018-05-28 | Disposition: A | Discharge status: Home / Self Care | Payer: Medicare Other | Source: Ambulatory Visit | Attending: Gastroenterology | Admitting: Gastroenterology

## 2018-05-28 ENCOUNTER — Encounter (HOSPITAL_COMMUNITY): Payer: Self-pay | Admitting: *Deleted

## 2018-05-28 ENCOUNTER — Other Ambulatory Visit: Payer: Self-pay

## 2018-05-28 DIAGNOSIS — Z87891 Personal history of nicotine dependence: Secondary | ICD-10-CM | POA: Insufficient documentation

## 2018-05-28 DIAGNOSIS — I110 Hypertensive heart disease with heart failure: Secondary | ICD-10-CM | POA: Diagnosis not present

## 2018-05-28 DIAGNOSIS — K2970 Gastritis, unspecified, without bleeding: Secondary | ICD-10-CM

## 2018-05-28 DIAGNOSIS — K2950 Unspecified chronic gastritis without bleeding: Secondary | ICD-10-CM | POA: Insufficient documentation

## 2018-05-28 DIAGNOSIS — R1084 Generalized abdominal pain: Secondary | ICD-10-CM

## 2018-05-28 DIAGNOSIS — R1013 Epigastric pain: Secondary | ICD-10-CM | POA: Diagnosis not present

## 2018-05-28 DIAGNOSIS — R634 Abnormal weight loss: Secondary | ICD-10-CM | POA: Diagnosis not present

## 2018-05-28 DIAGNOSIS — Z79899 Other long term (current) drug therapy: Secondary | ICD-10-CM | POA: Insufficient documentation

## 2018-05-28 DIAGNOSIS — R1310 Dysphagia, unspecified: Secondary | ICD-10-CM | POA: Diagnosis present

## 2018-05-28 DIAGNOSIS — Z7951 Long term (current) use of inhaled steroids: Secondary | ICD-10-CM | POA: Diagnosis not present

## 2018-05-28 DIAGNOSIS — E785 Hyperlipidemia, unspecified: Secondary | ICD-10-CM | POA: Diagnosis not present

## 2018-05-28 DIAGNOSIS — Z681 Body mass index (BMI) 19 or less, adult: Secondary | ICD-10-CM | POA: Insufficient documentation

## 2018-05-28 DIAGNOSIS — I5042 Chronic combined systolic (congestive) and diastolic (congestive) heart failure: Secondary | ICD-10-CM | POA: Insufficient documentation

## 2018-05-28 DIAGNOSIS — Z7982 Long term (current) use of aspirin: Secondary | ICD-10-CM | POA: Diagnosis not present

## 2018-05-28 HISTORY — PX: BIOPSY: SHX5522

## 2018-05-28 HISTORY — PX: ESOPHAGOGASTRODUODENOSCOPY (EGD) WITH PROPOFOL: SHX5813

## 2018-05-28 HISTORY — PX: SAVORY DILATION: SHX5439

## 2018-05-28 SURGERY — ESOPHAGOGASTRODUODENOSCOPY (EGD) WITH PROPOFOL
Anesthesia: Monitor Anesthesia Care

## 2018-05-28 MED ORDER — CHLORHEXIDINE GLUCONATE CLOTH 2 % EX PADS: 6.0000 | MEDICATED_PAD | Freq: Once | CUTANEOUS | Status: DC

## 2018-05-28 MED ORDER — PROPOFOL 10 MG/ML IV BOLUS
INTRAVENOUS | Status: DC | PRN
  Administered 2018-05-28: 20 mg via INTRAVENOUS

## 2018-05-28 MED ORDER — PROPOFOL 500 MG/50ML IV EMUL
INTRAVENOUS | Status: DC | PRN
  Administered 2018-05-28: 150 ug/kg/min via INTRAVENOUS

## 2018-05-28 MED ORDER — LIDOCAINE HCL 1 % IJ SOLN
INTRAMUSCULAR | Status: DC | PRN
  Administered 2018-05-28: 30 mg via INTRADERMAL

## 2018-05-28 MED ORDER — HYDROCODONE-ACETAMINOPHEN 7.5-325 MG PO TABS: 1.0000 | ORAL_TABLET | Freq: Once | ORAL | Status: DC | PRN

## 2018-05-28 MED ORDER — LACTATED RINGERS IV SOLN
INTRAVENOUS | Status: DC
  Administered 2018-05-28: 1000 mL via INTRAVENOUS

## 2018-05-28 MED ORDER — LACTATED RINGERS IV SOLN: INTRAVENOUS | Status: DC

## 2018-05-28 MED ORDER — MEPERIDINE HCL 100 MG/ML IJ SOLN: 6.2500 mg | INTRAMUSCULAR | Status: DC | PRN

## 2018-05-28 MED ORDER — HYDROMORPHONE HCL 1 MG/ML IJ SOLN: 0.2500 mg | INTRAMUSCULAR | Status: DC | PRN

## 2018-05-28 MED ORDER — PROMETHAZINE HCL 25 MG/ML IJ SOLN: 6.2500 mg | INTRAMUSCULAR | Status: DC | PRN

## 2018-05-28 MED ORDER — PROPOFOL 10 MG/ML IV BOLUS
INTRAVENOUS | Status: AC
  Filled 2018-05-28: qty 40

## 2018-05-28 NOTE — Telephone Encounter (Signed)
Pt is having procedure this Am.

## 2018-05-28 NOTE — Op Note (Signed)
[facility] Patient Name: Ronnie [name] Procedure Date: 05/28/2018 7:31 AM MRN: [id number] Date of Birth: 1946/10/05 Attending MD: Barney Drain MD, MD CSN: [id number] Age: 72 Admit Type: Outpatient Procedure:                Upper GI endoscopy WITH ESOPHAGEAL DILATION/COLD                            FORCEPS BIOPSY Indications:              Dyspepsia, Dysphagia, WEIGHT LOSS: DOWN 7 LBS SINCE                            JUL 1660(600 LBS) TO (141 LBS)AUG 22) Providers:                Barney Drain MD, MD, Janeece Riggers, RN, Nelma Rothman,                            Technician Referring MD:             Wyatt Haste Medicines:                Propofol per Anesthesia Complications:            No immediate complications. Estimated Blood Loss:     Estimated blood loss was minimal. Procedure:                Pre-Anesthesia Assessment:                           - Prior to the procedure, a History and Physical                            was performed, and patient medications and                            allergies were reviewed. The patient's tolerance of                            previous anesthesia was also reviewed. The risks                            and benefits of the procedure and the sedation                            options and risks were discussed with the patient.                            All questions were answered, and informed consent                            was obtained. Prior Anticoagulants: The patient has                            taken aspirin, last dose was 1 day prior to  procedure. ASA Grade Assessment: II - A patient                            with mild systemic disease. After reviewing the                            risks and benefits, the patient was deemed in                            satisfactory condition to undergo the procedure.                            After obtaining informed consent, the endoscope was                             passed under direct vision. Throughout the                            procedure, the patient's blood pressure, pulse, and                            oxygen saturations were monitored continuously. The                            GIF-H190 ([id number]) scope was introduced through the                            mouth, and advanced to the second part of duodenum.                            The upper GI endoscopy was accomplished without                            difficulty. The patient tolerated the procedure                            well. Scope In: 7:45:09 AM Scope Out: 7:57:26 AM Total Procedure Duration: 0 hours 12 minutes 17 seconds  Findings:      No endoscopic abnormality was evident in the esophagus to explain the       patient's complaint of dysphagia. It was decided, however, to proceed       with dilation of the entire esophagus DUE TO POSSIBLE ESOPHAGEAL WEB. A       guidewire was placed and the scope was withdrawn. Dilation was performed       with a Savary dilator with mild resistance at 16 mm and 17 mm. Estimated       blood loss: none.      Patchy mild inflammation characterized by congestion (edema) and       erythema was found in the gastric antrum. Biopsies(3: ANTRUM, 2:       BODY)were taken with a cold forceps for Helicobacter pylori testing.      The examined duodenum was normal. Biopsies(2: BULB, 4: 2ND PORTION) for       histology were taken with a cold forceps for  evaluation of celiac       disease. Impression:               - No endoscopic esophageal abnormality to explain                            patient's dysphagia. Esophagus dilated. Dilated.                           - MILD Gastritis. Biopsied. Moderate Sedation:      Per Anesthesia Care Recommendation:           - Patient has a contact number available for                            emergencies. The signs and symptoms of potential                            delayed complications were discussed with the                             patient. Return to normal activities tomorrow.                            Written discharge instructions were provided to the                            patient.                           - Resume previous diet.                           - Continue present medications.                           - Await pathology results.                           - Return to my office in 3 months.                           - NEEDS EVALUATION OF MESENTERIC VESSELS. Procedure Code(s):        --- Professional ---                           [phone], Esophagogastroduodenoscopy, flexible,                            transoral; with insertion of guide wire followed by                            passage of dilator(s) through esophagus over guide                            wire  83338, Esophagogastroduodenoscopy, flexible,                            transoral; with biopsy, single or multiple Diagnosis Code(s):        --- Professional ---                           R13.10, Dysphagia, unspecified                           K29.70, Gastritis, unspecified, without bleeding                           R10.13, Epigastric pain CPT copyright 2017 American Medical Association. All rights reserved. The codes documented in this report are preliminary and upon coder review may  be revised to meet current compliance requirements. Barney Drain, MD Barney Drain MD, MD 05/28/2018 8:16:56 AM This report has been signed electronically. Number of Addenda: 0

## 2018-05-28 NOTE — Telephone Encounter (Addendum)
Called pt, he wants me to speak to his wife. He will have her to call office.

## 2018-05-28 NOTE — Discharge Instructions (Signed)
I dilated your esophagus. I DID NOT SEE A DEFINITE NARROWING IN YOUR esophagus. You have MILD gastritis. I biopsied your SMALL BOWEL AND stomach.    DRINK WATER TO KEEP YOUR URINE LIGHT YELLOW.  DRINK BOOST, ENSURE, OR CARNATION INSTANT BREAKFAST FOUR TIMES A DAY.  AVOID REFLUX TRIGGERS. SEE INFO BELOW.  CONTINUE PROTONIX. TAKE 30 MINUTES PRIOR TO MEALS ONCE DAILY.  YOUR BIOPSY RESULTS WILL BE AVAILABLE IN 7 DAYS.  Complete ultrasound in 7-10 days.  FOLLOW UP IN 3 MOS E30 DYSPHAGIA/DYSPEPSIA WITH DR. Kaylin Marcon. They will call you.   UPPER ENDOSCOPY AFTER CARE Read the instructions outlined below and refer to this sheet in the next week. These discharge instructions provide you with general information on caring for yourself after you leave the hospital. While your treatment has been planned according to the most current medical practices available, unavoidable complications occasionally occur. If you have any problems or questions after discharge, call DR. Daylon Lafavor, [phone].  ACTIVITY  You may resume your regular activity, but move at a slower pace for the next 24 hours.   Take frequent rest periods for the next 24 hours.   Walking will help get rid of the air and reduce the bloated feeling in your belly (abdomen).   No driving for 24 hours (because of the medicine (anesthesia) used during the test).   You may shower.   Do not sign any important legal documents or operate any machinery for 24 hours (because of the anesthesia used during the test).    NUTRITION  Drink plenty of fluids.   You may resume your normal diet as instructed by your doctor.   Begin with a light meal and progress to your normal diet. Heavy or fried foods are harder to digest and may make you feel sick to your stomach (nauseated).   Avoid alcoholic beverages for 24 hours or as instructed.    MEDICATIONS  You may resume your normal medications.   WHAT YOU CAN EXPECT TODAY  Some feelings of  bloating in the abdomen.   Passage of more gas than usual.    IF YOU HAD A BIOPSY TAKEN DURING THE UPPER ENDOSCOPY:  Eat a soft diet IF YOU HAVE NAUSEA, BLOATING, ABDOMINAL PAIN, OR VOMITING.    FINDING OUT THE RESULTS OF YOUR TEST Not all test results are available during your visit. DR. Oneida Alar WILL CALL YOU WITHIN 14 DAYS OF YOUR PROCEDUE WITH YOUR RESULTS. Do not assume everything is normal if you have not heard from DR. Jacquelina Hewins, CALL HER OFFICE AT [phone].  SEEK IMMEDIATE MEDICAL ATTENTION AND CALL THE OFFICE: [phone] IF:  You have more than a spotting of blood in your stool.   Your belly is swollen (abdominal distention).   You are nauseated or vomiting.   You have a temperature over 101F.   You have abdominal pain or discomfort that is severe or gets worse throughout the day.  DYSPHAGIA DYSPHAGIA can be caused by stomach acid backing up into the tube that carries food from the mouth down to the stomach (lower esophagus).  TREATMENT There are a number of medicines used to treat DYSPHAGIA including: Antacids.  Proton-pump inhibitors: PROTONIX ZANTAC/PEPCID    Lifestyle and home remedies to control REFLUX and regurgitation  You may eliminate or reduce the frequency of heartburn by making the following lifestyle changes:   Control your weight. Being overweight is a major risk factor for heartburn and GERD. Excess pounds put pressure on your abdomen, pushing up your  stomach and causing acid to back up into your esophagus.    Eat smaller meals. 4 TO 6 MEALS A DAY. This reduces pressure on the lower esophageal sphincter, helping to prevent the valve from opening and acid from washing back into your esophagus.    Loosen your belt. Clothes that fit tightly around your waist put pressure on your abdomen and the lower esophageal sphincter.     Eliminate heartburn triggers. Everyone has specific triggers. Common triggers such as fatty or fried foods, spicy food,  tomato sauce, carbonated beverages, alcohol, chocolate, mint, garlic, onion, caffeine and nicotine may make heartburn worse.    Avoid stooping or bending. Tying your shoes is OK. Bending over for longer periods to weed your garden isn't, especially soon after eating.    Don't lie down after a meal. Wait at least three to four hours after eating before going to bed, and don't lie down right after eating.   Alternative medicine  Several home remedies exist for treating GERD, but they provide only temporary relief. They include drinking baking soda (sodium bicarbonate) added to water or drinking other fluids such as baking soda mixed with cream of tartar and water.  Although these liquids create temporary relief by neutralizing, washing away or buffering acids, eventually they aggravate the situation by adding gas and fluid to your stomach, increasing pressure and causing more acid reflux. Further, adding more sodium to your diet may increase your blood pressure and add stress to your heart, and excessive bicarbonate ingestion can alter the acid-base balance in your body.

## 2018-05-28 NOTE — Anesthesia Preprocedure Evaluation (Signed)
Anesthesia Evaluation  Patient identified by MRN, date of birth, ID band Patient awake    Reviewed: Allergy & Precautions, H&P , NPO status , Patient's Chart, lab work & pertinent test results, reviewed documented beta blocker date and time   Airway Mallampati: II  TM Distance: >3 FB Neck ROM: full    Dental no notable dental hx. (+) Missing, Dental Advidsory Given   Pulmonary neg pulmonary ROS, shortness of breath and with exertion, former smoker,    Pulmonary exam normal breath sounds clear to auscultation       Cardiovascular Exercise Tolerance: Good hypertension, On Medications +CHF  negative cardio ROS   Rhythm:regular Rate:Normal     Neuro/Psych negative neurological ROS  negative psych ROS   GI/Hepatic negative GI ROS, Neg liver ROS,   Endo/Other  negative endocrine ROS  Renal/GU Renal diseasenegative Renal ROS  negative genitourinary   Musculoskeletal   Abdominal   Peds  Hematology negative hematology ROS (+) anemia ,   Anesthesia Other Findings Accelerated/malignant HTN, w/ h/o angina, T waves in EKG, Syst/Diast CHF EF of 35-40 2014, refused cath Stage III KD "malnutrition of moderate degree"  Reproductive/Obstetrics negative OB ROS                             Anesthesia Physical Anesthesia Plan  ASA: IV  Anesthesia Plan: MAC   Post-op Pain Management:    Induction:   PONV Risk Score and Plan:   Airway Management Planned:   Additional Equipment:   Intra-op Plan:   Post-operative Plan:   Informed Consent: I have reviewed the patients History and Physical, chart, labs and discussed the procedure including the risks, benefits and alternatives for the proposed anesthesia with the patient or authorized representative who has indicated his/her understanding and acceptance.   Dental Advisory Given  Plan Discussed with: CRNA and Anesthesiologist  Anesthesia Plan  Comments:         Anesthesia Quick Evaluation

## 2018-05-28 NOTE — Telephone Encounter (Signed)
Called Ronnie [name] at [facility], he advised to order VAS [place] Mesenteric Duplex. Test isn't done at [facility]. Pt will need to go to [facility] on [address]. Test scheduled for 06/01/18 at 8:00am, arrive at 7:45am. NPO after midnight prior to test. Avoid gassy food 05/31/18. No gum, candy, or cigarettes morning of test.

## 2018-05-28 NOTE — Telephone Encounter (Signed)
PT NEED MESENTERIC DOPPLERS WITHIN THE NEXT 7-10 DAYS, Dx: POSTPRANDIAL ABDOMINAL PAIN, WEIGHT LOSS, EVALUATE FOR MESENTERIC STENOSIS.

## 2018-05-28 NOTE — Anesthesia Postprocedure Evaluation (Signed)
Anesthesia Post Note  Patient: Ronnie [name]  Procedure(s) Performed: ESOPHAGOGASTRODUODENOSCOPY (EGD) WITH PROPOFOL (N/A ) SAVORY DILATION (N/A ) BIOPSY  Patient location during evaluation: PACU Anesthesia Type: MAC Level of consciousness: awake and alert and oriented Pain management: pain level controlled Vital Signs Assessment: post-procedure vital signs reviewed and stable Respiratory status: spontaneous breathing Cardiovascular status: blood pressure returned to baseline and stable Postop Assessment: no apparent nausea or vomiting Anesthetic complications: no Comments: Late entry.     Last Vitals:  Vitals:   05/28/18 0830 05/28/18 0840  BP: (!) 175/93 (!) 147/78  Pulse: 71 69  Resp: 18 20  Temp:  36.6 C  SpO2: 99% 100%    Last Pain:  Vitals:   05/28/18 0840  TempSrc: Oral  PainSc: 0-No pain                 Yidel Teuscher

## 2018-05-28 NOTE — Transfer of Care (Signed)
Immediate Anesthesia Transfer of Care Note  Patient: Ronnie [name]  Procedure(s) Performed: ESOPHAGOGASTRODUODENOSCOPY (EGD) WITH PROPOFOL (N/A ) SAVORY DILATION (N/A ) BIOPSY  Patient Location: PACU  Anesthesia Type:MAC  Level of Consciousness: awake  Airway & Oxygen Therapy: Patient Spontanous Breathing  Post-op Assessment: Report given to RN  Post vital signs: Reviewed  Last Vitals:  Vitals Value Taken Time  BP    Temp    Pulse 63 05/28/2018  8:05 AM  Resp 20 05/28/2018  8:05 AM  SpO2 99 % 05/28/2018  8:05 AM  Vitals shown include unvalidated device data.  Last Pain:  Vitals:   05/28/18 0738  TempSrc:   PainSc: 0-No pain      Patients Stated Pain Goal: 7 (37/90/24 0973)  Complications: No apparent anesthesia complications

## 2018-05-28 NOTE — H&P (Signed)
Primary Care Physician:  Wyatt Haste, NP Primary Gastroenterologist:  Dr. Oneida Alar  Pre-Procedure History & Physical: HPI:  Ronnie [name] is a 72 y.o. male here for [facility].  Past Medical History:  Diagnosis Date  . Chronic combined systolic (congestive) and diastolic (congestive) heart failure (HCC)    a. EF 35 to 40% by echocardiogram in 2014 --> declined cath at that time and did not reestablish care with Cardiology until 04/2018  . Dyslipidemia   . Hypertension   . Shortness of breath     Past Surgical History:  Procedure Laterality Date  . DENTAL SURGERY      Prior to Admission medications   Medication Sig Start Date End Date Taking? Authorizing Provider  albuterol (PROVENTIL HFA;VENTOLIN HFA) 108 (90 BASE) MCG/ACT inhaler Inhale 2 puffs into the lungs 2 (two) times daily as needed for wheezing or shortness of breath.   Yes [provider]  aspirin EC 81 MG EC tablet Take 1 tablet (81 mg total) by mouth daily. 06/17/13  Yes Short, Noah Delaine, MD  carvedilol (COREG) 12.5 MG tablet Take 1 tablet (12.5 mg total) by mouth 2 (two) times daily with a meal. 05/12/18  Yes Manuella Ghazi, Pratik D, DO  citalopram (CELEXA) 10 MG tablet Take 1 tablet (10 mg total) by mouth daily. 05/13/18  Yes Shah, Pratik D, DO  docusate sodium (COLACE) 100 MG capsule Take 100 mg by mouth daily as needed for mild constipation.   Yes [provider]  escitalopram (LEXAPRO) 20 MG tablet Take 20 mg by mouth daily.   Yes [provider]  fluticasone (FLONASE) 50 MCG/ACT nasal spray Place 1 spray into both nostrils daily as needed for allergies or rhinitis.   Yes [provider]  furosemide (LASIX) 20 MG tablet TAKE 1 TABLET AS NEEDED FOR SWELLING Patient taking differently: Take 20-40 mg by mouth daily as needed for edema.  04/07/18  Yes Branch, Alphonse Guild, MD  hydrALAZINE (APRESOLINE) 25 MG tablet Take 1 tablet (25 mg total) by mouth every 8 (eight) hours. Patient taking  differently: Take 25 mg by mouth 2 (two) times daily.  05/12/18  Yes Shah, Pratik D, DO  ipratropium-albuterol (DUONEB) 0.5-2.5 (3) MG/3ML SOLN Take 3 mLs by nebulization every 6 (six) hours as needed (shortness of breath).   Yes [provider]  isosorbide mononitrate (IMDUR) 30 MG 24 hr tablet Take 30 mg by mouth daily.  01/03/18  Yes [provider]  lisinopril (PRINIVIL,ZESTRIL) 10 MG tablet Take 10 mg by mouth daily.   Yes [provider]  meclizine (ANTIVERT) 25 MG tablet Take 25 mg by mouth 3 (three) times daily as needed for dizziness.   Yes [provider]  Multiple Vitamin (MULTIVITAMIN WITH MINERALS) TABS tablet Take 1 tablet by mouth daily.   Yes [provider]  ondansetron (ZOFRAN) 4 MG tablet Take 1 tablet (4 mg total) by mouth every 6 (six) hours as needed for nausea. 05/12/18  Yes Shah, Pratik D, DO  pantoprazole (PROTONIX) 40 MG tablet Take 40 mg by mouth daily.   Yes [provider]  ranitidine (ZANTAC) 150 MG tablet Take 150 mg by mouth at bedtime.    Yes [provider]  simvastatin (ZOCOR) 20 MG tablet Take 40 mg by mouth every evening.   Yes [provider]  lisinopril (PRINIVIL,ZESTRIL) 20 MG tablet Take 1 tablet (20 mg total) by mouth daily. 05/13/18   Manuella Ghazi, Pratik D, DO  simvastatin (ZOCOR) 40 MG tablet Take 1  tablet (40 mg total) by mouth every evening. Patient not taking: Reported on 05/27/2018 06/17/13   [name], MD    Allergies as of 05/13/2018 - Review Complete 05/12/2018  Allergen Reaction Noted  . Bee venom Shortness Of Breath and Swelling 05/07/2018    Family History  Problem Relation Age of Onset  . Heart disease Mother   . Stomach cancer Father   . Cervical cancer Sister   . Prostate cancer Brother   . Colon cancer Neg Hx   . Colon polyps Neg Hx     Social History   Socioeconomic History  . Marital status: Married    Spouse name: Not on file  . Number of children: Not on file   . Years of education: Not on file  . Highest education level: Not on file  Occupational History  . Not on file  Social Needs  . Financial resource strain: Not on file  . Food insecurity:    Worry: Not on file    Inability: Not on file  . Transportation needs:    Medical: Not on file    Non-medical: Not on file  Tobacco Use  . Smoking status: Former Smoker    Packs/day: 0.50    Years: 50.00    Pack years: 25.00    Types: Cigarettes    Last attempt to quit: 03/29/2018    Years since quitting: 0.1  . Smokeless tobacco: Never Used  . Tobacco comment: occasional cigarette  Substance and Sexual Activity  . Alcohol use: No  . Drug use: No  . Sexual activity: Not on file  Lifestyle  . Physical activity:    Days per week: Not on file    Minutes per session: Not on file  . Stress: Not on file  Relationships  . Social connections:    Talks on phone: Not on file    Gets together: Not on file    Attends religious service: Not on file    Active member of club or organization: Not on file    Attends meetings of clubs or organizations: Not on file    Relationship status: Not on file  . Intimate partner violence:    Fear of current or ex partner: Not on file    Emotionally abused: Not on file    Physically abused: Not on file    Forced sexual activity: Not on file  Other Topics Concern  . Not on file  Social History Narrative  . Not on file    Review of Systems: See HPI, otherwise negative ROS   Physical Exam: BP (!) 145/78   Temp 98 F (36.7 C) (Oral)   Resp 18  General:   Alert,  pleasant and cooperative in NAD Head:  Normocephalic and atraumatic. Neck:  Supple; Lungs:  Clear throughout to auscultation.    Heart:  Regular rate and rhythm. Abdomen:  Soft, nontender and nondistended. Normal bowel sounds, without guarding, and without rebound.   Neurologic:  Alert and  oriented x4;  grossly normal neurologically.  Impression/Plan:      DYSPHAGIA/dyspepsia  PLAN:  EGD/DIL TODAY. DISCUSSED PROCEDURE, BENEFITS, & RISKS: < 1% chance of medication reaction, bleeding, OR perforation.

## 2018-05-28 NOTE — Telephone Encounter (Signed)
Wife called office, informed of appt and details. Address and phone number given for [facility].

## 2018-06-01 ENCOUNTER — Encounter (HOSPITAL_COMMUNITY): Payer: Medicare Other

## 2018-06-02 ENCOUNTER — Other Ambulatory Visit: Payer: Self-pay | Admitting: Pharmacist

## 2018-06-02 ENCOUNTER — Encounter (HOSPITAL_COMMUNITY): Payer: Self-pay | Admitting: Gastroenterology

## 2018-06-02 NOTE — Telephone Encounter (Signed)
Please call pt. HIS stomach Bx shows gastritis.  HIS SMALL BOWEL BIOPSIES ARE NORMAL. WE WILL CALL HIM WHEN WE GET THE RESULTS FROM HIS ultrasound. NO SOURCE FOR HIS WEIGHT LOSS WAS IDENTIFIED ON HIS UPPER ENDOSCOPY.Marland Kitchen

## 2018-06-02 NOTE — Telephone Encounter (Signed)
Requested records.

## 2018-06-02 NOTE — Patient Outreach (Signed)
[place] [facility]) Care Management  06/02/2018  Ronnie [name] Aug 02, 1946 [id number]  [facility] pharmacy referral placed regarding medication management for Ronnie [name].  Patient reported to pharmacy technician during pre-procedural medication history that he was taking two duplicate SSRI antidepressants, citalopram and escitalopram.   Care coordination placed to Ronnie [name] pharmacy on file, [facility].  Per pharmacist,  -Escitalopram filled 12/08/2017 x 90 day supply, then deactivated.  No further refills placed.  -Citalopram filled 05/12/2018 x 30 day supply, first time fill.  No refills.   -Neither medication nor any other SSRI filled between 6/4 and 05/12/2018  Per review of chart, patient had hospitalization 8/1 - 05/12/2018 for uncontrolled BP and started on citalopram at discharge for anxiety and depression.  This correlates with fill history from [facility].    Unsuccessful call placed to Ronnie [name] to clarify medication regimen.  I left a HIPAA compliant voicemail on the mobile number and with Ronnie [name] requesting a return call.    Plan: I will update [facility] RN who is actively involved with Ronnie [name] and follow-up with patient again later this week.   Ralene Bathe, PharmD, [place] [phone]

## 2018-06-02 NOTE — Telephone Encounter (Signed)
OBTAIN VASCULAR U/S RESULTS. AND PLACE ON MY DESK.

## 2018-06-02 NOTE — Telephone Encounter (Signed)
Called, but not accepting calls at this time.

## 2018-06-03 NOTE — Telephone Encounter (Signed)
Tried to call and not accepting calls. Will mail a letter to call.  Need to update phone number when he calls also.

## 2018-06-04 ENCOUNTER — Ambulatory Visit: Payer: Self-pay | Admitting: Pharmacist

## 2018-06-05 ENCOUNTER — Encounter (HOSPITAL_COMMUNITY): Payer: Medicare Other

## 2018-06-05 ENCOUNTER — Other Ambulatory Visit: Payer: Self-pay | Admitting: Pharmacist

## 2018-06-05 NOTE — Patient Outreach (Signed)
[place] [facility]) Care Management  06/05/2018  [name] 02-May-1946 [id number]  Incoming call and voicemail received from [facility] regarding patient Ronnie [name].  Return call today unsuccessful.  I left a HIPAA compliant voicemail on the home phone for Ronnie [name] and on the mobile phone for Ronnie [name].  Patty's home phone is not currently in service.  Calling patient in reference to possible duplication of SSRI therapy based on patient's report during pre-procedural medication history.  Please see note from 06/02/2018 for full details on referral.    Plan: I will follow-up with patient again next week.   Ralene Bathe, PharmD, [place] [phone]

## 2018-06-09 ENCOUNTER — Ambulatory Visit: Payer: Medicare Other | Admitting: Nurse Practitioner

## 2018-06-09 ENCOUNTER — Ambulatory Visit: Payer: Self-pay | Admitting: Pharmacist

## 2018-06-09 ENCOUNTER — Other Ambulatory Visit: Payer: Self-pay | Admitting: Pharmacist

## 2018-06-09 NOTE — Patient Outreach (Signed)
[place] [facility]) Care Management  06/09/2018  KAJUAN GUYTON 26-Mar-1946 [id number]  Successful call placed to Mr. Tegtmeyer today.  HIPAA identifiers verified. Patient agreeable to review medication bottles at home.  He confirms he has an old bottle of Lexapro that he never finished taking however he is NOT currently taking it now.  Patient reports he is only taking Celexa.  Patient agreeable to discard Lexapro to avoid confusion in the future.  He reports no issues with Celexa and no need to change back to previous SSRI.  We reviewed indications for other medications that he is currently taking, including Coreg, Imdur, Protonix, and Lasix.  Patient denies any other medication questions or concerns.    Plan: I will remove Lexapro from patient's medication list as patient no longer taking.  I will close [facility] pharmacy case at this time.   Ralene Bathe, PharmD, [place] [phone]

## 2018-06-11 ENCOUNTER — Other Ambulatory Visit: Payer: Self-pay | Admitting: Licensed Clinical Social Worker

## 2018-06-11 ENCOUNTER — Ambulatory Visit (INDEPENDENT_AMBULATORY_CARE_PROVIDER_SITE_OTHER): Payer: Medicare Other | Admitting: Cardiology

## 2018-06-11 ENCOUNTER — Encounter: Payer: Self-pay | Admitting: Cardiology

## 2018-06-11 VITALS — BP 158/77 | HR 80 | Ht 72.0 in | Wt 144.8 lb

## 2018-06-11 DIAGNOSIS — I5022 Chronic systolic (congestive) heart failure: Secondary | ICD-10-CM

## 2018-06-11 DIAGNOSIS — R002 Palpitations: Secondary | ICD-10-CM

## 2018-06-11 DIAGNOSIS — I10 Essential (primary) hypertension: Secondary | ICD-10-CM

## 2018-06-11 MED ORDER — CARVEDILOL 25 MG PO TABS: 25.0000 mg | ORAL_TABLET | Freq: Two times a day (BID) | ORAL | 1 refills | Status: DC

## 2018-06-11 NOTE — Patient Outreach (Signed)
Assessment:  CSW spoke via phone with client. CSW verified client identity. CSW received verbal permission from client for CSW to speak with client about client needs. Client lives alone at his home in [place], [place]. He continues to use a medical alert necklace as needed for emergency support.  Client said he has occasional falls and has mobility challenges.  He does continue to use a cane or walker to assist him in ambulation .  CSW talked with Toula Moos about client care plan. CSW encouraged that client continue to talk with CSW in next 30 days to discuss transportation resources for client in the area. Client said he has some support from his spouse Malachy Mood. Client gave verbal permission for CSW to speak either with client or with his spouse, Malachy Mood , regarding client needs.  Client has been receiving [facility] nursing support with RN [name].  Client said he had appointment with cardiologist today.  Client said that Malachy Mood, his spouse, sometimes transports client to and from client appointments. CSW talked with Toula Moos about transport support with [facility]. CSW talked with client about scheduling process for transport with agency. CSW talked with Wind[place] about normal transport costs for use of agency transport help within [place], [place] . Client said he had his prescribed medications and is taking medications as prescribed.  CSW talked with [place] about family dynamics and stress issues faced by client. Client said he sometimes gets sad and has depressed symptoms. He said he is taking a medication as prescribed for depression. Client said Malachy Mood helps him obtain needed food items.  He said he sometimes does not sleep very well. He said he sometimes perspires a good bit.  He said he had talked with Rosealee Albee, Nurse Practiioner, previously about these symptoms.   Client said he cries occasionally.  CSW talked with client about current stress issues of client and about depression  symptoms of client.  CSW talked with client about managing depression symptoms of client.  Client said he has his next appointment with Rosealee Albee, Nurse Practitioner, on 06/23/18. CSW talked with [place] about [facility] program support in nursing, social work and pharmacy. CSW thanked [place] for phone call with CSW on 06/11/18.   Plan:  Client to talk with CSW in the next 30 days to discuss transportation resources available to client in the local area.   CSW to call client in 4 weeks to assess client needs.  Norva Riffle.Oluwadara Gorman MSW, LCSW Licensed Clinical Social Worker [facility] Care Management [phone]

## 2018-06-11 NOTE — Patient Instructions (Signed)
Your physician recommends that you schedule a follow-up appointment in: [place] has recommended you make the following change in your medication:   INCREASE COREG 25 MG TWICE DAILY   Thank you for choosing [facility]!!

## 2018-06-11 NOTE — Progress Notes (Signed)
Clinical Summary Ronnie [name] is a 72 y.o.male  seen today for follow up of the following medical problems.    1. Chronic systolic HF - records from 2014 indicate admission with acute HF. Echo at that time showed VLEF 35-40%, grade II diastolic dysfunction - he refused cath at that time - never followed up as outpatient.  05/2018 echo LVEF 25-30%, diffuse hypokinesis, grade II diastolic dysfunction. Moderate MR, moderate RV dysfunction Stable SOB/DOE since last visit. NO recent edema.  - home health nurse coming out helping with meds 2 times a week.      2. HTN - notes indicate prior history of medication noncompliance - compliant with current meds   3. Palpitations - occasional symptoms at times.      Past Medical History:  Diagnosis Date  . Chronic combined systolic (congestive) and diastolic (congestive) heart failure (HCC)    a. EF 35 to 40% by echocardiogram in 2014 --> declined cath at that time and did not reestablish care with Cardiology until 04/2018  . Dyslipidemia   . Hypertension   . Shortness of breath      Allergies  Allergen Reactions  . Bee Venom Shortness Of Breath and Swelling     Current Outpatient Medications  Medication Sig Dispense Refill  . albuterol (PROVENTIL HFA;VENTOLIN HFA) 108 (90 BASE) MCG/ACT inhaler Inhale 2 puffs into the lungs 2 (two) times daily as needed for wheezing or shortness of breath.    Marland Kitchen aspirin EC 81 MG EC tablet Take 1 tablet (81 mg total) by mouth daily.    . carvedilol (COREG) 12.5 MG tablet Take 1 tablet (12.5 mg total) by mouth 2 (two) times daily with a meal. 60 tablet 0  . citalopram (CELEXA) 10 MG tablet Take 1 tablet (10 mg total) by mouth daily. 30 tablet 0  . docusate sodium (COLACE) 100 MG capsule Take 100 mg by mouth daily as needed for mild constipation.    . fluticasone (FLONASE) 50 MCG/ACT nasal spray Place 1 spray into both nostrils daily as needed for allergies or rhinitis.    . furosemide  (LASIX) 20 MG tablet TAKE 1 TABLET AS NEEDED FOR SWELLING (Patient taking differently: Take 20-40 mg by mouth daily as needed for edema. ) 90 tablet 1  . hydrALAZINE (APRESOLINE) 25 MG tablet Take 1 tablet (25 mg total) by mouth every 8 (eight) hours. (Patient taking differently: Take 25 mg by mouth 2 (two) times daily. ) 90 tablet 0  . ipratropium-albuterol (DUONEB) 0.5-2.5 (3) MG/3ML SOLN Take 3 mLs by nebulization every 6 (six) hours as needed (shortness of breath).    . isosorbide mononitrate (IMDUR) 30 MG 24 hr tablet Take 30 mg by mouth daily.   0  . lisinopril (PRINIVIL,ZESTRIL) 10 MG tablet Take 10 mg by mouth daily.    . meclizine (ANTIVERT) 25 MG tablet Take 25 mg by mouth 3 (three) times daily as needed for dizziness.    . Multiple Vitamin (MULTIVITAMIN WITH MINERALS) TABS tablet Take 1 tablet by mouth daily.    . ondansetron (ZOFRAN) 4 MG tablet Take 1 tablet (4 mg total) by mouth every 6 (six) hours as needed for nausea. 20 tablet 0  . pantoprazole (PROTONIX) 40 MG tablet Take 40 mg by mouth daily.    . ranitidine (ZANTAC) 150 MG tablet Take 150 mg by mouth at bedtime.     . simvastatin (ZOCOR) 20 MG tablet Take 40 mg by mouth every evening.  No current facility-administered medications for this visit.      Past Surgical History:  Procedure Laterality Date  . BIOPSY  05/28/2018   Procedure: BIOPSY;  Surgeon: Danie Binder, MD;  Location[facility];  Service: Endoscopy;;  duodenal biopsy , gastric biopsy  . DENTAL SURGERY    . ESOPHAGOGASTRODUODENOSCOPY (EGD) WITH PROPOFOL N/A 05/28/2018   Procedure: ESOPHAGOGASTRODUODENOSCOPY (EGD) WITH PROPOFOL;  Surgeon: Danie Binder, MD;  Location: [facility];  Service: Endoscopy;  Laterality: N/A;  7:30am  . SAVORY DILATION N/A 05/28/2018   Procedure: SAVORY DILATION;  Surgeon: Danie Binder, MD;  Location: [facility];  Service: Endoscopy;  Laterality: N/A;     Allergies  Allergen Reactions  . Bee Venom Shortness Of  Breath and Swelling      Family History  Problem Relation Age of Onset  . Heart disease Mother   . Stomach cancer Father   . Cervical cancer Sister   . Prostate cancer Brother   . Colon cancer Neg Hx   . Colon polyps Neg Hx      Social History Ronnie [name] reports that he quit smoking about 2 months ago. His smoking use included cigarettes. He has a 25.00 pack-year smoking history. He has never used smokeless tobacco. Ronnie [name] reports that he does not drink alcohol.   Review of Systems CONSTITUTIONAL: No weight loss, fever, chills, weakness or fatigue.  HEENT: Eyes: No visual loss, blurred vision, double vision or yellow sclerae.No hearing loss, sneezing, congestion, runny nose or sore throat.  SKIN: No rash or itching.  CARDIOVASCULAR: per hpi RESPIRATORY: No shortness of breath, cough or sputum.  GASTROINTESTINAL: No anorexia, nausea, vomiting or diarrhea. No abdominal pain or blood.  GENITOURINARY: No burning on urination, no polyuria NEUROLOGICAL: +vertigo MUSCULOSKELETAL: No muscle, back pain, joint pain or stiffness.  LYMPHATICS: No enlarged nodes. No history of splenectomy.  PSYCHIATRIC: No history of depression or anxiety.  ENDOCRINOLOGIC: No reports of sweating, cold or heat intolerance. No polyuria or polydipsia.  Marland Kitchen   Physical Examination Vitals:   06/11/18 0953  BP: (!) 158/77  Pulse: 80  SpO2: 98%   Vitals:   06/11/18 0953  Weight: 144 lb 12.8 oz (65.7 kg)  Height: 6' (1.829 m)    Gen: resting comfortably, no acute distress HEENT: no scleral icterus, pupils equal round and reactive, no palptable cervical adenopathy,  CV: RRR, no m/r/g, no jvd Resp: Clear to auscultation bilaterally GI: abdomen is soft, non-tender, non-distended, normal bowel sounds, no hepatosplenomegaly MSK: extremities are warm, no edema.  Skin: warm, no rash Neuro:  no focal deficits Psych: appropriate affect   Diagnostic Studies 06/2013 echo Study Conclusions  - Left  ventricle: The cavity size was at the upper limits of normal. Wall thickness was increased in a pattern of mild LVH. There was mild concentric hypertrophy. Systolic function was moderately reduced. The estimated ejection fraction was in the range of 35% to 40%. Diffuse hypokinesis. Features are consistent with a pseudonormal left ventricular filling pattern, with concomitant abnormal relaxation and increased filling pressure (grade 2 diastolic dysfunction). Doppler parameters are consistent with elevated mean left atrial filling pressure. - Aortic valve: Trileaflet; mildly thickened leaflets. - Mitral valve: Mild to moderate regurgitation. - Right ventricle: Systolic pressure was increased. - Atrial septum: No defect or patent foramen ovale was identified. - Pulmonary arteries: PA peak pressure: 76mm Hg (S). Impressions:  05/2018 echo Study Conclusions  - Left ventricle: The cavity size was normal. Wall thickness was  increased in a pattern of mild LVH. Systolic function was   severely reduced. The estimated ejection fraction was in the   range of 25% to 30%. Diffuse hypokinesis. Features are consistent   with a pseudonormal left ventricular filling pattern, with   concomitant abnormal relaxation and increased filling pressure   (grade 2 diastolic dysfunction). Doppler parameters are   consistent with indeterminate ventricular filling pressure. - Aortic valve: Mildly calcified annulus. Trileaflet. There was   mild regurgitation. - Mitral valve: There was moderate regurgitation. - Left atrium: The atrium was moderately dilated. - Right ventricle: Systolic function was moderately reduced. - Tricuspid valve: There was mild regurgitation. - Pulmonary arteries: PA peak pressure: 32 mm Hg (S).  Assessment and Plan  1. Chronic sysotlic HF - medical therapy limited due to significant orthostatic symptoms, though his history of dizziness today sounds more  consistent with vertigo. We will try increasing coreg to 25mg  bid. Poor renal function also limits his regimen - continue to defer cath due to poor renal function.   2. HTN - elevated in clinic, increase coreg to 25mg  bid.     3. Palpitations - increase coreg and follow symptoms.    f/u 1 month. Further titrate hydralazine/nitrates at follow up.    Arnoldo Lenis, M.D.

## 2018-06-22 ENCOUNTER — Encounter: Payer: Self-pay | Admitting: *Deleted

## 2018-06-22 ENCOUNTER — Other Ambulatory Visit: Payer: Self-pay | Admitting: *Deleted

## 2018-06-22 NOTE — Patient Outreach (Signed)
[facility]) Care Management  06/22/2018  Ronnie [name] December 27, 1945 [id number]   Covering Ronnie [name],   Spoke with pt today and introduced [facility] services and purpose for today's call. Identifiers confirmed. Pt does not remember previous coverage of information from the Case Manager but receptive of reviewing the information . Plan of care discussed with existing goals met (no falls and pt reports eating better) however HF discussed in detail today and plan of care initiated on this diagnosis with goals and interventions now in place. Will educate on HF zones and verified pt is in the GREEN zone with a eight today and yesterday at 142 lbs and last week reported at 143 lbs with symptoms reported.  Discussed the three zones and what to do if acute symptoms should occur along with the importance of daily weights due to the residual fluid retention that maybe experienced. Another issues today related to a needed refill. Pharmacy has called however RN encouraged pt to contact his provider Dr. Harl [name] and request his Apresoline be refilled with no additional dosage left to start tomorrow. Pt verbalized an understanding and will continue his provider today.  Pt verified [facility] continue to visit and takes his vitals and fills his pillbox. RN stress the importance of learning this task of filling his pillbox if he wishes to continue taking his medication from the pill-box. Also encouraged pt to ask questions related while [facility] remains in the home for ongoing services.  Pt also verified Ronnie [name] (social worker via [facility]) continues to work with his in provider transportation resources.    Will continue to be available and provider contact number for pt if needed. Will plan to follow up in few weeks on pt's progress concerning managing his HF and taking his medications as prescribed with good attendance to all his medical appointments. Will update plan of care accordingly at that  time.  THN CM Care Plan Problem One     Most Recent Value  Care Plan Problem One  Knowledge deficit related to HF  Role Documenting the Problem One  Care Management Coordinator  Care Plan for Problem One  Active  THN Long Term Goal   Pt will be able to recognize HF zones and weight daily within the next 90 days.  THN Long Term Goal Start Date  06/22/18  Interventions for Problem One Long Term Goal  Will discuss HF zones and what ot do if acute and stress the importance of remaining in the GREEN zone.    THN CM Care Plan Problem Two     Most Recent Value  Care Plan Problem Two  Pt has decreased endurance and fall risk related to vertigo  Role Documenting the Problem Two  Care Management Coordinator  Care Plan for Problem Two  Not Active  THN CM Short Term Goal #1   Pt will verbalize/ demonstrate safety precautions within 30 days  THN CM Short Term Goal #1 Start Date  05/27/18  [facility] CM Short Term Goal #1 Met Date   06/22/18    [facility] CM Care Plan Problem Three     Most Recent Value  Care Plan Problem Three  Pt high nutritional risk  Role Documenting the Problem Three  Care Management Coordinator  Care Plan for Problem Three  Not Active  THN CM Short Term Goal #1   Pt will verbalize interventions to promote better nutrition within 30 days  THN CM Short Term Goal #1 Start Date  05/27/18  [facility] CM Short Term Goal #1 Met Date  06/22/18      Raina Mina, RN Care Management Coordinator [place] Office [phone]

## 2018-06-24 ENCOUNTER — Ambulatory Visit: Payer: Self-pay | Admitting: *Deleted

## 2018-07-14 ENCOUNTER — Other Ambulatory Visit: Payer: Self-pay | Admitting: Licensed Clinical Social Worker

## 2018-07-14 NOTE — Patient Outreach (Signed)
Assessment:  CSW spoke via phone with client. CSW verified client identity. CSW received verbal permission from client for CSW to speak with client about client needs. Client continues to use a medical alert necklace as needed for emergency support. Client has mobility challenges.  He said he continues to use a cane or a walker to assist him in ambulation. CSW talked with Toula Moos about client care plan.  CSW encouraged client to talk with CSW in next 30 days about transportation resources for client in the area. CSW reminded client of [facility] area [employer] and transport assistance through that agency.  CSW has talked with client about scheduling process to schedule transport with that agency. CSW has also provided client with several transport voucher tickets for client to use with transport assistance with that transport agency.Client said he has his prescribed medications and is taking medications as prescribed.Client said he sometimes is sad or gets depressed. He said he is taking a medication as prescribed for depression.  CSW talked with client about depression symptoms of client and about stress issues of client. Client said he sometimes has trouble sleeping. He said he plans to talk further with Rosealee Albee about current symptoms of client. He said his wife, Malachy Mood is supportive. She procures needed food items for client. Client see Rosealee Albee, Nurse Practitioner, as medical provider for client.  Client said that [facility] had completed their in home support services for client. Cleint said that RN from [facility] had calld him recenlty to speak about client needs.  CSW thanked client for phone call with CSW on 07/14/18. CSW talked with client about [facility] program services in nursing, social work and pharmacy. CSW encouraged client to call CSW as needed to discuss social work needs of client.   Plan:  Client to talk with CSW in the next 30 days to discuss  transportation resources for client in the area.   CSW to call client in 4 weeks to assess client needs at that time.  Norva Riffle.Shala Baumbach MSW, LCSW Licensed Clinical Social Worker [facility] Care Management [phone]

## 2018-07-16 ENCOUNTER — Encounter: Payer: Self-pay | Admitting: Gastroenterology

## 2018-07-20 ENCOUNTER — Other Ambulatory Visit: Payer: Self-pay | Admitting: *Deleted

## 2018-07-20 NOTE — Patient Outreach (Signed)
[facility]) Care Management  07/20/2018  SKIPPER DACOSTA 1946/03/04 [id number]    Telephone Assessment   RN spoke with pt today and received an update on pt's progress with managing his care. Pt reports he has not been weighing daily but reports a weight for today at 142.4 lbs with no related HF symptoms. RN reviewed pt's current plan of care along with goals and interventions. Will strongly encouraged to adhere to the plan of care and generated another goal for daily weights. Will adjust interventions accordingly based upon pt's progress. Will reiterated on HF zones and and verify pt remains in the GREEN zone with no acute symptoms. Pt aware of what to do if acute symptoms should occur.  Pt with no additional inquires or request at this time. Pt receptive to continuing disease management with a [facility] health coach for management of care. Pt receptive to this change and agrees with the current plan of care for managing his ongoing CHF as noted below. Will alert primary provider of pt's disposition with [facility] services.  THN CM Care Plan Problem One     Most Recent Value  Care Plan Problem One  Knowledge deficit related to CHF  Role Documenting the Problem One  Care Management Coordinator  Care Plan for Problem One  Active  THN Long Term Goal   Pt will be able to recognize HF zones and weight daily within the next 90 days.  THN Long Term Goal Start Date  06/22/18  Interventions for Problem One Long Term Goal  Will verfy pt has not encountered any symptoms. Stress the importance of daily management to avoid acute episodes.  THN CM Short Term Goal #1   Pt will weight daily and document readings over the next 30 days  THN CM Short Term Goal #1 Start Date  07/20/18  Interventions for Short Term Goal #1  Will discuss the importance of monitoring his weights related to fuild retention and how this affects his CHF. Will encouraged adherence with daily weights.      Raina Mina,  RN Care Management Coordinator [place] Office [phone]

## 2018-07-30 ENCOUNTER — Encounter: Payer: Self-pay | Admitting: Cardiology

## 2018-07-30 ENCOUNTER — Ambulatory Visit (INDEPENDENT_AMBULATORY_CARE_PROVIDER_SITE_OTHER): Payer: Medicare Other | Admitting: Cardiology

## 2018-07-30 VITALS — BP 140/84 | HR 91 | Ht 72.0 in | Wt 147.0 lb

## 2018-07-30 DIAGNOSIS — I5022 Chronic systolic (congestive) heart failure: Secondary | ICD-10-CM

## 2018-07-30 DIAGNOSIS — I10 Essential (primary) hypertension: Secondary | ICD-10-CM

## 2018-07-30 NOTE — Progress Notes (Signed)
Clinical Summary Ronnie [name] is a 72 y.o.male seen today for follow up of the following medical problems.   1. Chronic systolic HF - records from 2014 indicate admission with acute HF. Echo at that time showed VLEF 35-40%, grade II diastolic dysfunction - he refused cath at that time - never followed up as outpatient.  05/2018 echo LVEF 25-30%, diffuse hypokinesis, grade II diastolic dysfunction. Moderate MR, moderate RV dysfunction  - last visit we increased coreg to 25mg  bid. Some dizziness - occasional leg swelling. Home weights 141 lbs and stable.  - mixed compliance with meds. Misses 1-2 days per week.     2. HTN - mixed compliance with meds   - cataract surgery, possible dental surgery on hold due to ongoing heart issues.   Past Medical History:  Diagnosis Date  . Chronic combined systolic (congestive) and diastolic (congestive) heart failure (HCC)    a. EF 35 to 40% by echocardiogram in 2014 --> declined cath at that time and did not reestablish care with Cardiology until 04/2018  . Dyslipidemia   . Hypertension   . Shortness of breath      Allergies  Allergen Reactions  . Bee Venom Shortness Of Breath and Swelling     Current Outpatient Medications  Medication Sig Dispense Refill  . albuterol (PROVENTIL HFA;VENTOLIN HFA) 108 (90 BASE) MCG/ACT inhaler Inhale 2 puffs into the lungs 2 (two) times daily as needed for wheezing or shortness of breath.    Marland Kitchen aspirin EC 81 MG EC tablet Take 1 tablet (81 mg total) by mouth daily.    . carvedilol (COREG) 25 MG tablet Take 1 tablet (25 mg total) by mouth 2 (two) times daily. 180 tablet 1  . citalopram (CELEXA) 10 MG tablet Take 1 tablet (10 mg total) by mouth daily. 30 tablet 0  . docusate sodium (COLACE) 100 MG capsule Take 100 mg by mouth daily as needed for mild constipation.    . fluticasone (FLONASE) 50 MCG/ACT nasal spray Place 1 spray into both nostrils daily as needed for allergies or rhinitis.      . furosemide (LASIX) 20 MG tablet TAKE 1 TABLET AS NEEDED FOR SWELLING (Patient taking differently: Take 20-40 mg by mouth daily as needed for edema. ) 90 tablet 1  . hydrALAZINE (APRESOLINE) 25 MG tablet Take 1 tablet (25 mg total) by mouth every 8 (eight) hours. (Patient taking differently: Take 25 mg by mouth 2 (two) times daily. ) 90 tablet 0  . ipratropium-albuterol (DUONEB) 0.5-2.5 (3) MG/3ML SOLN Take 3 mLs by nebulization every 6 (six) hours as needed (shortness of breath).    . isosorbide mononitrate (IMDUR) 30 MG 24 hr tablet Take 30 mg by mouth daily.   0  . lisinopril (PRINIVIL,ZESTRIL) 10 MG tablet Take 10 mg by mouth daily.    . meclizine (ANTIVERT) 25 MG tablet Take 25 mg by mouth 3 (three) times daily as needed for dizziness.    . Multiple Vitamin (MULTIVITAMIN WITH MINERALS) TABS tablet Take 1 tablet by mouth daily.    . ondansetron (ZOFRAN) 4 MG tablet Take 1 tablet (4 mg total) by mouth every 6 (six) hours as needed for nausea. (Patient not taking: Reported on 06/22/2018) 20 tablet 0  . pantoprazole (PROTONIX) 40 MG tablet Take 40 mg by mouth daily.    . ranitidine (ZANTAC) 150 MG tablet Take 150 mg by mouth at bedtime.     . simvastatin (ZOCOR) 20 MG tablet Take 40 mg by  mouth every evening.     No current facility-administered medications for this visit.      Past Surgical History:  Procedure Laterality Date  . BIOPSY  05/28/2018   Procedure: BIOPSY;  Surgeon: Danie Binder, MD;  [facility];  Service: Endoscopy;;  duodenal biopsy , gastric biopsy  . DENTAL SURGERY    . ESOPHAGOGASTRODUODENOSCOPY (EGD) WITH PROPOFOL N/A 05/28/2018   Procedure: ESOPHAGOGASTRODUODENOSCOPY (EGD) WITH PROPOFOL;  Surgeon: Danie Binder, MD;  [facility];  Service: Endoscopy;  Laterality: N/A;  7:30am  . SAVORY DILATION N/A 05/28/2018   Procedure: SAVORY DILATION;  Surgeon: Danie Binder, MD;  Location: [facility];  Service: Endoscopy;  Laterality: N/A;      Allergies  Allergen Reactions  . Bee Venom Shortness Of Breath and Swelling      Family History  Problem Relation Age of Onset  . Heart disease Mother   . Stomach cancer Father   . Cervical cancer Sister   . Prostate cancer Brother   . Colon cancer Neg Hx   . Colon polyps Neg Hx      Social History Mr. Germano reports that he has been smoking cigarettes. He has a 25.00 pack-year smoking history. He has never used smokeless tobacco. Mr. Corpus reports that he does not drink alcohol.   Review of Systems CONSTITUTIONAL: No weight loss, fever, chills, weakness or fatigue.  HEENT: Eyes: No visual loss, blurred vision, double vision or yellow sclerae.No hearing loss, sneezing, congestion, runny nose or sore throat.  SKIN: No rash or itching.  CARDIOVASCULAR: per hpi RESPIRATORY: per hpi GASTROINTESTINAL: No anorexia, nausea, vomiting or diarrhea. No abdominal pain or blood.  GENITOURINARY: No burning on urination, no polyuria NEUROLOGICAL: No headache, dizziness, syncope, paralysis, ataxia, numbness or tingling in the extremities. No change in bowel or bladder control.  MUSCULOSKELETAL: No muscle, back pain, joint pain or stiffness.  LYMPHATICS: No enlarged nodes. No history of splenectomy.  PSYCHIATRIC: No history of depression or anxiety.  ENDOCRINOLOGIC: No reports of sweating, cold or heat intolerance. No polyuria or polydipsia.  Marland Kitchen   Physical Examination Vitals:   07/30/18 1600  BP: 140/84  Pulse: 91  SpO2: 99%   Vitals:   07/30/18 1600  Weight: 147 lb (66.7 kg)  Height: 6' (1.829 m)    Gen: resting comfortably, no acute distress HEENT: no scleral icterus, pupils equal round and reactive, no palptable cervical adenopathy,  CV: RRR, no m/r/g, no jvd Resp: Clear to auscultation bilaterally GI: abdomen is soft, non-tender, non-distended, normal bowel sounds, no hepatosplenomegaly MSK: extremities are warm, no edema.  Skin: warm, no rash Neuro:  no  focal deficits Psych: appropriate affect   Diagnostic Studies 06/2013 echo Study Conclusions  - Left ventricle: The cavity size was at the upper limits of normal. Wall thickness was increased in a pattern of mild LVH. There was mild concentric hypertrophy. Systolic function was moderately reduced. The estimated ejection fraction was in the range of 35% to 40%. Diffuse hypokinesis. Features are consistent with a pseudonormal left ventricular filling pattern, with concomitant abnormal relaxation and increased filling pressure (grade 2 diastolic dysfunction). Doppler parameters are consistent with elevated mean left atrial filling pressure. - Aortic valve: Trileaflet; mildly thickened leaflets. - Mitral valve: Mild to moderate regurgitation. - Right ventricle: Systolic pressure was increased. - Atrial septum: No defect or patent foramen ovale was identified. - Pulmonary arteries: PA peak pressure: 71mm Hg (S). Impressions:  05/2018 echo Study Conclusions  -  Left ventricle: The cavity size was normal. Wall thickness was increased in a pattern of mild LVH. Systolic function was severely reduced. The estimated ejection fraction was in the range of 25% to 30%. Diffuse hypokinesis. Features are consistent with a pseudonormal left ventricular filling pattern, with concomitant abnormal relaxation and increased filling pressure (grade 2 diastolic dysfunction). Doppler parameters are consistent with indeterminate ventricular filling pressure. - Aortic valve: Mildly calcified annulus. Trileaflet. There was mild regurgitation. - Mitral valve: There was moderate regurgitation. - Left atrium: The atrium was moderately dilated. - Right ventricle: Systolic function was moderately reduced. - Tricuspid valve: There was mild regurgitation. - Pulmonary arteries: PA peak pressure: 32 mm Hg (S).    Assessment and Plan  1. Chronic sysotlic HF - medical  therapy limited due to significant orthostatic symptoms. Also continues to have mixed compliance with medications. Encouraged strict medication compliance.  - repeat labs, pending renal function will need to reconsider cath. Not sure Cr is going to signifcantly improve, we may need to accept the increased risk for cath, his CHF and LV dysfunction has been progressing.    2. HTN - has not taken all meds yet today, continue to monitor.    3. CKD III - repeat labs   F/u pending lab results. Pending Cr consider RHC/LHC  I have reviewed the risks, indications, and alternatives to cardiac catheterization, possible angioplasty, and stenting with the patient  today. Risks include but are not limited to bleeding, infection, vascular injury, stroke, myocardial infection, arrhythmia, kidney injury, radiation-related injury in the case of prolonged fluoroscopy use, emergency cardiac surgery, and death. The patient understands the risks of serious complication is 1-2 in 6825 with diagnostic cardiac cath and 1-2% or less with angioplasty/stenting.     Arnoldo Lenis, M.D

## 2018-07-30 NOTE — Patient Instructions (Signed)
Your physician recommends that you schedule a follow-up appointment in: PENDING LAB RESULTS  Your physician recommends that you continue on your current medications as directed. Please refer to the Current Medication list given to you today.  Your physician recommends that you return for lab work BMP/CBC - [place]   Thank you for choosing [facility]!!

## 2018-07-31 ENCOUNTER — Encounter: Payer: Self-pay | Admitting: Cardiology

## 2018-08-17 ENCOUNTER — Other Ambulatory Visit: Payer: Self-pay | Admitting: Licensed Clinical Social Worker

## 2018-08-17 ENCOUNTER — Telehealth: Payer: Self-pay | Admitting: Cardiology

## 2018-08-17 NOTE — Telephone Encounter (Signed)
Scott Forrest Clinical Social worker w/ [facility] left voice mail requesting to speak w/ a nurse regarding the pt's symptoms and weight loss.   Please give Theadore Nan a call @ [phone]

## 2018-08-17 NOTE — Patient Outreach (Signed)
Assessment:  CSW spoke via phone with client. CSW verified client identity. CSW received verbal permission from client for CSW to speak with client about client needs.Client has mobility issues and he continues to use a cane or a walker to help him with ambulation.  Client has a  Medical alert necklace to use as needed for emergency support.  CSW talked with client about client care plan. CSW encouraged Robin to continue to communicate with CSW in the next 30 days to talk about transportation resources for client in the area. CSW has talked with Yang about his use of [place] to help with client transport needs.  CSW has talked with Toula Moos about scheduling process for client with that transport agency. CSW talked also with client about [employer] transport agency in the area. Rosealee Albee, Nurse Practitioner, provides medical care for client. CSW has encouraged client to talk with Rosealee Albee about depression symptoms of client. Client said he is taking a medication as prescribed for depression symptoms.  CSW has talked with client about family issues for client and about stress issues for client. Client said he had been having nausea and diarrhea for several days and was weak and had trouble standing. CSW encouraged client to call nurse at practice of Rosealee Albee, Nurse Practitioner, to alert practice of client symptoms. CSW called practice of Dr. Harl Bowie, cardiologist, to also alert nurse at practice of Dr. Harl Bowie of  symptoms of client.  CSW talked with nurse at practice of Dr. Harl Bowie regarding client symptoms on 08/17/18.  CSW has encouraged client to call CSW as needed at [phone] to discuss social work needs of client. CSW has encouraged client to use local transport support agencies as needed to assist with client transport needs. CSW has encouraged client to attend scheduled client medical appointments.  Client was appreciative of call from [place] on 08/17/18.     Plan:  Client to continue to talk with CSW in next 30 days to talk about transportation resources for client in the area.   CSW to call client in 4 weeks to assess client needs.  Norva Riffle.Declan Mier MSW, LCSW Licensed Clinical Social Worker [facility] Care Management [phone]

## 2018-08-18 NOTE — Telephone Encounter (Signed)
Returned call to [employer] (Education officer, museum) - he was concerned about the symptoms patient is having.  Stated that he did try to contact his pmd [facility]), but they are closed till December & opening in a new location.  Patient c/o nasea, diarrhea, weight loss x several days.   Call placed to patient - stated that he been sick now for about 8 days.  Nausea/vomiting, diarrhea x 1.  Not able to keep anything down.  Is drinking minimal water & warm tea only.  Stated that his weight was 136lb this morning.  Weight on 10/24 was 147lb.  Does c/o weakness / fatigue.  SOB when he gets up to move around.  Speech does seem slurred at times.  Stated that happens off/on.  Did not know whether or not he had any fever.  Did also c/o stomach pain.    Suggested to patient that he needed to be evaluated in the ED or urgent care due to the length of time, weight loss, etc...  Stated that his wife will be coming tomorrow to check on him.    Call placed to wife [facility]) to see if she can come today.  Left message to return call.

## 2018-08-19 ENCOUNTER — Other Ambulatory Visit: Payer: Self-pay

## 2018-08-19 NOTE — Patient Outreach (Signed)
[facility]) Care Management  08/19/2018  Ronnie [name] Jul 09, 1946 [id number]    1st unsuccessful attempt to the patient for initial assessment. No answer. HIPAA compliant voicemail left with contact information.   Plan: [facility] will make outreach attempt the patient within in one month.  Lazaro Arms RN, BSN, [place] Direct Dial:  [phone]  Fax: [phone]

## 2018-08-20 ENCOUNTER — Encounter: Payer: Self-pay | Admitting: *Deleted

## 2018-08-20 NOTE — Telephone Encounter (Signed)
Patient is currently admitted at [facility].

## 2018-09-17 ENCOUNTER — Other Ambulatory Visit: Payer: Self-pay

## 2018-09-17 NOTE — Patient Outreach (Signed)
[facility]) Care Management  09/17/2018  Ronnie [name] 21-Sep-1946 [id number]    RN Health Coach call the patient for initial assessment. HIPAA verified.  The patient states that he is currently in a nursing home.  He passed out in his yard and was taken to [facility]. He states that he is in [facility].  Plan: RN Health Coach will close the case at this time.  I will send a referral for social work to follow the patient.    Lazaro Arms RN, BSN, [place] Direct Dial:  [phone]  Fax: [phone]

## 2018-09-18 ENCOUNTER — Other Ambulatory Visit: Payer: Self-pay | Admitting: Licensed Clinical Social Worker

## 2018-09-18 NOTE — Patient Outreach (Signed)
Assessment:  Ronnie spoke via phone with client. Ronnie verified client identity and received verbal permission from client for Ronnie to speak with client about client needs. Client uses a Medical Alert necklace as needed. He continues to have some mobility challenges and uses a cane or walker to ambulate.  Ronnie has talked several times with Ronnie [name] about available transportation resources for client in the area. Ronnie has encouraged Ronnie [name] to utilize [place] to help with client transport needs.Ronnie has also talked with Ronnie [name] about [employer] transport agency support.  Client sees Ronnie [name], Nurse Practitioner, as medical provider.  Ronnie has also encouraged Ronnie [name] to talk with Ronnie [name] about depression symptoms of client. Client has prescribed medications and is taking medications as prescribed. Ronnie has also talked with Ronnie [name] previously about family dynamics for client and stress issues for client related to family dynamics.  Ronnie Ronnie [name] informed Ronnie [name] on 09/18/18 that Ronnie [name] was transferring Ronnie care and support for client on 09/18/18 to Ronnie [name]. Client agreed to this plan. Client enjoys [facility] support.  He said he would be willing for Ronnie [name] to call him to talk about current client needs.  Ronnie [name] thanked Ronnie [name] for working with Ronnie [name] in recent months related to Ronnie [name] support for client.   Plan:  Ronnie [name] is transferring Ronnie care and support for Ronnie [name] to [employer] [name] on 09/18/18.  Client agreed to receive Ronnie support with Ronnie [name].  Ronnie [name] to call client in 4 weeks to assess client needs at that time.  Ronnie [name].Kasondra Junod MSW, [name] Licensed Clinical Social Worker [facility] Care Management [phone]

## 2018-09-28 ENCOUNTER — Other Ambulatory Visit: Payer: Self-pay | Admitting: Cardiology

## 2018-10-19 ENCOUNTER — Other Ambulatory Visit: Payer: Self-pay | Admitting: *Deleted

## 2018-10-19 NOTE — Patient Outreach (Signed)
[facility]) Care Management  10/19/2018  WALLIS VANCOTT 02/01/1946 [id number]   CSW called & spoke with patient as case was transferred from [place], [place] spoke with patient about discharge from [facility], patient reports that he has been home for almost 2 weeks now and has been doing well. Patient states that [place] is wrapping up with him and would be appreciative of [place] referral for transition of care. Patient was previously active with [facility], Lazaro Arms prior to SNF stay. Patient states that he has 2 appointments coming up in March to his cardiologist, Dr. Harl Bowie in [place] and Dr. Sherrie Sport - internist in [place] as well. CSW will check in about Medicaid transportation and whether it would be covered. CSW will make referral to [place] for transition of care & will follow-up with patient in 1 month.    [name], LCSW [facility]  Clinical Social Worker cell #: [phone]

## 2018-10-20 ENCOUNTER — Encounter: Payer: Self-pay | Admitting: *Deleted

## 2018-10-20 ENCOUNTER — Other Ambulatory Visit: Payer: Self-pay | Admitting: *Deleted

## 2018-10-20 NOTE — Patient Outreach (Signed)
Referral received from [place] for transition of care, pt discharged [facility] 3 1/2 weeks ago on 09/25/18, pt has history of falls related to vertigo, CHF, HTN, dysphagia, malnutrition, RN CM spoke with pt, HIPAA verified, pt reports he lives alone and his wife does not live with him but checks in on him, assists with transportation as well as his brother, pt saw primary care MD on 10/01/18, reports does weigh daily with weight today 132 pounds, [place] and PT active with pt currently but will be discharging in the near future.  Pt states he is eating better and appetite varies day to day.  Pt states he wishes he had "more help in the home"  Pt does have medicaid, RN CM sent in basket to [place] regarding PCS services.  Pt agreeable to participation in transition of care program with weekly outreach.  RN CM routed today's note to primary care MD Dr. Sherrie Sport.  THN CM Care Plan Problem One     Most Recent Value  Care Plan Problem One  Knowledge deficit related to CHF  Role Documenting the Problem One  Care Management Coordinator  Care Plan for Problem One  Active  THN Long Term Goal   Pt will demonstrate improved self care for CHF within 60 days  THN Long Term Goal Start Date  10/20/18  Interventions for Problem One Long Term Goal  RN CM reviewed importance of continued daily weights as pt is already doing this, reviewed [facility] services and transition of care program  [facility] CM Short Term Goal #1   Pt will verbalize CHF action plan/ zones within 30 days  THN CM Short Term Goal #1 Start Date  10/20/18  Interventions for Short Term Goal #1  Pt was previously given copy of HF action plan, RN CM reviewed action plan over the phone and importance of calling MD early on for change in health status/ symptoms    THN CM Care Plan Problem Two     Most Recent Value  Care Plan Problem Two  High risk for falls related to vertigo  Role Documenting the Problem Two  Care Management Coordinator  Care Plan  for Problem Two  Active  THN CM Short Term Goal #1   Pt will utilize safety precautions and use assistive device/ ask for assistance within 30 days  THN CM Short Term Goal #1 Start Date  10/20/18  Interventions for Short Term Goal #2   RN CM reviewed safety precautions, importance of using DME, asking for assistance as needed, pt could benefit from additional assistance in the home through medicaid benefit, RNCM sent in basket to [place] asking if pt may be eligible for PCS services in the home      PLAN Continue weekly transition of care calls Collaborate with [facility] CSW as needed  [name] [facility], [place] Coordinator [phone]

## 2018-10-26 ENCOUNTER — Other Ambulatory Visit: Payer: Self-pay | Admitting: *Deleted

## 2018-10-26 NOTE — Patient Outreach (Signed)
Outreach phone call to pt for transition of care week 2, spoke with pt, HIPAA verified, pt reports he continues to weigh daily with weight yesterday 135 pounds, pt has not weighed today as he is sleeping late,  Pt reports dyspnea with exertion "that is about the same, no change"  Pt reports appetite "about the same"  Patient's wife (does not live with pt) continues to assist pt as needed, pt reports home health discharged and pt has list of prescribed exercises and is trying to do these several times weekly.    THN CM Care Plan Problem One     Most Recent Value  Care Plan Problem One  Knowledge deficit related to CHF  Role Documenting the Problem One  Care Management Coordinator  Care Plan for Problem One  Active  THN Long Term Goal   Pt will demonstrate improved self care for CHF within 60 days  THN Long Term Goal Start Date  10/20/18  Interventions for Problem One Long Term Goal  RN CM reviewed plan of care with pt, pt states he has all medications except pantoprazole and he is to pickup today from pharmacy, RN CM reiterated importance of taking all medications as prescribed  THN CM Short Term Goal #1   Pt will verbalize CHF action plan/ zones within 30 days  THN CM Short Term Goal #1 Start Date  10/20/18  Interventions for Short Term Goal #1  RN CM reinforced HF action plan/ zones    [facility] CM Care Plan Problem Two     Most Recent Value  Care Plan Problem Two  High risk for falls related to vertigo  Role Documenting the Problem Two  Care Management [place] for Problem Two  Active  THN CM Short Term Goal #1   Pt will utilize safety precautions and use assistive device/ ask for assistance within 30 days  THN CM Short Term Goal #1 Start Date  10/20/18  Interventions for Short Term Goal #2   Pt denies any falls since last conversation with RN CM ,  reviewed safety precautions      PLAN Continue weekly transition of care calls  [name] [facility], [place]  Coordinator [phone]

## 2018-11-02 ENCOUNTER — Other Ambulatory Visit: Payer: Self-pay | Admitting: *Deleted

## 2018-11-02 NOTE — Patient Outreach (Signed)
Outreach telephone call to pt for transition of care week 3, spoke with pt, HIPAA verified, pt reports he is doing well today, sleepy and in bed, has not weighed today but has been continuing daily weights, no new concerns voiced.  THN CM Care Plan Problem One     Most Recent Value  Care Plan Problem One  Knowledge deficit related to CHF  Role Documenting the Problem One  Care Management Coordinator  Care Plan for Problem One  Active  THN Long Term Goal   Pt will demonstrate improved self care for CHF within 60 days  THN Long Term Goal Start Date  10/20/18  Interventions for Problem One Long Term Goal  RN CM reviewed plan of care with pt,  pt reports he did get pantoprazole from pharmacy and has all medications, taking as prescribed.  THN CM Short Term Goal #1   Pt will verbalize CHF action plan/ zones within 30 days  THN CM Short Term Goal #1 Start Date  10/20/18  Interventions for Short Term Goal #1  RN CM reviewed HF action plan, importnace of calling MD early for change in health status    [facility] CM Care Plan Problem Two     Most Recent Value  Care Plan Problem Two  High risk for falls related to vertigo  Role Documenting the Problem Two  Care Management [place] for Problem Two  Active  THN CM Short Term Goal #1   Pt will utilize safety precautions and use assistive device/ ask for assistance within 30 days  THN CM Short Term Goal #1 Start Date  10/20/18  Interventions for Short Term Goal #2   RN CM reinforced safety precautions      PLAN Continue weekly transition of care  [name] [facility], [place] Coordinator [phone]

## 2018-11-09 ENCOUNTER — Other Ambulatory Visit: Payer: Self-pay | Admitting: *Deleted

## 2018-11-09 NOTE — Patient Outreach (Signed)
Outreach call to pt for transition of care week 4, spoke with pt, HIPAA verified, pt reports he has all medications and taking as prescribed, appetite is better but still varies day to day, weight has increased and pt wants to gain weight, RN CM ask pt to be mindful of edema and to report to MD, Pt reports nurse from [place] continues to see him and PT has discharged, pt denies any falls.  THN CM Care Plan Problem One     Most Recent Value  Care Plan Problem One  Knowledge deficit related to CHF  Role Documenting the Problem One  Care Management Coordinator  Care Plan for Problem One  Active  THN Long Term Goal   Pt will demonstrate improved self care for CHF within 60 days  THN Long Term Goal Start Date  10/20/18  Interventions for Problem One Long Term Goal  RN CM reviewed/ update plan of care with pt with focus of CHF.  THN CM Short Term Goal #1   Pt will verbalize CHF action plan/ zones within 30 days  THN CM Short Term Goal #1 Start Date  11/09/18 [re-established]  Interventions for Short Term Goal #1  RN CM reinforced CHF action plan, placed emphasis on yellow zone.    THN CM Care Plan Problem Two     Most Recent Value  Care Plan Problem Two  High risk for falls related to vertigo  Role Documenting the Problem Two  Care Management Coordinator  Care Plan for Problem Two  Active  THN CM Short Term Goal #1   Pt will utilize safety precautions and use assistive device/ ask for assistance within 30 days  THN CM Short Term Goal #1 Start Date  11/09/18 [goal re-established]  Interventions for Short Term Goal #2   RN CM reviewed safety precautions, importance of using assistive device and asking for assistance as needed, pt states he can call his wife Malachy Mood if needed.      PLAN Outreach pt for telephonic assessment next month   [name] [facility], [place] Coordinator [phone]

## 2018-11-11 DIAGNOSIS — J449 Chronic obstructive pulmonary disease, unspecified: Secondary | ICD-10-CM | POA: Diagnosis not present

## 2018-11-11 DIAGNOSIS — E43 Unspecified severe protein-calorie malnutrition: Secondary | ICD-10-CM | POA: Diagnosis not present

## 2018-11-11 DIAGNOSIS — Z7982 Long term (current) use of aspirin: Secondary | ICD-10-CM | POA: Diagnosis not present

## 2018-11-11 DIAGNOSIS — K219 Gastro-esophageal reflux disease without esophagitis: Secondary | ICD-10-CM | POA: Diagnosis not present

## 2018-11-11 DIAGNOSIS — N184 Chronic kidney disease, stage 4 (severe): Secondary | ICD-10-CM | POA: Diagnosis not present

## 2018-11-11 DIAGNOSIS — I5020 Unspecified systolic (congestive) heart failure: Secondary | ICD-10-CM | POA: Diagnosis not present

## 2018-11-11 DIAGNOSIS — E785 Hyperlipidemia, unspecified: Secondary | ICD-10-CM | POA: Diagnosis not present

## 2018-11-11 DIAGNOSIS — Z9181 History of falling: Secondary | ICD-10-CM | POA: Diagnosis not present

## 2018-11-11 DIAGNOSIS — I130 Hypertensive heart and chronic kidney disease with heart failure and stage 1 through stage 4 chronic kidney disease, or unspecified chronic kidney disease: Secondary | ICD-10-CM | POA: Diagnosis not present

## 2018-11-16 DIAGNOSIS — N184 Chronic kidney disease, stage 4 (severe): Secondary | ICD-10-CM | POA: Diagnosis not present

## 2018-11-16 DIAGNOSIS — I5020 Unspecified systolic (congestive) heart failure: Secondary | ICD-10-CM | POA: Diagnosis not present

## 2018-11-16 DIAGNOSIS — E785 Hyperlipidemia, unspecified: Secondary | ICD-10-CM | POA: Diagnosis not present

## 2018-11-16 DIAGNOSIS — K219 Gastro-esophageal reflux disease without esophagitis: Secondary | ICD-10-CM | POA: Diagnosis not present

## 2018-11-16 DIAGNOSIS — Z9181 History of falling: Secondary | ICD-10-CM | POA: Diagnosis not present

## 2018-11-16 DIAGNOSIS — E43 Unspecified severe protein-calorie malnutrition: Secondary | ICD-10-CM | POA: Diagnosis not present

## 2018-11-16 DIAGNOSIS — I130 Hypertensive heart and chronic kidney disease with heart failure and stage 1 through stage 4 chronic kidney disease, or unspecified chronic kidney disease: Secondary | ICD-10-CM | POA: Diagnosis not present

## 2018-11-16 DIAGNOSIS — J449 Chronic obstructive pulmonary disease, unspecified: Secondary | ICD-10-CM | POA: Diagnosis not present

## 2018-11-16 DIAGNOSIS — Z7982 Long term (current) use of aspirin: Secondary | ICD-10-CM | POA: Diagnosis not present

## 2018-11-19 ENCOUNTER — Ambulatory Visit: Payer: Self-pay | Admitting: *Deleted

## 2018-11-25 DIAGNOSIS — E785 Hyperlipidemia, unspecified: Secondary | ICD-10-CM | POA: Diagnosis not present

## 2018-11-25 DIAGNOSIS — Z9181 History of falling: Secondary | ICD-10-CM | POA: Diagnosis not present

## 2018-11-25 DIAGNOSIS — N184 Chronic kidney disease, stage 4 (severe): Secondary | ICD-10-CM | POA: Diagnosis not present

## 2018-11-25 DIAGNOSIS — I5020 Unspecified systolic (congestive) heart failure: Secondary | ICD-10-CM | POA: Diagnosis not present

## 2018-11-25 DIAGNOSIS — K219 Gastro-esophageal reflux disease without esophagitis: Secondary | ICD-10-CM | POA: Diagnosis not present

## 2018-11-25 DIAGNOSIS — J449 Chronic obstructive pulmonary disease, unspecified: Secondary | ICD-10-CM | POA: Diagnosis not present

## 2018-11-25 DIAGNOSIS — E43 Unspecified severe protein-calorie malnutrition: Secondary | ICD-10-CM | POA: Diagnosis not present

## 2018-11-25 DIAGNOSIS — Z7982 Long term (current) use of aspirin: Secondary | ICD-10-CM | POA: Diagnosis not present

## 2018-11-25 DIAGNOSIS — I130 Hypertensive heart and chronic kidney disease with heart failure and stage 1 through stage 4 chronic kidney disease, or unspecified chronic kidney disease: Secondary | ICD-10-CM | POA: Diagnosis not present

## 2018-11-27 ENCOUNTER — Other Ambulatory Visit: Payer: Self-pay | Admitting: *Deleted

## 2018-11-27 NOTE — Patient Outreach (Signed)
[facility]) Care Management  11/27/2018  Ronnie [name] September 12, 1946 [id number]   CSW called & spoke with patient to follow-up on community resources. CSW provided information to patient on [place] ([facility]) and encouraged patient to call to setup transportation for his upcoming medical appointments in March, patient reassured that with Medicaid - medical transportation would be covered. Patient states that he had heard of [facility] but never contacted them before but plans to do so.   Patient also spoke with CSW about potential plans to move to another apartment complex closer to his brother. Patient reports that he already has the application. Patient declined need for CSW assistance with housing at this time.   CSW will follow-up with patient in 3 weeks to ensure [facility] has been contacted to setup transportation for appointments & follow-up on housing.    [name], LCSW [facility]  Clinical Social Worker cell #: [phone]

## 2018-12-02 ENCOUNTER — Other Ambulatory Visit: Payer: Self-pay | Admitting: Cardiology

## 2018-12-02 DIAGNOSIS — I130 Hypertensive heart and chronic kidney disease with heart failure and stage 1 through stage 4 chronic kidney disease, or unspecified chronic kidney disease: Secondary | ICD-10-CM | POA: Diagnosis not present

## 2018-12-02 DIAGNOSIS — E785 Hyperlipidemia, unspecified: Secondary | ICD-10-CM | POA: Diagnosis not present

## 2018-12-02 DIAGNOSIS — Z9181 History of falling: Secondary | ICD-10-CM | POA: Diagnosis not present

## 2018-12-02 DIAGNOSIS — N184 Chronic kidney disease, stage 4 (severe): Secondary | ICD-10-CM | POA: Diagnosis not present

## 2018-12-02 DIAGNOSIS — Z7982 Long term (current) use of aspirin: Secondary | ICD-10-CM | POA: Diagnosis not present

## 2018-12-02 DIAGNOSIS — E43 Unspecified severe protein-calorie malnutrition: Secondary | ICD-10-CM | POA: Diagnosis not present

## 2018-12-02 DIAGNOSIS — I5020 Unspecified systolic (congestive) heart failure: Secondary | ICD-10-CM | POA: Diagnosis not present

## 2018-12-02 DIAGNOSIS — J449 Chronic obstructive pulmonary disease, unspecified: Secondary | ICD-10-CM | POA: Diagnosis not present

## 2018-12-02 DIAGNOSIS — K219 Gastro-esophageal reflux disease without esophagitis: Secondary | ICD-10-CM | POA: Diagnosis not present

## 2018-12-02 MED ORDER — CARVEDILOL 25 MG PO TABS: 25.0000 mg | ORAL_TABLET | Freq: Two times a day (BID) | ORAL | 0 refills | Status: DC

## 2018-12-02 NOTE — Telephone Encounter (Signed)
Medication sent to pharmacy  

## 2018-12-02 NOTE — Telephone Encounter (Signed)
Refill requested for COREG) 25 MG tablet  [facility] [place], [place]

## 2018-12-07 ENCOUNTER — Other Ambulatory Visit: Payer: Self-pay | Admitting: *Deleted

## 2018-12-07 ENCOUNTER — Encounter: Payer: Self-pay | Admitting: *Deleted

## 2018-12-07 DIAGNOSIS — I509 Heart failure, unspecified: Secondary | ICD-10-CM

## 2018-12-07 NOTE — Patient Outreach (Signed)
Outreach call to pt for telephone assessment, spoke with pt, HIPAA verified, pt reports " I'm doing fairly well" states " I'm out of my potassium, there was no refills and they're supposed to be filling" and to start prefilled medication packs later this month.  Pt reports home health discharged,  appetite " varies day to day" , denies any falls, RN CM discussed discharge plan and pt agreeable to transfer to RN health coach.    THN CM Care Plan Problem One     Most Recent Value  Care Plan Problem One  Knowledge deficit related to CHF  Role Documenting the Problem One  Care Management Coordinator  Care Plan for Problem One  Active  THN Long Term Goal   Pt will demonstrate improved self care for CHF within 60 days  THN Long Term Goal Start Date  12/07/18 Ronnie [name] re-established]  Interventions for Problem One Long Term Goal  RN CM reviewed plan of care with pt, discussed discharge plan, [facility], potassium is ready for pickup, RN CM informed pt and he will get medication from pharmacy  [facility] CM Short Term Goal #1   Pt will verbalize CHF action plan/ zones within 30 days  THN CM Short Term Goal #1 Start Date  11/09/18 [re-established]  Interventions for Short Term Goal #1  RN CM reviewed HF action plan/ zones with importance of callling MD early for change in health status    THN CM Care Plan Problem Two     Most Recent Value  Care Plan Problem Two  High risk for falls related to vertigo  Role Documenting the Problem Two  Care Management [place] for Problem Two  Active  THN CM Short Term Goal #1   Pt will utilize safety precautions and use assistive device/ ask for assistance within 30 days  THN CM Short Term Goal #1 Start Date  11/09/18 [goal re-established]  THN CM Short Term Goal #1 Met Date   12/07/18  Interventions for Short Term Goal #2   RN CM reinforced safety precautions, pt states he has had no falls and is using walker      PLAN Transfer to RN  health coach Continue reinforcement with CHF action plan  Ronnie [name] [facility], [place] Coordinator [phone]

## 2018-12-08 ENCOUNTER — Other Ambulatory Visit: Payer: Self-pay

## 2018-12-18 ENCOUNTER — Other Ambulatory Visit: Payer: Self-pay | Admitting: *Deleted

## 2018-12-21 NOTE — Patient Outreach (Signed)
[place] [facility]) Care Management  12/21/2018  Ronnie [name] Dec 05, 1945 [id number]   CSW called to follow-up with patient regarding transportation. Patient confirms that he has called [facility] and scheduled his 2 upcoming appointments (3/26 with Dr. Freida Busman & 3/30 with Dr. Blanch Media podiatrist) and that he provided [facility] with his Medicaid number and does not anticipate that he will be charged. Patient states that his [place], Marjorie Smolder has called to get his name on the Meals on Wheels waitlist but that he is 30th in line but is ok with waiting as his brother and son help him out with errands. Patient states no further CSW needs identified at this time. CSW closing case but encouraged patient to call if any needs came up.    [name], LCSW [facility]  Clinical Social Worker cell #: [phone]

## 2018-12-25 IMAGING — US US ABDOMEN LIMITED
1 series · 14 of 25 positions shown · non-contrast
Comparison: CT 11/16/2017, HIDA scan 10/30/2017, ultrasound
10/30/2017

CLINICAL DATA: Chronic right upper quadrant pain

EXAM:
ULTRASOUND ABDOMEN LIMITED RIGHT UPPER QUADRANT

[Series 1: us abdomen limited · 0.15mm/px · 14 of 55 slices shown]
[im 1/55]
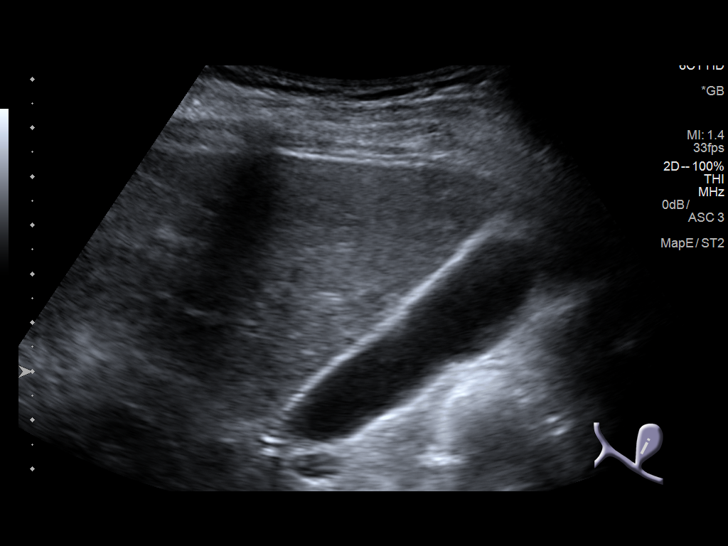
[im 5/55]
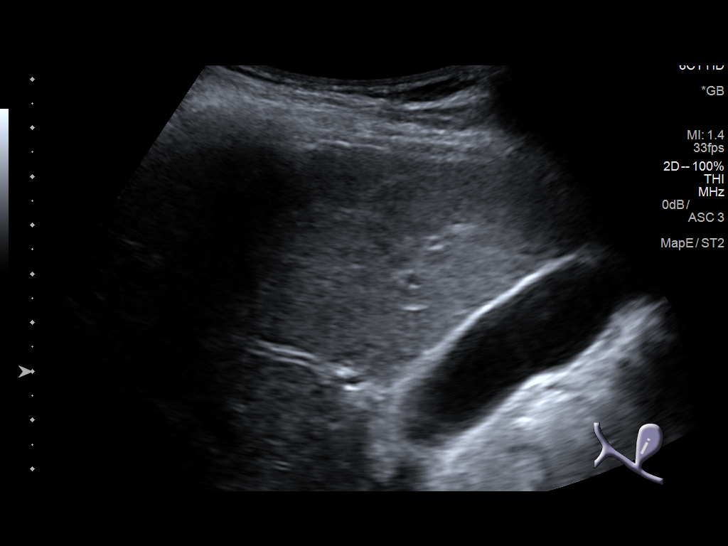
[im 10/55]
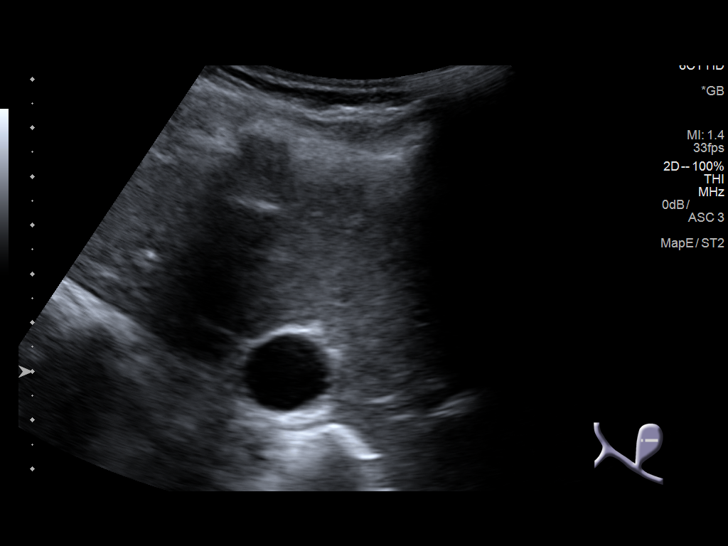
[im 14/55]
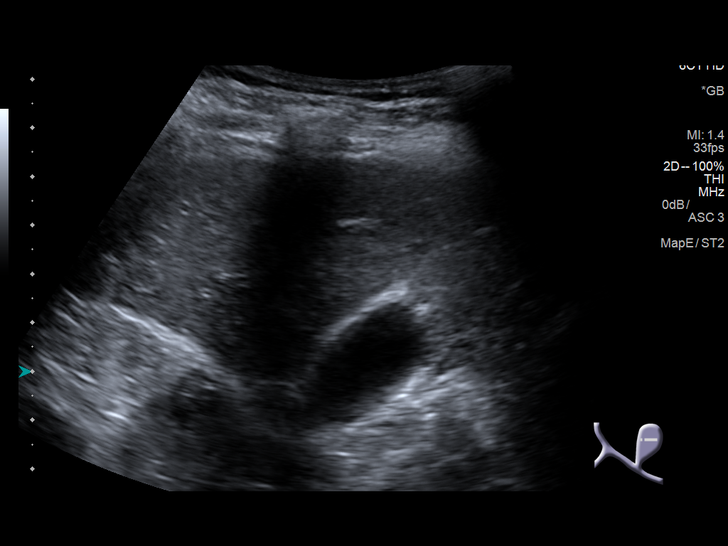
[im 19/55]
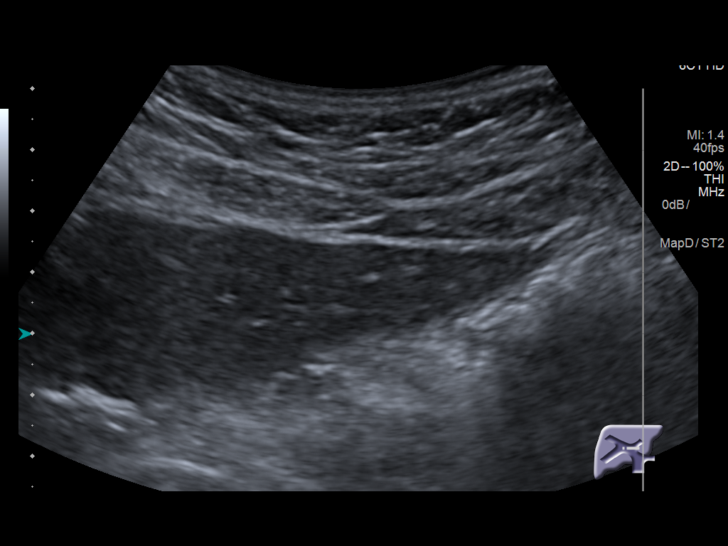
[im 21/55]
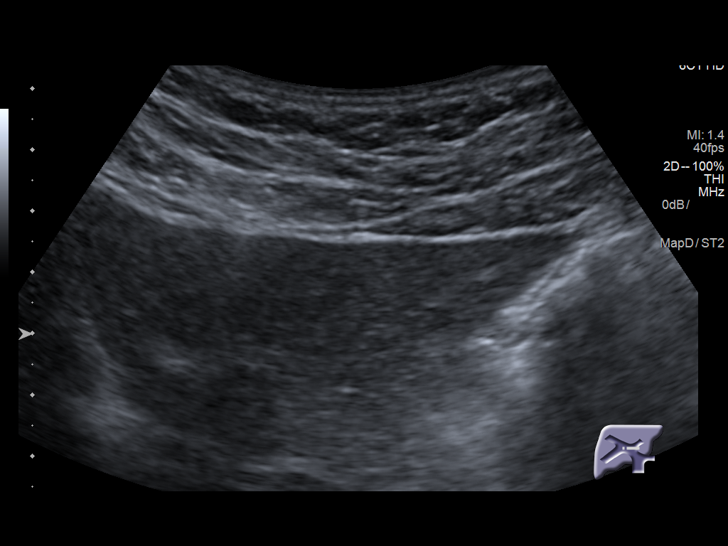
[im 25/55]
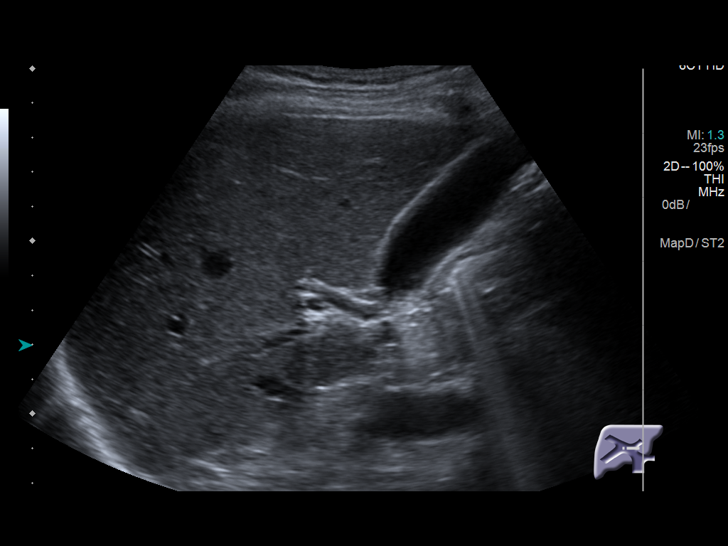
[im 30/55]
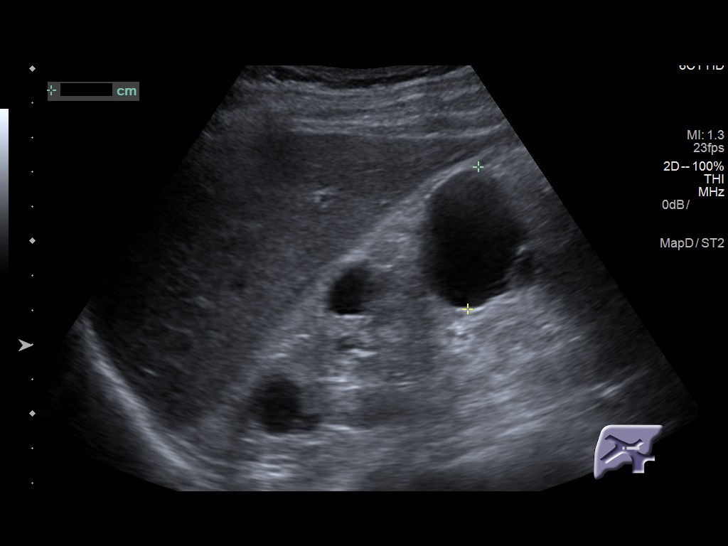
[im 34/55]
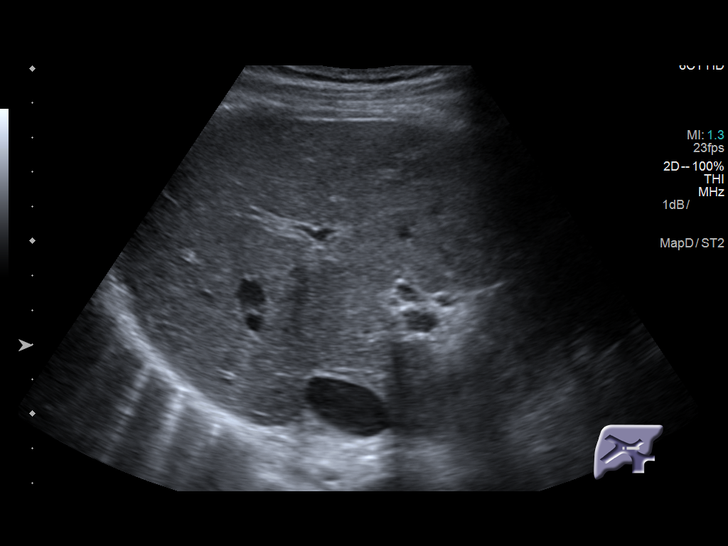
[im 37/55]
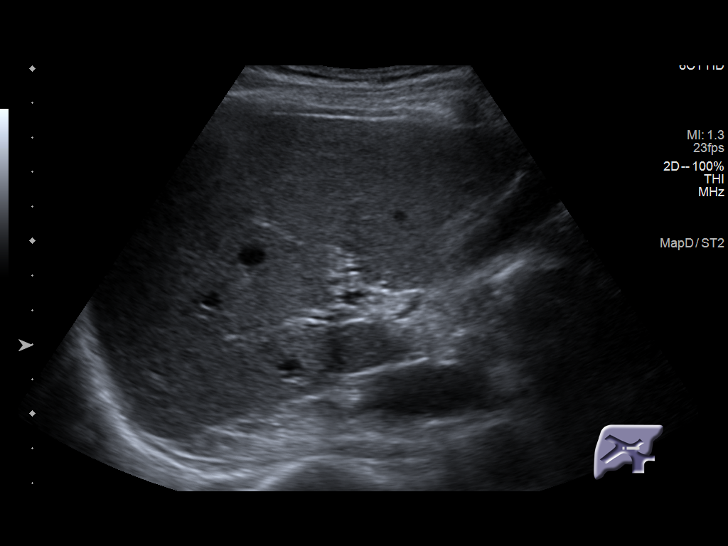
[im 41/55]
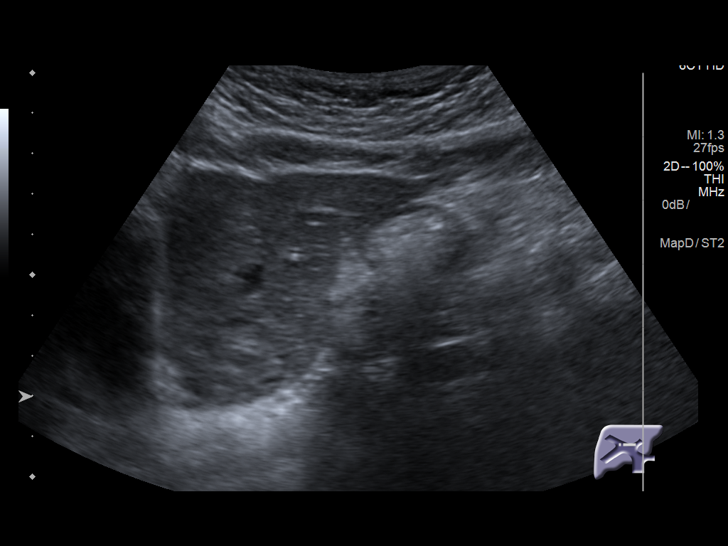
[im 46/55]
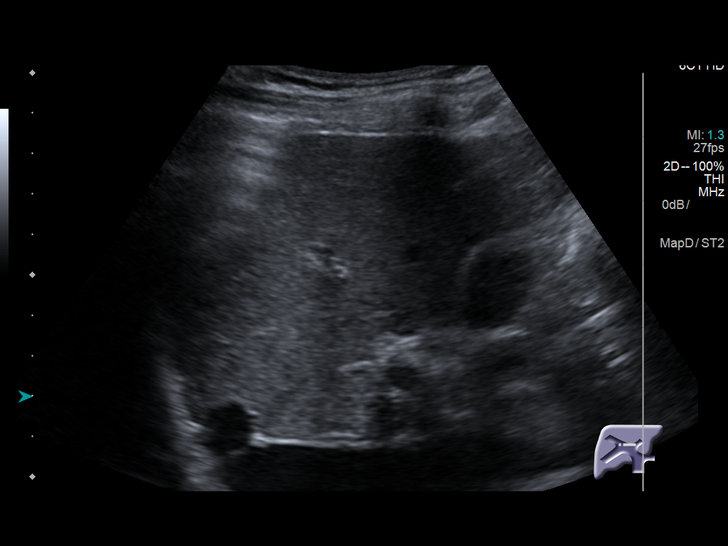
[im 50/55]
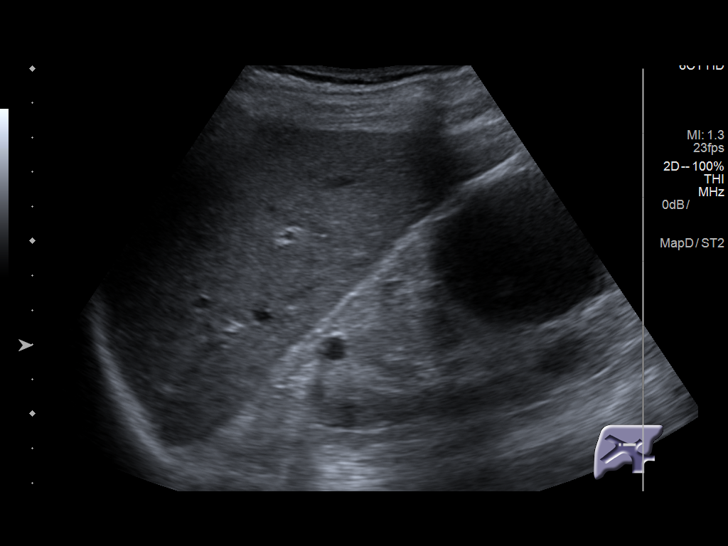
[im 55/55]
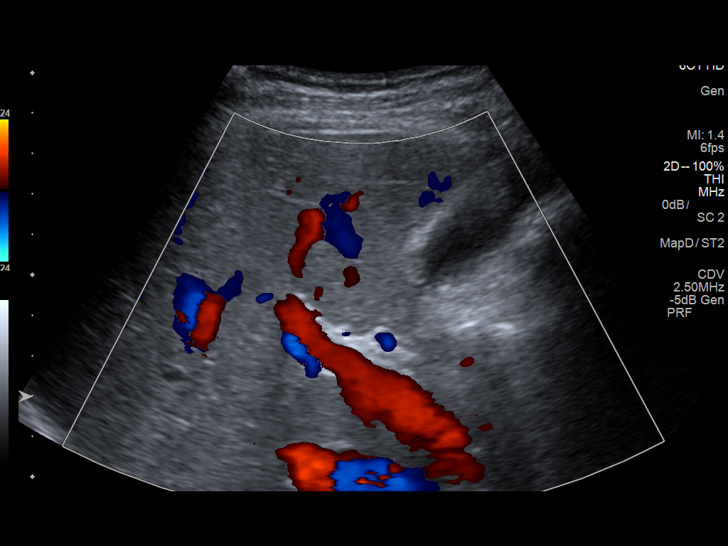

[14 of 25 positions shown; findings below may reference images not displayed]

FINDINGS: Gallbladder:

No gallstones or wall thickening visualized. No sonographic Murphy
sign noted by sonographer.

Common bile duct:

Diameter: 4 mm

Liver:

No focal lesion identified. Within normal limits in parenchymal
echogenicity. Portal vein is patent on color Doppler imaging with
normal direction of blood flow towards the liver.

Incidental note made of multiple right renal cysts. Right kidney
appears echogenic. Cysts measure up to 4.1 cm.
IMPRESSION: 1. Negative for gallbladder stones or biliary dilatation
2. Echogenic right kidney suggests medical renal disease. There are
multiple cysts in the right kidney.

## 2018-12-31 DIAGNOSIS — Z1389 Encounter for screening for other disorder: Secondary | ICD-10-CM | POA: Diagnosis not present

## 2018-12-31 DIAGNOSIS — Z0000 Encounter for general adult medical examination without abnormal findings: Secondary | ICD-10-CM | POA: Diagnosis not present

## 2018-12-31 DIAGNOSIS — N189 Chronic kidney disease, unspecified: Secondary | ICD-10-CM | POA: Diagnosis not present

## 2018-12-31 DIAGNOSIS — I5042 Chronic combined systolic (congestive) and diastolic (congestive) heart failure: Secondary | ICD-10-CM | POA: Diagnosis not present

## 2018-12-31 DIAGNOSIS — I10 Essential (primary) hypertension: Secondary | ICD-10-CM | POA: Diagnosis not present

## 2019-01-04 DIAGNOSIS — M79672 Pain in left foot: Secondary | ICD-10-CM | POA: Diagnosis not present

## 2019-01-04 DIAGNOSIS — L110 Acquired keratosis follicularis: Secondary | ICD-10-CM | POA: Diagnosis not present

## 2019-01-04 DIAGNOSIS — M79671 Pain in right foot: Secondary | ICD-10-CM | POA: Diagnosis not present

## 2019-01-04 DIAGNOSIS — I739 Peripheral vascular disease, unspecified: Secondary | ICD-10-CM | POA: Diagnosis not present

## 2019-01-05 ENCOUNTER — Other Ambulatory Visit: Payer: Self-pay

## 2019-01-05 NOTE — Patient Outreach (Signed)
[place] [facility]) Care Management  01/05/2019   Ronnie [name] [birth year] [id number]   Outreach attempt # 1 to the patient for initial assessment. HIPAA verified.  The patient was able to answer and complete the assessment  Social: The patient lives in the home alone. He is separated from his wife but she still comes to  help him. He states that he has limited support.  He states that he can perform his ADLS/IADLS independent/assist.  He states that he has transportation to his appointments. He states that today he is not having any pain.  He has had 2-3 falls last year but none so far this year.  He states that he does walk with his cane or walker but he becomes dizzy and has passed out before. Discussed with the patient about fall precautions.  He verbalized understanding.  The durable medical equipment he has in the home consist of:  Reading glasses, nebulizer, cane, walker, shower chair and scales.   Conditions: Per chart review and conversation with the client his conditions include: CHF, HTN, CKD stage III, Hyperlipidemia, Anemia, Malnutrition of moderate degree and Depression. I am coaching the patient for his CHF.  He states that he gets short of breath when talking and walking.  He states that he weighs himself daily.  He does not use any type of salt in his diet. The patient states that he is depressed.  His PHQ9-9 score was 8.  He states that he is lonely and rarely talks with people.  He states that he is sad because he is not able to do what he did before.  He is a Therapist, nutritional and played the piano.  He has medications prescribed for him.  Medications:Per review with the patient he is on sixteen medications. He did not express any concern for paying for his medications.  He states that he is able to manage his medications.   Appointments: The patient states at the time he is unsure when his next appointment will be.   Advance Directives: Patient states that he has an  advance directive that was done when he was in the hospital in December. He states that it was a healthcare power of attorney.  Current Medications:  Current Outpatient Medications  Medication Sig Dispense Refill  . albuterol (PROVENTIL HFA;VENTOLIN HFA) 108 (90 BASE) MCG/ACT inhaler Inhale 2 puffs into the lungs 2 (two) times daily as needed for wheezing or shortness of breath.    Marland Kitchen aspirin EC 81 MG EC tablet Take 1 tablet (81 mg total) by mouth daily.    . carvedilol (COREG) 25 MG tablet Take 1 tablet (25 mg total) by mouth 2 (two) times daily. 180 tablet 0  . citalopram (CELEXA) 10 MG tablet Take 1 tablet (10 mg total) by mouth daily. 30 tablet 0  . escitalopram (LEXAPRO) 20 MG tablet Take 20 mg by mouth daily.    . fluticasone (FLONASE) 50 MCG/ACT nasal spray Place 1 spray into both nostrils daily as needed for allergies or rhinitis.    . furosemide (LASIX) 20 MG tablet TAKE 1 TABLET AS NEEDED FOR SWELLING 90 tablet 1  . hydrALAZINE (APRESOLINE) 25 MG tablet Take 1 tablet (25 mg total) by mouth every 8 (eight) hours. 90 tablet 0  . ipratropium-albuterol (DUONEB) 0.5-2.5 (3) MG/3ML SOLN Take 3 mLs by nebulization every 6 (six) hours as needed (shortness of breath).    . isosorbide mononitrate (IMDUR) 30 MG 24 hr tablet Take 30 mg by mouth daily.  0  . Multiple Vitamin (MULTIVITAMIN WITH MINERALS) TABS tablet Take 1 tablet by mouth daily.    . pantoprazole (PROTONIX) 40 MG tablet Take 40 mg by mouth daily.    . potassium chloride SA (K-DUR,KLOR-CON) 20 MEQ tablet Take 20 mEq by mouth 2 (two) times daily.    . simvastatin (ZOCOR) 20 MG tablet Take 40 mg by mouth every evening.    . torsemide (DEMADEX) 20 MG tablet Take 20 mg by mouth 2 (two) times daily.    Marland Kitchen lisinopril (PRINIVIL,ZESTRIL) 10 MG tablet TAKE 1 TABLET BY MOUTH EVERY DAY (Patient not taking: Reported on 10/20/2018) 90 tablet 1  . meclizine (ANTIVERT) 25 MG tablet Take 25 mg by mouth 3 (three) times daily as needed for dizziness.     . ondansetron (ZOFRAN) 4 MG tablet Take 1 tablet (4 mg total) by mouth every 6 (six) hours as needed for nausea. (Patient not taking: Reported on 10/20/2018) 20 tablet 0   No current facility-administered medications for this visit.     Functional Status:  In your present state of health, do you have any difficulty performing the following activities: 01/05/2019 05/20/2018  Hearing? N N  Vision? N N  Difficulty concentrating or making decisions? Y N  Walking or climbing stairs? Y N  Dressing or bathing? Y N  Doing errands, shopping? Y Y  Conservation officer, nature and eating ? - N  Using the Toilet? - N  In the past six months, have you accidently leaked urine? - N  Do you have problems with loss of bowel control? - N  Managing your Medications? - N  Managing your Finances? - N  Housekeeping or managing your Housekeeping? - Y  Some recent data might be hidden    Fall/Depression Screening: Fall Risk  01/05/2019 10/20/2018 05/27/2018  Falls in the past year? 1 1 Yes  Number falls in past yr: 1 1 2  or more  Injury with Fall? 1 1 No  Comment Bottom tooth went into upper gum - -  Risk Factor Category  - - High Fall Risk  Risk for fall due to : Other (Comment) History of fall(s);Medication side effect History of fall(s);Impaired balance/gait;Medication side effect  Risk for fall due to: Comment patient passed out - -  Follow up - Falls evaluation completed Falls prevention discussed;Falls evaluation completed  Comment - pt has PT with [facility] -   [facility] 2/9 Scores 01/05/2019 05/27/2018 05/20/2018 05/18/2018  PHQ - 2 Score 3 3 2 2   PHQ- 9 Score 8 8 7  -    Assessment: Patient will benefit from health coach outreach for disease management and support.   THN CM Care Plan Problem One     Most Recent Value  Care Plan Problem One  Knowledge deficit related to diease management  Role Documenting the Problem One  [place] for Problem One  Active  THN Long Term Goal   In 30 days the  patient will be able to verbalize 2 symptoms in the red zone.  THN Long Term Goal Start Date  01/05/19  Interventions for Problem One Long Term Goal  Reviewed signs and symptoms of heart failure, discussed with patient chf action plan, encouraged patient to weigh daily, reviewed medications encouraged medication compliance     Plan: [place] will provide ongoing education for patient on heart failure through phone calls and sending printed information to patient for further discussion.  RN Health Coach will send printed information  on heart failure.  RN Health Coach will send initial barriers letter, assessment, and care plan to primary care physician. RN Health Coach will contact patient in the month of April and patient agrees to next outreach.   Lazaro Arms RN, BSN, [place] Direct Dial:  [phone]  Fax: [phone]

## 2019-01-13 ENCOUNTER — Other Ambulatory Visit: Payer: Self-pay

## 2019-01-13 NOTE — Patient Outreach (Signed)
[facility] [facility]) Care Management  01/13/2019  RASHOD GOUGEON 1946-03-04 [id number]  Request received from Assistant Clinical Director of Care Management, Bary Castilla, to contact patient about food resources.   Successful outreach today.  Patient reports being on the wait list for Meals on Wheels but is not certain of status  BSW agreed to contact Bonnetta Barry with [place] program regarding this.  In the meantime, BSW agreed to link patient to [place] outreach program which will allow for seven meals to be delivered to patient each week for the next four weeks.   Secure email sent to Bonnetta Barry regarding status of MOW. Referral submitted to [place] outreach program.   Will follow up with patient once confirmation of meal delivery is received and response is received from MOW.  [name], BSW Social Worker [phone]

## 2019-01-14 ENCOUNTER — Other Ambulatory Visit: Payer: Self-pay

## 2019-01-14 NOTE — Patient Outreach (Signed)
[facility]) Care Management  01/14/2019  Ronnie [name] Feb 20, 1946 [id number]   Outreach to patient to inform him that meal delivery from [facility] will begin on Friday, 01/14/19.  Patient will receive seven days worth of meals for the next four Fridays.  BSW encouraged patient to remove food from delivery containers, place in personal containers, discard delivery containers, and wash hands well.  BSW will follow up next week to ensure receipt of first delivery.  [name], BSW Social Worker [phone]

## 2019-01-18 ENCOUNTER — Other Ambulatory Visit: Payer: Self-pay

## 2019-01-18 ENCOUNTER — Other Ambulatory Visit: Payer: Self-pay | Admitting: Pharmacist

## 2019-01-18 NOTE — Patient Outreach (Signed)
[place] [facility]) Care Management  [name]   01/18/2019  Ronnie [name] 03-06-1946 [id number]  Reason for referral: Medication Assistance with getting set-up will pill packaging  Referral source: patient satisfaction survey Current insurance: [employer]  Outreach:  Successful telephone call with patient.  HIPAA identifiers verified.   Subjective:   Patient states he has been waiting since the end of March for some medications to come in the mail. He reports he is unable to get out and [facility].     Patient is unsure what medications he needs or what medications are being filled [facility].   [facility], spoke with [facility].   [facility] pharmacy technician reports meds were not mailed last month due to [facility] attempting to reach patient x2 attempts with no return call.   Patient reports primary care provide is [name] and cardiologist is dr branch.    Objective:  Call was placed to primary care provider's office, Dr Nemiah Commander, spoke with Luetta Nutting, to clarify medication list provider has, per [name], med list:  -aspirin 81 mg daily -albuterol inhaler -carvedilol 25 mg twice daily -citalopram 10 mg daily -hydralazine 25 mg twice daily -isosorbide mononitrate 30 mg daily -potassium chloride 20 mEq twice daily -pantoprazole 40 mg daily -simvastatin 20 mg daily -torsemide 20 mg twice daily    Allergies  Allergen Reactions  . Bee Venom Shortness Of Breath and Swelling      Assessment:  Drugs sorted by system:  Neurologic/Psychologic: -citalopram  Cardiovascular: -aspirin 81 mg -carvedilol -hydralazine -isosorbide mononitrate -lisinopril -potassium chloride -simvastatin -torsemide  Pulmonary/Allergy: -albuterol inhaler -fluticasone nasal spray -ipratropium/albuterol nebs  Gastrointestinal: -pantoprazole -ondansetron  as needed  Miscellaneous: -meclizine  -multivitamin   Medication Review Findings:  1) escitalopram was removed from list in this chart as it was not on record at [facility] or his primary care provider's office.    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    Note routed to [name].   Once medications are confirmed, will place call back to patient and [facility] with patient to assist him with his refill/med delivery.   Karrie Meres, PharmD, [place] [phone]

## 2019-01-18 NOTE — Patient Outreach (Signed)
[place] [facility]) Care Management  01/18/2019  Ronnie [name] 10/02/1946 [id number]   Successful outreach to patient to confirm receipt of meals from [employer].  Patient confirmed that he received seven meals on Friday, 4/10.20.  BSW reminded him that he will receive deliveries on the next three Fridays.  BSW will follow up with him within the next three weeks to determine if meal delivery should be extended.   [name], BSW Social Worker [phone]

## 2019-01-18 NOTE — Patient Outreach (Signed)
[place] [facility]) Care Management  01/18/2019  Ronnie [name] June 07, 1946 [id number]     Successful call to the patient.  HIPAA verified.  Called the patient to inform him that I had spoken with Marjorie Smolder  RNCM with [facility] Heart Failure Scale program. I notified the patient that working together with great collaboration we discovered the patient's data was not being received.  Marjorie Smolder had new equipment sent to the patient. The patient confirmed that he has the equipment and has spoken with Marjorie Smolder this morning.  I informed the patient that with him being apart of a case management program that I will no longer being contacting him.  Patient stated that he understood and appreciated the call.  Plan:  RN Health Coach will close the program due to the patient is apart of another CM program.  Lazaro Arms RN, BSN, [place]:  [phone]  Fax: [phone]

## 2019-01-19 ENCOUNTER — Other Ambulatory Visit: Payer: Self-pay | Admitting: Pharmacist

## 2019-01-19 NOTE — Progress Notes (Signed)
He is overdue for follow up with me, sounds like changes have been made to his regimen since I saw him 6 months ago. Ok to be off lisinopril and on torsemide per his pcp. Lattie Haw can we get this patient a virtual visit with me, overdue for follow up   J BrancH MD

## 2019-01-19 NOTE — Patient Outreach (Signed)
[facility] [facility]) Care Management  01/19/2019  Ronnie [name] 12/07/1945 [id number]  Received a message back from Dr Harl Bowie (see his note in chart from 01/18/2019) that patient was overdue for appointment, could continue torsemide and hold lisinopril, with plans for his office to contact patient for appointment.   Call to patient.    Received a message from [place], RN case Freight forwarder with [employer].  Patient had requested [facility] Pharmacist speak with Ronnie [name] first.  Care Coordination with Ronnie [name], determined she was also attempting to help patient gets his meds from mail in pill packaging and patient had her [facility] [place] [facility] some refills yesterday.  Discussed with Ronnie [name] [place] Management Pharmacist had clarified medication discrepancies with Dr Alvera Novel and Dr Harl Bowie and would contact patient to determine where he would like to fill his medications.    Successful outreach to patient, HIPAA details verified.  Patient reports he would like to get [facility] as they will come in the mail and be packaged since he has no transportation to his local pharmacy.     [facility], [facility] filled the following medications:  -carvedilol -potassium chloride -isosorbide mononitrate -citalopram -simvastatin -hydralazine -pantoprazole -torsemide  [facility] reported his medications will arrive via [place] on 01/21/2019 with a start date of 01/23/2019 and will subsequently have this schedule monthly going forward.    Plan:  Will follow-up with patient end of week to see if he received his medications. Call placed to Ronnie Smolder, RN Case Manager with [employer] to update her.  Encouraged patient to contact cardiologist office for appointment.   Ronnie [name], PharmD, [place] [phone]

## 2019-01-22 ENCOUNTER — Other Ambulatory Visit: Payer: Self-pay | Admitting: Pharmacist

## 2019-01-22 NOTE — Patient Outreach (Signed)
[place] [facility]) Care Management  01/22/2019  Ronnie [name] May 16, 1946 [id number]  Successful phone outreach to patient, HIPAA details verified.  Patient reports his [facility].  He was counseled his medications should now be in the pill packs for each day and time of day.     He was appreciative and the help and was reminded to provide any updated medications to the pharmacy.    Patient denies other pharmacy needs at this time and was advised he can contact pharmacist if needed.    He was reminded to contact his cardiologist for an appointment as well per Dr Nelly Laurence note on 01/18/2019.    Plan:  Placed phone call to social worker Amber to update on patient's case.   Pharmacy case closed.   Karrie Meres, PharmD, [place] [phone]

## 2019-02-02 ENCOUNTER — Other Ambulatory Visit: Payer: Self-pay

## 2019-02-02 NOTE — Patient Outreach (Signed)
[place] [facility]) Care Management  02/02/2019  Ronnie [name] June 07, 1946 [id number]   Follow up call to patient regarding meal delivery through [place] informed patient that he will continue to receive meals until further notice.  Because program was created in response to virus, the end date is undetermined at this point.  BSW will follow up with patient as we approach the end of the meal delivery to inform him.  BSW received confirmation that patient is on wait list for Meals on Wheels program.  [name], [place] Worker [phone]

## 2019-02-03 ENCOUNTER — Ambulatory Visit: Payer: Medicare Other

## 2019-02-22 ENCOUNTER — Other Ambulatory Visit: Payer: Self-pay

## 2019-02-22 NOTE — Patient Outreach (Signed)
[name] Care Management  02/22/2019  Ronnie [name] Oct 01, 1946 [id number]   Patient linked to [place] for meal delivery.  No other social work needs identified.  BSW closing case.  [name], BSW Social Worker [phone]

## 2019-03-15 DIAGNOSIS — M810 Age-related osteoporosis without current pathological fracture: Secondary | ICD-10-CM | POA: Diagnosis not present

## 2019-03-17 DIAGNOSIS — J449 Chronic obstructive pulmonary disease, unspecified: Secondary | ICD-10-CM | POA: Diagnosis not present

## 2019-03-17 DIAGNOSIS — R58 Hemorrhage, not elsewhere classified: Secondary | ICD-10-CM | POA: Diagnosis not present

## 2019-03-17 DIAGNOSIS — S91114A Laceration without foreign body of right lesser toe(s) without damage to nail, initial encounter: Secondary | ICD-10-CM | POA: Diagnosis not present

## 2019-03-17 DIAGNOSIS — M25571 Pain in right ankle and joints of right foot: Secondary | ICD-10-CM | POA: Diagnosis not present

## 2019-03-17 DIAGNOSIS — R55 Syncope and collapse: Secondary | ICD-10-CM | POA: Diagnosis not present

## 2019-03-17 DIAGNOSIS — Z7982 Long term (current) use of aspirin: Secondary | ICD-10-CM | POA: Diagnosis not present

## 2019-03-17 DIAGNOSIS — N184 Chronic kidney disease, stage 4 (severe): Secondary | ICD-10-CM | POA: Diagnosis not present

## 2019-03-17 DIAGNOSIS — R262 Difficulty in walking, not elsewhere classified: Secondary | ICD-10-CM | POA: Diagnosis not present

## 2019-03-17 DIAGNOSIS — I130 Hypertensive heart and chronic kidney disease with heart failure and stage 1 through stage 4 chronic kidney disease, or unspecified chronic kidney disease: Secondary | ICD-10-CM | POA: Diagnosis not present

## 2019-03-17 DIAGNOSIS — Z79899 Other long term (current) drug therapy: Secondary | ICD-10-CM | POA: Diagnosis not present

## 2019-03-17 DIAGNOSIS — W19XXXA Unspecified fall, initial encounter: Secondary | ICD-10-CM | POA: Diagnosis not present

## 2019-03-17 DIAGNOSIS — S0990XA Unspecified injury of head, initial encounter: Secondary | ICD-10-CM | POA: Diagnosis not present

## 2019-03-17 DIAGNOSIS — S299XXA Unspecified injury of thorax, initial encounter: Secondary | ICD-10-CM | POA: Diagnosis not present

## 2019-03-17 DIAGNOSIS — S0003XA Contusion of scalp, initial encounter: Secondary | ICD-10-CM | POA: Diagnosis not present

## 2019-03-17 DIAGNOSIS — S99911A Unspecified injury of right ankle, initial encounter: Secondary | ICD-10-CM | POA: Diagnosis not present

## 2019-03-17 DIAGNOSIS — S199XXA Unspecified injury of neck, initial encounter: Secondary | ICD-10-CM | POA: Diagnosis not present

## 2019-03-17 DIAGNOSIS — I252 Old myocardial infarction: Secondary | ICD-10-CM | POA: Diagnosis not present

## 2019-03-17 DIAGNOSIS — K219 Gastro-esophageal reflux disease without esophagitis: Secondary | ICD-10-CM | POA: Diagnosis not present

## 2019-03-17 DIAGNOSIS — R52 Pain, unspecified: Secondary | ICD-10-CM | POA: Diagnosis not present

## 2019-03-17 DIAGNOSIS — S92511A Displaced fracture of proximal phalanx of right lesser toe(s), initial encounter for closed fracture: Secondary | ICD-10-CM | POA: Diagnosis not present

## 2019-03-17 DIAGNOSIS — I5022 Chronic systolic (congestive) heart failure: Secondary | ICD-10-CM | POA: Diagnosis not present

## 2019-03-17 DIAGNOSIS — E785 Hyperlipidemia, unspecified: Secondary | ICD-10-CM | POA: Diagnosis not present

## 2019-03-18 DIAGNOSIS — J449 Chronic obstructive pulmonary disease, unspecified: Secondary | ICD-10-CM | POA: Diagnosis not present

## 2019-03-18 DIAGNOSIS — I130 Hypertensive heart and chronic kidney disease with heart failure and stage 1 through stage 4 chronic kidney disease, or unspecified chronic kidney disease: Secondary | ICD-10-CM | POA: Diagnosis not present

## 2019-03-18 DIAGNOSIS — N184 Chronic kidney disease, stage 4 (severe): Secondary | ICD-10-CM | POA: Diagnosis not present

## 2019-03-18 DIAGNOSIS — I5022 Chronic systolic (congestive) heart failure: Secondary | ICD-10-CM | POA: Diagnosis not present

## 2019-03-18 DIAGNOSIS — R55 Syncope and collapse: Secondary | ICD-10-CM | POA: Diagnosis not present

## 2019-03-19 DIAGNOSIS — I130 Hypertensive heart and chronic kidney disease with heart failure and stage 1 through stage 4 chronic kidney disease, or unspecified chronic kidney disease: Secondary | ICD-10-CM | POA: Diagnosis not present

## 2019-03-19 DIAGNOSIS — I5022 Chronic systolic (congestive) heart failure: Secondary | ICD-10-CM | POA: Diagnosis not present

## 2019-03-19 DIAGNOSIS — J449 Chronic obstructive pulmonary disease, unspecified: Secondary | ICD-10-CM | POA: Diagnosis not present

## 2019-03-19 DIAGNOSIS — N184 Chronic kidney disease, stage 4 (severe): Secondary | ICD-10-CM | POA: Diagnosis not present

## 2019-03-19 DIAGNOSIS — R55 Syncope and collapse: Secondary | ICD-10-CM | POA: Diagnosis not present

## 2019-03-27 DIAGNOSIS — I130 Hypertensive heart and chronic kidney disease with heart failure and stage 1 through stage 4 chronic kidney disease, or unspecified chronic kidney disease: Secondary | ICD-10-CM | POA: Diagnosis not present

## 2019-03-27 DIAGNOSIS — Z9181 History of falling: Secondary | ICD-10-CM | POA: Diagnosis not present

## 2019-03-27 DIAGNOSIS — R55 Syncope and collapse: Secondary | ICD-10-CM | POA: Diagnosis not present

## 2019-03-27 DIAGNOSIS — J449 Chronic obstructive pulmonary disease, unspecified: Secondary | ICD-10-CM | POA: Diagnosis not present

## 2019-03-27 DIAGNOSIS — K219 Gastro-esophageal reflux disease without esophagitis: Secondary | ICD-10-CM | POA: Diagnosis not present

## 2019-03-27 DIAGNOSIS — E785 Hyperlipidemia, unspecified: Secondary | ICD-10-CM | POA: Diagnosis not present

## 2019-03-27 DIAGNOSIS — I5022 Chronic systolic (congestive) heart failure: Secondary | ICD-10-CM | POA: Diagnosis not present

## 2019-03-27 DIAGNOSIS — N184 Chronic kidney disease, stage 4 (severe): Secondary | ICD-10-CM | POA: Diagnosis not present

## 2019-03-29 DIAGNOSIS — Z6821 Body mass index (BMI) 21.0-21.9, adult: Secondary | ICD-10-CM | POA: Diagnosis not present

## 2019-03-29 DIAGNOSIS — R55 Syncope and collapse: Secondary | ICD-10-CM | POA: Diagnosis not present

## 2019-03-29 DIAGNOSIS — M79672 Pain in left foot: Secondary | ICD-10-CM | POA: Diagnosis not present

## 2019-03-29 DIAGNOSIS — M79671 Pain in right foot: Secondary | ICD-10-CM | POA: Diagnosis not present

## 2019-03-29 DIAGNOSIS — I739 Peripheral vascular disease, unspecified: Secondary | ICD-10-CM | POA: Diagnosis not present

## 2019-03-29 DIAGNOSIS — L110 Acquired keratosis follicularis: Secondary | ICD-10-CM | POA: Diagnosis not present

## 2019-06-22 DIAGNOSIS — N189 Chronic kidney disease, unspecified: Secondary | ICD-10-CM | POA: Diagnosis not present

## 2019-06-22 DIAGNOSIS — E782 Mixed hyperlipidemia: Secondary | ICD-10-CM | POA: Diagnosis not present

## 2019-06-22 DIAGNOSIS — N183 Chronic kidney disease, stage 3 (moderate): Secondary | ICD-10-CM | POA: Diagnosis not present

## 2019-06-22 DIAGNOSIS — Z6821 Body mass index (BMI) 21.0-21.9, adult: Secondary | ICD-10-CM | POA: Diagnosis not present

## 2019-06-22 DIAGNOSIS — I5042 Chronic combined systolic (congestive) and diastolic (congestive) heart failure: Secondary | ICD-10-CM | POA: Diagnosis not present

## 2019-06-22 DIAGNOSIS — I10 Essential (primary) hypertension: Secondary | ICD-10-CM | POA: Diagnosis not present

## 2019-06-28 DIAGNOSIS — M79672 Pain in left foot: Secondary | ICD-10-CM | POA: Diagnosis not present

## 2019-06-28 DIAGNOSIS — M779 Enthesopathy, unspecified: Secondary | ICD-10-CM | POA: Diagnosis not present

## 2019-06-28 DIAGNOSIS — I739 Peripheral vascular disease, unspecified: Secondary | ICD-10-CM | POA: Diagnosis not present

## 2019-06-28 DIAGNOSIS — M79671 Pain in right foot: Secondary | ICD-10-CM | POA: Diagnosis not present

## 2019-07-09 DIAGNOSIS — I5023 Acute on chronic systolic (congestive) heart failure: Secondary | ICD-10-CM | POA: Diagnosis not present

## 2019-07-09 DIAGNOSIS — N179 Acute kidney failure, unspecified: Secondary | ICD-10-CM | POA: Diagnosis not present

## 2019-07-09 DIAGNOSIS — I252 Old myocardial infarction: Secondary | ICD-10-CM | POA: Diagnosis not present

## 2019-07-09 DIAGNOSIS — R531 Weakness: Secondary | ICD-10-CM | POA: Diagnosis not present

## 2019-07-09 DIAGNOSIS — D631 Anemia in chronic kidney disease: Secondary | ICD-10-CM | POA: Diagnosis not present

## 2019-07-09 DIAGNOSIS — J449 Chronic obstructive pulmonary disease, unspecified: Secondary | ICD-10-CM | POA: Diagnosis not present

## 2019-07-09 DIAGNOSIS — N184 Chronic kidney disease, stage 4 (severe): Secondary | ICD-10-CM | POA: Diagnosis not present

## 2019-07-09 DIAGNOSIS — K219 Gastro-esophageal reflux disease without esophagitis: Secondary | ICD-10-CM | POA: Diagnosis not present

## 2019-07-09 DIAGNOSIS — I130 Hypertensive heart and chronic kidney disease with heart failure and stage 1 through stage 4 chronic kidney disease, or unspecified chronic kidney disease: Secondary | ICD-10-CM | POA: Diagnosis not present

## 2019-07-09 DIAGNOSIS — I517 Cardiomegaly: Secondary | ICD-10-CM | POA: Diagnosis not present

## 2019-07-09 DIAGNOSIS — I509 Heart failure, unspecified: Secondary | ICD-10-CM | POA: Diagnosis not present

## 2019-07-09 DIAGNOSIS — E785 Hyperlipidemia, unspecified: Secondary | ICD-10-CM | POA: Diagnosis not present

## 2019-07-14 ENCOUNTER — Encounter: Payer: Self-pay | Admitting: Gastroenterology

## 2019-07-19 DIAGNOSIS — I5042 Chronic combined systolic (congestive) and diastolic (congestive) heart failure: Secondary | ICD-10-CM | POA: Diagnosis not present

## 2019-07-19 DIAGNOSIS — N189 Chronic kidney disease, unspecified: Secondary | ICD-10-CM | POA: Diagnosis not present

## 2019-07-19 DIAGNOSIS — I10 Essential (primary) hypertension: Secondary | ICD-10-CM | POA: Diagnosis not present

## 2019-07-19 DIAGNOSIS — Z6820 Body mass index (BMI) 20.0-20.9, adult: Secondary | ICD-10-CM | POA: Diagnosis not present

## 2019-07-22 ENCOUNTER — Telehealth: Payer: Self-pay | Admitting: Cardiology

## 2019-07-22 ENCOUNTER — Encounter: Payer: Self-pay | Admitting: *Deleted

## 2019-07-22 NOTE — Telephone Encounter (Signed)

## 2019-07-23 ENCOUNTER — Encounter: Payer: Self-pay | Admitting: Cardiology

## 2019-07-23 ENCOUNTER — Telehealth (INDEPENDENT_AMBULATORY_CARE_PROVIDER_SITE_OTHER): Payer: Medicare Other | Admitting: Cardiology

## 2019-07-23 ENCOUNTER — Telehealth: Payer: Self-pay | Admitting: Cardiology

## 2019-07-23 VITALS — BP 184/90 | HR 73 | Ht 71.0 in | Wt 147.0 lb

## 2019-07-23 DIAGNOSIS — I5022 Chronic systolic (congestive) heart failure: Secondary | ICD-10-CM | POA: Diagnosis not present

## 2019-07-23 DIAGNOSIS — I10 Essential (primary) hypertension: Secondary | ICD-10-CM

## 2019-07-23 NOTE — Patient Instructions (Signed)
Your physician recommends that you schedule a follow-up appointment NEXT WEEK WITH DR [facility] ON THE SAME DAY AS YOUR TEST   Your physician recommends that you continue on your current medications as directed. Please refer to the Current Medication list given to you today.  Your physician has requested that you have an echocardiogram NEXT WEEK AT [facility]. Echocardiography is a painless test that uses sound waves to create images of your heart. It provides your doctor with information about the size and shape of your heart and how well your heart's chambers and valves are working. This procedure takes approximately one hour. There are no restrictions for this procedure.  Thank you for choosing [facility]!!

## 2019-07-23 NOTE — Telephone Encounter (Signed)
Pre-cert Verification for the following procedure    Echo scheduled for 07-26-2019 at [facility]

## 2019-07-23 NOTE — Progress Notes (Signed)
Virtual Visit via Telephone Note   This visit type was conducted due to national recommendations for restrictions regarding the COVID-19 Pandemic (e.g. social distancing) in an effort to limit this patient's exposure and mitigate transmission in our community.  Due to his co-morbid illnesses, this patient is at least at moderate risk for complications without adequate follow up.  This format is felt to be most appropriate for this patient at this time.  The patient did not have access to video technology/had technical difficulties with video requiring transitioning to audio format only (telephone).  All issues noted in this document were discussed and addressed.  No physical exam could be performed with this format.  Please refer to the patient's chart for his  consent to telehealth for [facility].   Date:  07/27/2019   ID:  Ronnie [name], DOB 19-Oct-1945, MRN [id number]  Patient Location: Home Provider Location: Office  PCP:  Neale Burly, MD  Cardiologist:  Carlyle Dolly, MD  Electrophysiologist:  None   Evaluation Performed:  Follow-Up Visit  Chief Complaint:  Follow up  History of Present Illness:    Ronnie [name] is a 73 y.o. male seen today for follow up of the following medical problems.   1. Chronic systolic HF - records from 2014 indicate admission with acute HF. Echo at that time showed VLEF 35-40%, grade II diastolic dysfunction - he refused cath at that time - never followed up as outpatient.  05/2018 echo LVEF 25-30%, diffuse hypokinesis, grade II diastolic dysfunction. Moderate MR, moderate RV dysfunction   - poor follow up, has not seen him in 1 year - admissted to [facility] 12/4740 with systolic HF. - CXR mild cardiomegaly, no acute pathology. EKG SR, LVH with chrnoic strain pattern - Cr 2.59 BUN 53 trop 0.07 BNP 1400. Cr did decrease to 2 on discharge.  - breathing remains up and down. Some abdominal swelling at times,  - weights stable  147 lbs.     2. HTN - mixed compliance with meds   3. CKD   The patient does not have symptoms concerning for COVID-19 infection (fever, chills, cough, or new shortness of breath).    Past Medical History:  Diagnosis Date  . Chronic combined systolic (congestive) and diastolic (congestive) heart failure (HCC)    a. EF 35 to 40% by echocardiogram in 2014 --> declined cath at that time and did not reestablish care with Cardiology until 04/2018  . Dyslipidemia   . Hypertension   . Shortness of breath    Past Surgical History:  Procedure Laterality Date  . BIOPSY  05/28/2018   Procedure: BIOPSY;  Surgeon: Danie Binder, MD;  [facility];  Service: Endoscopy;;  duodenal biopsy , gastric biopsy  . DENTAL SURGERY    . ESOPHAGOGASTRODUODENOSCOPY (EGD) WITH PROPOFOL N/A 05/28/2018   Procedure: ESOPHAGOGASTRODUODENOSCOPY (EGD) WITH PROPOFOL;  Surgeon: Danie Binder, MD;  Location: [facility];  Service: Endoscopy;  Laterality: N/A;  7:30am  . SAVORY DILATION N/A 05/28/2018   Procedure: SAVORY DILATION;  Surgeon: Danie Binder, MD;  Location: [facility];  Service: Endoscopy;  Laterality: N/A;     Current Meds  Medication Sig  . amLODipine (NORVASC) 5 MG tablet Take 5 mg by mouth daily.  Mar[place] Kitchen aspirin EC 81 MG EC tablet Take 1 tablet (81 mg total) by mouth daily.  . citalopram (CELEXA) 10 MG tablet Take 1 tablet (10 mg total) by mouth daily.  . fluticasone (FLONASE)  50 MCG/ACT nasal spray Place 1 spray into both nostrils daily as needed for allergies or rhinitis.  . hydrALAZINE (APRESOLINE) 25 MG tablet Take 1 tablet (25 mg total) by mouth every 8 (eight) hours. (Patient taking differently: Take 25 mg by mouth 2 (two) times daily. )  . ipratropium-albuterol (DUONEB) 0.5-2.5 (3) MG/3ML SOLN Take 3 mLs by nebulization every 6 (six) hours as needed (shortness of breath).  . isosorbide mononitrate (IMDUR) 30 MG 24 hr tablet Take 30 mg by mouth daily.   . meclizine  (ANTIVERT) 25 MG tablet Take 25 mg by mouth 3 (three) times daily as needed for dizziness.  . Multiple Vitamin (MULTIVITAMIN WITH MINERALS) TABS tablet Take 1 tablet by mouth daily.  . ondansetron (ZOFRAN) 4 MG tablet Take 1 tablet (4 mg total) by mouth every 6 (six) hours as needed for nausea.  . pantoprazole (PROTONIX) 40 MG tablet Take 40 mg by mouth daily.  . potassium chloride SA (K-DUR,KLOR-CON) 20 MEQ tablet Take 20 mEq by mouth 2 (two) times daily.  . simvastatin (ZOCOR) 20 MG tablet Take 20 mg by mouth every evening.   . torsemide (DEMADEX) 20 MG tablet Take 20 mg by mouth 2 (two) times daily.     Allergies:   Bee venom   Social History   Tobacco Use  . Smoking status: Current Some Day Smoker    Packs/day: 0.50    Years: 50.00    Pack years: 25.00    Types: Cigarettes    Last attempt to quit: 03/29/2018    Years since quitting: 1.3  . Smokeless tobacco: Never Used  . Tobacco comment: occasional cigarette  Substance Use Topics  . Alcohol use: No  . Drug use: No     Family Hx: The patient's family history includes Cervical cancer in his sister; Heart disease in his mother; Prostate cancer in his brother; Stomach cancer in his father. There is no history of Colon cancer or Colon polyps.  ROS:   Please see the history of present illness.    All other systems reviewed and are negative.   Prior CV studies:   The following studies were reviewed today:  06/2013 echo Study Conclusions  - Left ventricle: The cavity size was at the upper limits of normal. Wall thickness was increased in a pattern of mild LVH. There was mild concentric hypertrophy. Systolic function was moderately reduced. The estimated ejection fraction was in the range of 35% to 40%. Diffuse hypokinesis. Features are consistent with a pseudonormal left ventricular filling pattern, with concomitant abnormal relaxation and increased filling pressure (grade 2 diastolic dysfunction). Doppler  parameters are consistent with elevated mean left atrial filling pressure. - Aortic valve: Trileaflet; mildly thickened leaflets. - Mitral valve: Mild to moderate regurgitation. - Right ventricle: Systolic pressure was increased. - Atrial septum: No defect or patent foramen ovale was identified. - Pulmonary arteries: PA peak pressure: 36mm Hg (S). Impressions:  05/2018 echo Study Conclusions  - Left ventricle: The cavity size was normal. Wall thickness was increased in a pattern of mild LVH. Systolic function was severely reduced. The estimated ejection fraction was in the range of 25% to 30%. Diffuse hypokinesis. Features are consistent with a pseudonormal left ventricular filling pattern, with concomitant abnormal relaxation and increased filling pressure (grade 2 diastolic dysfunction). Doppler parameters are consistent with indeterminate ventricular filling pressure. - Aortic valve: Mildly calcified annulus. Trileaflet. There was mild regurgitation. - Mitral valve: There was moderate regurgitation. - Left atrium: The atrium was moderately dilated. -  Right ventricle: Systolic function was moderately reduced. - Tricuspid valve: There was mild regurgitation. - Pulmonary arteries: PA peak pressure: 32 mm Hg (S).   Labs/Other Tests and Data Reviewed:    EKG:  No ECG reviewed.  Recent Labs: No results found for requested labs within last 8760 hours.   Recent Lipid Panel Lab Results  Component Value Date/Time   CHOL 201 (H) 06/16/2013 07:12 AM   TRIG 71 06/16/2013 07:12 AM   HDL 37 (L) 06/16/2013 07:12 AM   CHOLHDL 5.4 06/16/2013 07:12 AM   LDLCALC 150 (H) 06/16/2013 07:12 AM    Wt Readings from Last 3 Encounters:  07/23/19 147 lb (66.7 kg)  07/30/18 147 lb (66.7 kg)  06/11/18 144 lb 12.8 oz (65.7 kg)     Objective:    Vital Signs:  BP (!) 184/90   Pulse 73   Ht 5\' 11"  (1.803 m)   Wt 147 lb (66.7 kg)   BMI 20.50 kg/m    Today's Vitals    07/23/19 1026  BP: (!) 184/90  Pulse: 73  Weight: 147 lb (66.7 kg)  Height: 5\' 11"  (1.803 m)   Body mass index is 20.5 kg/m. Normal affect. Normal speech pattern and tone. Comfortable, no apparent distress. No audible signs of SOB or wheezing.   ASSESSMENT & PLAN:   1. Chronic sysotlic HF - medical therapy limited due to significant orthostatic symptoms. Also continues to have mixed compliance with medications. He currently is unaware of his regimen and how he is taking his meds - really need to have him come in with all his pills and reexamine him from a fluid standpoint, will work to add him on for a face to face visit next week. Also needs echo to reevaluate LVEF  2. HTN -mixed compliance with meds, he is not clear on what he is taking - bring all pills with him next week for our visit to get things straightened out    COVID-19 Education: The signs and symptoms of COVID-19 were discussed with the patient and how to seek care for testing (follow up with PCP or arrange E-visit).  The importance of social distancing was discussed today.  Time:   Today, I have spent 16  minutes with the patient with telehealth technology discussing the above problems.     Medication Adjustments/Labs and Tests Ordered: Current medicines are reviewed at length with the patient today.  Concerns regarding medicines are outlined above.   Tests Ordered: Orders Placed This Encounter  Procedures  . ECHOCARDIOGRAM COMPLETE    Medication Changes: No orders of the defined types were placed in this encounter.   Follow Up:  In Person in 1 week(s)  Signed, Carlyle Dolly, MD  07/27/2019 12:36 PM

## 2019-07-26 ENCOUNTER — Ambulatory Visit: Payer: Medicare Other | Admitting: Cardiology

## 2019-07-26 ENCOUNTER — Ambulatory Visit (HOSPITAL_COMMUNITY): Payer: Medicare Other

## 2019-07-26 NOTE — Progress Notes (Deleted)
Clinical Summary Ronnie [name] is a 73 y.o.male  1. Chronic systolic HF - records from 2014 indicate admission with acute HF. Echo at that time showed VLEF 35-40%, grade II diastolic dysfunction - he refused cath at that time - never followed up as outpatient.  05/2018 echo LVEF 25-30%, diffuse hypokinesis, grade II diastolic dysfunction. Moderate MR, moderate RV dysfunction  - last visit we increased coreg to 25mg  bid.Some dizziness - occasional leg swelling. Home weights 141 lbs and stable.  - mixed compliance with meds. Misses1-2 days per week.   - poor follow up, has not seen him in 1 year - admissted to [facility] 22/0254 with systolic HF. - CXR mild cardiomegaly, no acute pathology. EKG SR, LVH with chrnoic strain pattern - Cr 2.59 BUN 53 trop 0.07 BNP 1400. Cr did decrease to 2 on discharge.   Echo???  - breathing remains up and down. Some abdominal swelling at times,  - weights stable 147 lbs.     2. HTN -mixed compliance with meds  - home sbp's typically 170s  - cataract surgery, possible dental surgeryon hold due to ongoing heart issues. - unsure of meds  3. CKD  Past Medical History:  Diagnosis Date  . Chronic combined systolic (congestive) and diastolic (congestive) heart failure (HCC)    a. EF 35 to 40% by echocardiogram in 2014 --> declined cath at that time and did not reestablish care with Cardiology until 04/2018  . Dyslipidemia   . Hypertension   . Shortness of breath      Allergies  Allergen Reactions  . Bee Venom Shortness Of Breath and Swelling     Current Outpatient Medications  Medication Sig Dispense Refill  . amLODipine (NORVASC) 5 MG tablet Take 5 mg by mouth daily.    Marland Kitchen aspirin EC 81 MG EC tablet Take 1 tablet (81 mg total) by mouth daily.    . carvedilol (COREG) 25 MG tablet Take 1 tablet (25 mg total) by mouth 2 (two) times daily. 180 tablet 0  . citalopram (CELEXA) 10 MG tablet Take 1 tablet (10 mg total)  by mouth daily. 30 tablet 0  . fluticasone (FLONASE) 50 MCG/ACT nasal spray Place 1 spray into both nostrils daily as needed for allergies or rhinitis.    . hydrALAZINE (APRESOLINE) 25 MG tablet Take 1 tablet (25 mg total) by mouth every 8 (eight) hours. (Patient taking differently: Take 25 mg by mouth 2 (two) times daily. ) 90 tablet 0  . ipratropium-albuterol (DUONEB) 0.5-2.5 (3) MG/3ML SOLN Take 3 mLs by nebulization every 6 (six) hours as needed (shortness of breath).    . isosorbide mononitrate (IMDUR) 30 MG 24 hr tablet Take 30 mg by mouth daily.   0  . meclizine (ANTIVERT) 25 MG tablet Take 25 mg by mouth 3 (three) times daily as needed for dizziness.    . Multiple Vitamin (MULTIVITAMIN WITH MINERALS) TABS tablet Take 1 tablet by mouth daily.    . ondansetron (ZOFRAN) 4 MG tablet Take 1 tablet (4 mg total) by mouth every 6 (six) hours as needed for nausea. 20 tablet 0  . pantoprazole (PROTONIX) 40 MG tablet Take 40 mg by mouth daily.    . potassium chloride SA (K-DUR,KLOR-CON) 20 MEQ tablet Take 20 mEq by mouth 2 (two) times daily.    . simvastatin (ZOCOR) 20 MG tablet Take 20 mg by mouth every evening.     . torsemide (DEMADEX) 20 MG tablet Take 20 mg by mouth  2 (two) times daily.     No current facility-administered medications for this visit.      Past Surgical History:  Procedure Laterality Date  . BIOPSY  05/28/2018   Procedure: BIOPSY;  Surgeon: Danie Binder, MD;  [facility];  Service: Endoscopy;;  duodenal biopsy , gastric biopsy  . DENTAL SURGERY    . ESOPHAGOGASTRODUODENOSCOPY (EGD) WITH PROPOFOL N/A 05/28/2018   Procedure: ESOPHAGOGASTRODUODENOSCOPY (EGD) WITH PROPOFOL;  Surgeon: Danie Binder, MD;  [facility];  Service: Endoscopy;  Laterality: N/A;  7:30am  . SAVORY DILATION N/A 05/28/2018   Procedure: SAVORY DILATION;  Surgeon: Danie Binder, MD;  [facility]: [facility];  Service: Endoscopy;  Laterality: N/A;     Allergies  Allergen  Reactions  . Bee Venom Shortness Of Breath and Swelling      Family History  Problem Relation Age of Onset  . Heart disease Mother   . Stomach cancer Father   . Cervical cancer Sister   . Prostate cancer Brother   . Colon cancer Neg Hx   . Colon polyps Neg Hx      Social History Ronnie [name] reports that he has been smoking cigarettes. He has a 25.00 pack-year smoking history. He has never used smokeless tobacco. Ronnie [name] reports no history of alcohol use.   Review of Systems CONSTITUTIONAL: No weight loss, fever, chills, weakness or fatigue.  HEENT: Eyes: No visual loss, blurred vision, double vision or yellow sclerae.No hearing loss, sneezing, congestion, runny nose or sore throat.  SKIN: No rash or itching.  CARDIOVASCULAR:  RESPIRATORY: No shortness of breath, cough or sputum.  GASTROINTESTINAL: No anorexia, nausea, vomiting or diarrhea. No abdominal pain or blood.  GENITOURINARY: No burning on urination, no polyuria NEUROLOGICAL: No headache, dizziness, syncope, paralysis, ataxia, numbness or tingling in the extremities. No change in bowel or bladder control.  MUSCULOSKELETAL: No muscle, back pain, joint pain or stiffness.  LYMPHATICS: No enlarged nodes. No history of splenectomy.  PSYCHIATRIC: No history of depression or anxiety.  ENDOCRINOLOGIC: No reports of sweating, cold or heat intolerance. No polyuria or polydipsia.  Marland Kitchen   Physical Examination There were no vitals filed for this visit. There were no vitals filed for this visit.  Gen: resting comfortably, no acute distress HEENT: no scleral icterus, pupils equal round and reactive, no palptable cervical adenopathy,  CV Resp: Clear to auscultation bilaterally GI: abdomen is soft, non-tender, non-distended, normal bowel sounds, no hepatosplenomegaly MSK: extremities are warm, no edema.  Skin: warm, no rash Neuro:  no focal deficits Psych: appropriate affect   Diagnostic Studies  06/2013 echo Study  Conclusions  - Left ventricle: The cavity size was at the upper limits of normal. Wall thickness was increased in a pattern of mild LVH. There was mild concentric hypertrophy. Systolic function was moderately reduced. The estimated ejection fraction was in the range of 35% to 40%. Diffuse hypokinesis. Features are consistent with a pseudonormal left ventricular filling pattern, with concomitant abnormal relaxation and increased filling pressure (grade 2 diastolic dysfunction). Doppler parameters are consistent with elevated mean left atrial filling pressure. - Aortic valve: Trileaflet; mildly thickened leaflets. - Mitral valve: Mild to moderate regurgitation. - Right ventricle: Systolic pressure was increased. - Atrial septum: No defect or patent foramen ovale was identified. - Pulmonary arteries: PA peak pressure: 40mm Hg (S). Impressions:  05/2018 echo Study Conclusions  - Left ventricle: The cavity size was normal. Wall thickness was increased in a pattern of mild LVH.  Systolic function was severely reduced. The estimated ejection fraction was in the range of 25% to 30%. Diffuse hypokinesis. Features are consistent with a pseudonormal left ventricular filling pattern, with concomitant abnormal relaxation and increased filling pressure (grade 2 diastolic dysfunction). Doppler parameters are consistent with indeterminate ventricular filling pressure. - Aortic valve: Mildly calcified annulus. Trileaflet. There was mild regurgitation. - Mitral valve: There was moderate regurgitation. - Left atrium: The atrium was moderately dilated. - Right ventricle: Systolic function was moderately reduced. - Tricuspid valve: There was mild regurgitation. - Pulmonary arteries: PA peak pressure: 32 mm Hg (S).  Assessment and Plan   1. Chronic sysotlic HF - medical therapy limited due to significant orthostatic symptoms. Also continues to have mixed  compliance with medications. Encouraged strict medication compliance. -repeat labs, pending renal function will need to reconsider cath. Not sure Cr is going to signifcantly improve, we may need to accept the increased risk for cath, his CHF and LV dysfunction has been progressing.   2. HTN -has not taken all meds yet today, continue to monitor.   3. CKD III - repeat labs     Arnoldo Lenis, M.D., F.A.C.C.

## 2019-08-02 ENCOUNTER — Other Ambulatory Visit: Payer: Self-pay

## 2019-08-02 ENCOUNTER — Ambulatory Visit (INDEPENDENT_AMBULATORY_CARE_PROVIDER_SITE_OTHER): Payer: Medicare Other | Admitting: Cardiology

## 2019-08-02 ENCOUNTER — Encounter: Payer: Self-pay | Admitting: Cardiology

## 2019-08-02 ENCOUNTER — Ambulatory Visit (HOSPITAL_COMMUNITY)
Admission: RE | Admit: 2019-08-02 | Discharge: 2019-08-02 | Disposition: A | Discharge status: Home / Self Care | Payer: Medicare Other | Source: Ambulatory Visit | Attending: Cardiology | Admitting: Cardiology

## 2019-08-02 VITALS — BP 122/68 | HR 79 | Temp 97.5°F | Ht 71.0 in | Wt 153.0 lb

## 2019-08-02 DIAGNOSIS — Z7689 Persons encountering health services in other specified circumstances: Secondary | ICD-10-CM | POA: Diagnosis not present

## 2019-08-02 DIAGNOSIS — I5022 Chronic systolic (congestive) heart failure: Secondary | ICD-10-CM

## 2019-08-02 DIAGNOSIS — I10 Essential (primary) hypertension: Secondary | ICD-10-CM | POA: Diagnosis not present

## 2019-08-02 LAB — ECHOCARDIOGRAM COMPLETE
Height: 71 in
Weight: 2448 oz

## 2019-08-02 NOTE — Patient Instructions (Addendum)
Medication Instructions:  Your physician recommends that you continue on your current medications as directed. Please refer to the Current Medication list given to you today.   Labwork: I WILL REQUEST LABS FROM PCP   Testing/Procedures: NONE   Follow-Up: Your physician recommends that you schedule a follow-up appointment in: 6 WEEKS    Any Other Special Instructions Will Be Listed Below (If Applicable).  You have been referred to [facility]. They will contact  you.     If you need a refill on your cardiac medications before your next appointment, please call your pharmacy.

## 2019-08-02 NOTE — Progress Notes (Signed)
*  PRELIMINARY RESULTS* Echocardiogram 2D Echocardiogram has been performed.  Ronnie [name] 08/02/2019, 2:57 PM

## 2019-08-02 NOTE — Progress Notes (Signed)
Clinical Summary [name] is a 73 y.o.male seen today for follow up of the following medical problems.   1. Chronic systolic HF - records from 2014 indicate admission with acute HF. Echo at that time showed VLEF 35-40%, grade II diastolic dysfunction - he refused cath at that time 05/2018 echo LVEF 25-30%, diffuse hypokinesis, grade II diastolic dysfunction. Moderate MR, moderate RV dysfunction  - poor compliance with medications and follow up - today we had him come for an in clinic visit specifically to go over his pill bottles but he forgot to bring them - some SOB at times. Some abdmoninal distension. Occaiosnal orthopnea - no recent chest pain - he did not bring his pills with him.  - ongoing orthostatic symptoms.      2. HTN  - checks bp daily, SBPs up to 160 or 170s at times    3. CKD - followed by pcp, he did not establish with nephrology - he reports last labs with pcp  4. Former smoker - over 77 years. +cough +wheezing.  - has nebulizer. He is not sure if he has any other inhalers. Not taking any inhalers regularly      Past Medical History:  Diagnosis Date  . Chronic combined systolic (congestive) and diastolic (congestive) heart failure (HCC)    a. EF 35 to 40% by echocardiogram in 2014 --> declined cath at that time and did not reestablish care with Cardiology until 04/2018  . Dyslipidemia   . Hypertension   . Shortness of breath      Allergies  Allergen Reactions  . Bee Venom Shortness Of Breath and Swelling     Current Outpatient Medications  Medication Sig Dispense Refill  . amLODipine (NORVASC) 5 MG tablet Take 5 mg by mouth daily.    Marland Kitchen aspirin EC 81 MG EC tablet Take 1 tablet (81 mg total) by mouth daily.    . carvedilol (COREG) 25 MG tablet Take 1 tablet (25 mg total) by mouth 2 (two) times daily. 180 tablet 0  . citalopram (CELEXA) 10 MG tablet Take 1 tablet (10 mg total) by mouth daily. 30 tablet 0  . fluticasone  (FLONASE) 50 MCG/ACT nasal spray Place 1 spray into both nostrils daily as needed for allergies or rhinitis.    . hydrALAZINE (APRESOLINE) 25 MG tablet Take 1 tablet (25 mg total) by mouth every 8 (eight) hours. (Patient taking differently: Take 25 mg by mouth 2 (two) times daily. ) 90 tablet 0  . ipratropium-albuterol (DUONEB) 0.5-2.5 (3) MG/3ML SOLN Take 3 mLs by nebulization every 6 (six) hours as needed (shortness of breath).    . isosorbide mononitrate (IMDUR) 30 MG 24 hr tablet Take 30 mg by mouth daily.   0  . meclizine (ANTIVERT) 25 MG tablet Take 25 mg by mouth 3 (three) times daily as needed for dizziness.    . Multiple Vitamin (MULTIVITAMIN WITH MINERALS) TABS tablet Take 1 tablet by mouth daily.    . ondansetron (ZOFRAN) 4 MG tablet Take 1 tablet (4 mg total) by mouth every 6 (six) hours as needed for nausea. 20 tablet 0  . pantoprazole (PROTONIX) 40 MG tablet Take 40 mg by mouth daily.    . potassium chloride SA (K-DUR,KLOR-CON) 20 MEQ tablet Take 20 mEq by mouth 2 (two) times daily.    . simvastatin (ZOCOR) 20 MG tablet Take 20 mg by mouth every evening.     . torsemide (DEMADEX) 20 MG tablet Take 20 mg by  mouth 2 (two) times daily.     No current facility-administered medications for this visit.      Past Surgical History:  Procedure Laterality Date  . BIOPSY  05/28/2018   Procedure: BIOPSY;  Surgeon: Danie Binder, MD;  [facility];  Service: Endoscopy;;  duodenal biopsy , gastric biopsy  . DENTAL SURGERY    . ESOPHAGOGASTRODUODENOSCOPY (EGD) WITH PROPOFOL N/A 05/28/2018   Procedure: ESOPHAGOGASTRODUODENOSCOPY (EGD) WITH PROPOFOL;  Surgeon: Danie Binder, MD;  [facility];  Service: Endoscopy;  Laterality: N/A;  7:30am  . SAVORY DILATION N/A 05/28/2018   Procedure: SAVORY DILATION;  Surgeon: Danie Binder, MD;  [facility];  Service: Endoscopy;  Laterality: N/A;     Allergies  Allergen Reactions  . Bee Venom Shortness Of Breath and  Swelling      Family History  Problem Relation Age of Onset  . Heart disease Mother   . Stomach cancer Father   . Cervical cancer Sister   . Prostate cancer Brother   . Colon cancer Neg Hx   . Colon polyps Neg Hx      Social History [name] reports that he has been smoking cigarettes. He has a 25.00 pack-year smoking history. He has never used smokeless tobacco. [name] reports no history of alcohol use.   Review of Systems CONSTITUTIONAL: No weight loss, fever, chills, weakness or fatigue.  HEENT: Eyes: No visual loss, blurred vision, double vision or yellow sclerae.No hearing loss, sneezing, congestion, runny nose or sore throat.  SKIN: No rash or itching.  CARDIOVASCULAR: per hpi RESPIRATORY: per hpi GASTROINTESTINAL: No anorexia, nausea, vomiting or diarrhea. No abdominal pain or blood.  GENITOURINARY: No burning on urination, no polyuria NEUROLOGICAL: No headache, dizziness, syncope, paralysis, ataxia, numbness or tingling in the extremities. No change in bowel or bladder control.  MUSCULOSKELETAL: No muscle, back pain, joint pain or stiffness.  LYMPHATICS: No enlarged nodes. No history of splenectomy.  PSYCHIATRIC: No history of depression or anxiety.  ENDOCRINOLOGIC: No reports of sweating, cold or heat intolerance. No polyuria or polydipsia.  Marland Kitchen   Physical Examination Today's Vitals   08/02/19 1319  BP: 122/68  Pulse: 79  Temp: (!) 97.5 F (36.4 C)  TempSrc: Temporal  SpO2: 98%  Weight: 153 lb (69.4 kg)  Height: 5\' 11"  (1.803 m)   Body mass index is 21.34 kg/m.  Gen: resting comfortably, no acute distress HEENT: no scleral icterus, pupils equal round and reactive, no palptable cervical adenopathy,  CV: RRR, no m/r/g, no jvd Resp: Clear to auscultation bilaterally GI: abdomen is soft, non-tender, non-distended, normal bowel sounds, no hepatosplenomegaly MSK: extremities are warm, no edema.  Skin: warm, no rash Neuro:  no focal deficits  Psych: appropriate affect   Diagnostic Studies 06/2013 echo Study Conclusions  - Left ventricle: The cavity size was at the upper limits of normal. Wall thickness was increased in a pattern of mild LVH. There was mild concentric hypertrophy. Systolic function was moderately reduced. The estimated ejection fraction was in the range of 35% to 40%. Diffuse hypokinesis. Features are consistent with a pseudonormal left ventricular filling pattern, with concomitant abnormal relaxation and increased filling pressure (grade 2 diastolic dysfunction). Doppler parameters are consistent with elevated mean left atrial filling pressure. - Aortic valve: Trileaflet; mildly thickened leaflets. - Mitral valve: Mild to moderate regurgitation. - Right ventricle: Systolic pressure was increased. - Atrial septum: No defect or patent foramen ovale was identified. - Pulmonary arteries: PA peak  pressure: 49mm Hg (S). Impressions:  05/2018 echo Study Conclusions  - Left ventricle: The cavity size was normal. Wall thickness was increased in a pattern of mild LVH. Systolic function was severely reduced. The estimated ejection fraction was in the range of 25% to 30%. Diffuse hypokinesis. Features are consistent with a pseudonormal left ventricular filling pattern, with concomitant abnormal relaxation and increased filling pressure (grade 2 diastolic dysfunction). Doppler parameters are consistent with indeterminate ventricular filling pressure. - Aortic valve: Mildly calcified annulus. Trileaflet. There was mild regurgitation. - Mitral valve: There was moderate regurgitation. - Left atrium: The atrium was moderately dilated. - Right ventricle: Systolic function was moderately reduced. - Tricuspid valve: There was mild regurgitation. - Pulmonary arteries: PA peak pressure: 32 mm Hg (S).   Assessment and Plan   1. Chronic sysotlic HF - medical therapy limited  due to significant orthostatic symptoms.  - he has had poor compliance with follow up and medications. Transportation has been an issue as well to appointments and testing for him - it remains unclear what he is taking at home. He forgot to bring his pill bottles with him as we had discussed  - we consult [facility] to help navigate his home medications, it remains unclear what and how he is taking his meds, impossible to make adjustement to help his CHF and his orthostatic symptoms without this data - f/u repeat echo today.  - does not appear significantly volume overloaded today  2. HTN -mixed compliance with meds - reported home bp's elevated, he has normal bp today - need to establish what he is taking at home, f/u [facility] consult prior to making adjustment  3. COPD - he reports a prior diagnosis, has just a prn nebulizer. We don't have any PFT data on him - I suspect his SOB/DOE is related to both HF and COPD. Likely repeat PFTs once we get him back on track with his CHF management.    F/u 6 weeks   Arnoldo Lenis, M.D.

## 2019-08-03 ENCOUNTER — Other Ambulatory Visit: Payer: Self-pay | Admitting: *Deleted

## 2019-08-03 ENCOUNTER — Encounter: Payer: Self-pay | Admitting: *Deleted

## 2019-08-03 DIAGNOSIS — I10 Essential (primary) hypertension: Secondary | ICD-10-CM

## 2019-08-03 DIAGNOSIS — I509 Heart failure, unspecified: Secondary | ICD-10-CM

## 2019-08-03 NOTE — Patient Outreach (Signed)
Referral received from cardiologist for  medication management citing poor compliance with medications, outreach call to pt for screening, spoke with pt, HIPAA verified, pt states he lives alone and ex-wife assists him as well as other friends/ family, pt utilizes [employer] for transportation if needed, receives meals on wheels and food stamps, interested in applying for [facility] services for additional assistance.  Pt reports he does have depression and has had for years.  Pt is monitored by [facility] for daily weights and pt states " I talk directly to their nurse if I gain weight or have a problem"  Pt sates he was at [facility] "for fluid on the lungs" and discharged on 07/12/19.  Pt states he has chronic dizziness that " has gone on for years".  RN CM reviewed medications with pt,  Pt reads out the name of all the medications but does seem to have some confusion.  Order placed for [facility] pharmacist and CSW (to apply for PCS and management/ resources for depression).  RN CM faxed barrier letter and today's note to primary care MD.  Outpatient Encounter Medications as of 08/03/2019  Medication Sig Note  . amLODipine (NORVASC) 5 MG tablet Take 5 mg by mouth daily.   . citalopram (CELEXA) 10 MG tablet Take 1 tablet (10 mg total) by mouth daily. (Patient taking differently: Take 20 mg by mouth daily. ) 05/29/2018: Pt states he takes both Lexapro and Celexa, I requested he call his dr to get clarification.  . fluticasone (FLONASE) 50 MCG/ACT nasal spray Place 1 spray into both nostrils daily as needed for allergies or rhinitis.   . hydrALAZINE (APRESOLINE) 25 MG tablet Take 1 tablet (25 mg total) by mouth every 8 (eight) hours. (Patient taking differently: Take 25 mg by mouth 2 (two) times daily. )   . isosorbide mononitrate (IMDUR) 30 MG 24 hr tablet Take 30 mg by mouth daily.    . Multiple Vitamin (MULTIVITAMIN WITH MINERALS) TABS tablet Take 1 tablet by mouth daily.   . pantoprazole (PROTONIX) 40  MG tablet Take 40 mg by mouth daily.   . potassium chloride SA (K-DUR,KLOR-CON) 20 MEQ tablet Take 20 mEq by mouth daily.    . simvastatin (ZOCOR) 20 MG tablet Take 20 mg by mouth every evening.    . torsemide (DEMADEX) 20 MG tablet Take 20 mg by mouth 2 (two) times daily.   Marland Kitchen aspirin EC 81 MG EC tablet Take 1 tablet (81 mg total) by mouth daily. (Patient not taking: Reported on 08/03/2019)   . carvedilol (COREG) 25 MG tablet Take 1 tablet (25 mg total) by mouth 2 (two) times daily.   Marland Kitchen ipratropium-albuterol (DUONEB) 0.5-2.5 (3) MG/3ML SOLN Take 3 mLs by nebulization every 6 (six) hours as needed (shortness of breath).   . isosorbide dinitrate (ISORDIL) 30 MG tablet    . meclizine (ANTIVERT) 25 MG tablet Take 25 mg by mouth 3 (three) times daily as needed for dizziness.   . ondansetron (ZOFRAN) 4 MG tablet Take 1 tablet (4 mg total) by mouth every 6 (six) hours as needed for nausea. (Patient not taking: Reported on 08/03/2019)    No facility-administered encounter medications on file as of 08/03/2019.    [facility] CM Care Plan Problem One     Most Recent Value  Care Plan Problem One  Knowledge deficit related to CHF  Role Documenting the Problem One  Care Management [place] for Problem One  Active  [facility] Long Term  Goal   Pt will demonstrate improved self care for management of CHF within 60 days  [facility] Long Term Goal Start Date  08/04/19  Interventions for Problem One Long Term Goal  RN CM established plan of care with pt, reviewed goals for CHF, mailed successful outreach letter to pt home including pamphlet, 24 hour nurse line magnet, reviewed medications with pt, pharmacy and social work referral completed  [facility] CM Short Term Goal #1   Pt will verbalize HF action plan within 30 days  [facility] CM Short Term Goal #1 Start Date  08/04/19  Interventions for Short Term Goal #1  RN CM reviewed HF action plan, pt is in green zone today, placed emphasis on yellow zone and who to call    [facility] CM  Care Plan Problem Two     Most Recent Value  Care Plan Problem Two  High risk for falls  Role Documenting the Problem Two  Care Management [place] for Problem Two  Active  [facility] CM Short Term Goal #1   Pt will utilize safety precautions and use assistive devices as needed within 30 days  [facility] CM Short Term Goal #1 Start Date  08/04/19  Interventions for Short Term Goal #2   RN CM reviewed safety precautions, reminded pt to use walker and not to ambulate if dizzy      PLAN Outreach pt weekly for transition of care  [name] [facility], [place] Coordinator [phone]

## 2019-08-04 ENCOUNTER — Other Ambulatory Visit: Payer: Self-pay | Admitting: Pharmacist

## 2019-08-04 NOTE — Patient Outreach (Signed)
Ronnie [name]) Care Management  [name]   08/04/2019  JEANCLAUDE [name] Apr 05, 1946 [id number]  Reason for referral: Medication Management  Referral source: Dr. Ina Kick Current insurance: [facility]  PMHx includes but not limited to:  CHF (EF 25-30% 8/'19), HTN, HLD, CKD (has not followed up with nephrology as recommended by PCP), smoker (25 pack-year history).  Per cardiology notes, patient has poor compliance with medications.   MD unable to adjust medications as it is unclear how patient is actually taking medications at home. Patient had visit to review pill bottles with cardiology office on 10/26 however forgot to bring in medications.    Patient previously assisted by [facility] Pharmacist in April 2020 in which he was started on multidose pharmacy pill-packs through [place]. [phone])  Patient is enrolled in [facility] CHF program and uses provided scale for daily weights. Followed by [facility] RN, Marjorie Smolder.   Outreach:  Successful telephone call with patient.  HIPAA identifiers verified.   Subjective:  -Patient reports he uses both pill-packs which [facility] and [facility]  He states he is happy using the packs and wants to continue with this method.  He is unsure why 2 mediations are in bottles and not included in pack.  He is able to review medications telephonically with me.    -Patient reports having intermittent symptoms of dizziness, loss of appetite, HA, vomiting (2x week depending on what he eats).   -Patient reports elevated BPs at home, most often in the morning.  He reports SBP can be >160-200s.     Objective: The ASCVD Risk score [name] DC Jr., et al., 2013) failed to calculate for the following reasons:   The patient has a prior MI or stroke diagnosis  Lab Results  Component Value Date   CREATININE 2.11 (H) 05/12/2018   CREATININE 2.09 (H) 05/12/2018   CREATININE 1.81 (H) 05/11/2018     Lab Results  Component Value Date   HGBA1C 5.1 06/16/2013    Lipid Panel     Component Value Date/Time   CHOL 201 (H) 06/16/2013 0712   TRIG 71 06/16/2013 0712   HDL 37 (L) 06/16/2013 0712   CHOLHDL 5.4 06/16/2013 0712   VLDL 14 06/16/2013 0712   LDLCALC 150 (H) 06/16/2013 0712    BP Readings from Last 3 Encounters:  08/02/19 122/68  07/23/19 (!) 184/90  07/30/18 140/84    Allergies  Allergen Reactions  . Bee Venom Shortness Of Breath and Swelling    Medications Reviewed Today    Reviewed by Kassie Mends, RN (Registered Nurse) on 08/03/19 at 1546  Med List Status: <None>  Medication Order Taking? Sig Documenting Provider Last Dose Status Informant  amLODipine (NORVASC) 5 MG tablet [id number] Yes Take 5 mg by mouth daily. [provider] Taking Active   aspirin EC 81 MG EC tablet [id number] No Take 1 tablet (81 mg total) by mouth daily.  Patient not taking: Reported on 08/03/2019   [name], MD Not Taking Consider Medication Status and Discontinue Multiple Informants  carvedilol (COREG) 25 MG tablet [id number]  Take 1 tablet (25 mg total) by mouth 2 (two) times daily. Arnoldo Lenis, MD  Expired 08/02/19 2359 Multiple Informants  citalopram (CELEXA) 10 MG tablet [id number] Yes Take 1 tablet (10 mg total) by mouth daily.  Patient taking differently: Take 20 mg by mouth daily.    [name], DO Taking Active Multiple Informants  Med Note Alvino Chapel, [place]   Fri May 29, 2018  3:41 PM) Pt states he takes both Lexapro and Celexa, I requested he call his dr to get clarification.  fluticasone (FLONASE) 50 MCG/ACT nasal spray [id number] Yes Place 1 spray into both nostrils daily as needed for allergies or rhinitis. [provider] Taking Active   hydrALAZINE (APRESOLINE) 25 MG tablet [id number] Yes Take 1 tablet (25 mg total) by mouth every 8 (eight) hours.  Patient taking differently: Take 25 mg by mouth 2 (two) times daily.    Manuella Ghazi,  Pratik D, DO Taking Active Multiple Informants  ipratropium-albuterol (DUONEB) 0.5-2.5 (3) MG/3ML SOLN [id number] No Take 3 mLs by nebulization every 6 (six) hours as needed (shortness of breath). [provider] Not Taking Active   isosorbide dinitrate (ISORDIL) 30 MG tablet [id number] No  [provider] Not Taking Active   isosorbide mononitrate (IMDUR) 30 MG 24 hr tablet [id number] Yes Take 30 mg by mouth daily.  [provider] Taking Active Multiple Informants  meclizine (ANTIVERT) 25 MG tablet [id number] No Take 25 mg by mouth 3 (three) times daily as needed for dizziness. [provider] Not Taking Active   Multiple Vitamin (MULTIVITAMIN WITH MINERALS) TABS tablet [id number] Yes Take 1 tablet by mouth daily. [provider] Taking Active   ondansetron (ZOFRAN) 4 MG tablet [id number] No Take 1 tablet (4 mg total) by mouth every 6 (six) hours as needed for nausea.  Patient not taking: Reported on 08/03/2019   [name], DO Not Taking Active   pantoprazole (PROTONIX) 40 MG tablet [id number] Yes Take 40 mg by mouth daily. [provider] Taking Active Multiple Informants  potassium chloride SA (K-DUR,KLOR-CON) 20 MEQ tablet [id number] Yes Take 20 mEq by mouth daily.  [provider] Taking Active Multiple Informants  simvastatin (ZOCOR) 20 MG tablet [id number] Yes Take 20 mg by mouth every evening.  [provider] Taking Active Multiple Informants  torsemide (DEMADEX) 20 MG tablet [id number] Yes Take 20 mg by mouth 2 (two) times daily. [provider] Taking Active Multiple Informants  Med List Note [name] 05/07/18 2343): [phone] [facility] PHARMACY RECORDS USED TO CONFIRM MEDICATION          Assessment: Drugs sorted by system:  Neurologic/Psychologic:citalopram  Hematologic: aspirin 81mg   Cardiovascular: amlodipine, carvedilol, hydralazine, isosorbide mononitrate, simvastatin, torsemide,  potassium  Pulmonary/Allergy: fluticasone NS, ipratropium-albuterol nebulizer  Gastrointestinal: nutritional supplement drink, pantoprazole  Vitamins/Minerals/Supplements: MVI  Medication Review Findings:  -Not currently on ACEI or ARB, per notes previously on lisinopril   Medications in Bottles: amlodipine, pantoprazole   Pantoptrazole: taking PRN rather than daily as prescribed, describes GERD symptoms occur at least QOD.  Reviewed instructions on bottle with patient who voiced understanding.  He will start taking daily and assess if symptoms improve.    Medications in Compliance Packs: Torsemide, potassium, carvedilol, hydralazine, isosorbide mononitrate, simvastatin, citalopram  [facility]  Per representative, patient's next pack is due next week on 08/10/2019.  Amlodipine cannot be included because it is a refill too soon as 90 DS picked up in September.  Amlodipine will be added to packs with December.  Pantoprazole will now be included in packs.    Call placed to PCP office and medication list requested to be faxed to [facility].   Plan: . Will await medication list from PCP   Ralene Bathe, PharmD, [place] [phone]

## 2019-08-05 ENCOUNTER — Other Ambulatory Visit: Payer: Self-pay | Admitting: Pharmacist

## 2019-08-05 NOTE — Patient Outreach (Signed)
[facility] [facility]) Care Management  [place] 08/05/2019  JAKARI SADA 14-Jul-1946 [id number]  Medication list faxed to [facility] from PCP office.  -Per most recent office visit on 10/12, the following discrepancies noted:  1. Amlodipine stopped but patient still taking 2. Potassium dose decreased from 38mEq BID to Qday, patient still taking BID 3. No fosamax mentioned by patient    F/u call placed to PCP office.  Patient hospitalized in October and had some medication changes post-discharge.  Unfortunately, I am unable to see hospital records in EMR.    Per PCP office, -Potassium dose confirmed as 20 mEq once daily.  -Amlodipine should be stopped per MD -Fosamax is active medication -Pantoprazole should be daily (dose changed after recent hospitalization)  RN will send in new RXs for potassium and [facility]osamax [facility].  She will discuss high blood pressure reported by patient with MD for recommendations on medication therapy management.  Pantoprazole already confirmed to be added to packs yesterday with [facility].    Plan: Will route note to cardiologist for medication assessment Will f/u with patient once medication list confirmed   Ralene Bathe, PharmD, [place] [phone]

## 2019-08-09 ENCOUNTER — Ambulatory Visit: Payer: Self-pay | Admitting: Pharmacist

## 2019-08-10 ENCOUNTER — Other Ambulatory Visit: Payer: Self-pay | Admitting: *Deleted

## 2019-08-10 NOTE — Patient Outreach (Signed)
Outreach call to pt for transition of care week 2, spoke with pt, HIPAA verified, pt reports he is lying down in bed as he "got a little dizzy earlier"  Pt states he is getting out of bed and will check his BP.  Pt reports weight today 148 pounds, states [facility] pharmacist did call him and is assisting with medication management.  Pt checked BP and reports reading of near 854 for systolic, states not sure what diastolic reading is, pt states MD is aware of his BP readings,  RN CM ask pt to take BP log to primary MD and cardiology appointments,  Pt unable to tell RN CM any other readings citing he is having a slow morning "and taking awhile to get going"    [facility] CM Care Plan Problem One     Most Recent Value  Care Plan Problem One  Knowledge deficit related to CHF  Role Documenting the Problem One  Care Management Coordinator  Care Plan for Problem One  Active  THN Long Term Goal   Pt will demonstrate improved self care for management of CHF within 60 days  THN Long Term Goal Start Date  08/04/19  Interventions for Problem One Long Term Goal  Pt continues weighing daily and checkin BP daily,  Rn CM encouraged pt to take all logs/ records to MD appointments,  RN CM reinforced plan of care.  THN CM Short Term Goal #1   Pt will verbalize HF action plan within 30 days  THN CM Short Term Goal #1 Start Date  08/04/19  Interventions for Short Term Goal #1  RN CM reinforced HF action plan, placed emphasis on yellow zone, pt states he is in green zone today    [facility] CM Care Plan Problem Two     Most Recent Value  Care Plan Problem Two  High risk for falls  Role Documenting the Problem Two  Care Management Coordinator  Care Plan for Problem Two  Active  THN CM Short Term Goal #1   Pt will utilize safety precautions and use assistive devices as needed within 30 days  THN CM Short Term Goal #1 Start Date  08/04/19  Interventions for Short Term Goal #2   RN CM reminded pt not to ambulate if dizzy and to always ask  for assistance as needed, use DME.      PLAN Continue weekly transition of care calls  [name] [facility], [place] Coordinator [phone]

## 2019-08-11 ENCOUNTER — Ambulatory Visit: Payer: Self-pay | Admitting: Pharmacist

## 2019-08-11 ENCOUNTER — Other Ambulatory Visit: Payer: Self-pay | Admitting: Pharmacist

## 2019-08-11 ENCOUNTER — Ambulatory Visit: Payer: Medicare Other | Admitting: Gastroenterology

## 2019-08-11 NOTE — Patient Outreach (Addendum)
[facility]) Care Management  [place] 08/11/2019  Ronnie [name] 06-21-1946 [id number]  Reason for call: f/u on medication mgmt / compliance pack updates   Successful outreach call to patient.  He also has Therapist, sports from [facility], [place], on video conference during my phone call.  Patient received new box of compliance packed medications on Monday.  Upon review of medications included however, no medication changes were made yet as discussed with RN last week (adding fosamax, adding pantoprazole daily, decreasing potassium to once daily).  Patient also continues to take amlodipine.  RN reports patient's BP remains elevated even with current amlodipine usage.  She will also contact PCP office today to discuss this with office and will request 2nd time that medication changes be updated with [facility] so packs reflect active medication list from PCP.    RN Marjorie Smolder) contact information is: [phone] ext [phone].    Plan: Will [facility] and patient again next week.    Ralene Bathe, PharmD, [place] [phone]  Addendum: Incoming call from [place].  She spoke with office staff who now stated that patient should stay ON amlodipine.  Other medication changes will [facility] this afternoon.  RN reports patient's BP is elevated at home before he takes his medication but then will improve to normal range within 2 hours after administration.  She reports BP cuff and scall both transmit all readings to her and she can assess patient's weight and BP every day.    Plan: Will route note to cardiologist for update.  Will f/u again with patient next week, then will close case if no further needs.   Ralene Bathe, PharmD, [place] [phone]

## 2019-08-13 ENCOUNTER — Telehealth: Payer: Self-pay | Admitting: *Deleted

## 2019-08-13 NOTE — Telephone Encounter (Signed)
-----   Message from Arnoldo Lenis, MD sent at 08/09/2019  3:52 PM EST ----- Heart function has actually improved and is back to normal which is great news. I will review the notes from the home health team and get back to him on his home medication regimen   Zandra Abts MD

## 2019-08-13 NOTE — Telephone Encounter (Signed)
LM to return call.

## 2019-08-17 ENCOUNTER — Other Ambulatory Visit: Payer: Self-pay | Admitting: *Deleted

## 2019-08-17 NOTE — Patient Outreach (Signed)
Outreach call to pt for transition of care week 3, spoke with pt, HIPAA verified, pt states he just got out of bed mid-afternoon, pt reports he continues weighing daily with weight today 150 pounds, no falls reported, no new concerns voiced.  THN CM Care Plan Problem One     Most Recent Value  Care Plan Problem One  Knowledge deficit related to CHF  Role Documenting the Problem One  Care Management Coordinator  Care Plan for Problem One  Active  THN Long Term Goal   Pt will demonstrate improved self care for management of CHF within 60 days  THN Long Term Goal Start Date  08/04/19  Interventions for Problem One Long Term Goal  RN CM reinforced plan of care with pt, pt reports he has all medications and taking as prescribed.  THN CM Short Term Goal #1   Pt will verbalize HF action plan within 30 days  THN CM Short Term Goal #1 Start Date  08/04/19  Interventions for Short Term Goal #1  RN CM reviewed HF action plan,  pt states he is in green zone today    [facility] CM Care Plan Problem Two     Most Recent Value  Care Plan Problem Two  High risk for falls  Role Documenting the Problem Two  Care Management Coordinator  Care Plan for Problem Two  Active  THN CM Short Term Goal #1   Pt will utilize safety precautions and use assistive devices as needed within 30 days  THN CM Short Term Goal #1 Start Date  08/04/19  Interventions for Short Term Goal #2   RN CM reviewed safety precautions      PLAN Continue weekly transition of care calls  [name] [facility], [place] Coordinator [phone]

## 2019-08-18 ENCOUNTER — Ambulatory Visit: Payer: Medicare Other | Admitting: Pharmacist

## 2019-08-19 ENCOUNTER — Ambulatory Visit: Payer: Self-pay | Admitting: Pharmacist

## 2019-08-23 ENCOUNTER — Other Ambulatory Visit: Payer: Self-pay | Admitting: Pharmacist

## 2019-08-23 ENCOUNTER — Ambulatory Visit: Payer: Self-pay | Admitting: Pharmacist

## 2019-08-23 NOTE — Patient Outreach (Signed)
[place] [facility]) Care Management  [place]  08/23/2019  HAGOP MCCOLLAM Dec 14, 1945 [id number]  Reason for call: f/u on medication compliance  Outreach:  Unsuccessful telephone call attempt #1 to patient.   HIPAA compliant voicemail left requesting a return call  Plan:  -I will make another outreach attempt to patient within 3-4 business days.    Ralene Bathe, PharmD, [place] [phone]

## 2019-08-24 ENCOUNTER — Other Ambulatory Visit: Payer: Self-pay | Admitting: *Deleted

## 2019-08-24 NOTE — Patient Outreach (Signed)
Outreach call to pt for transition of care week 4, no answer to telephone, left voicemail requesting return phone call.  RN CM mailed unsuccessful outreach letter to pt home.  PLAN Outreach pt in 3-4 business days  [name] [facility], [place] [phone]

## 2019-08-26 ENCOUNTER — Ambulatory Visit: Payer: Self-pay | Admitting: Pharmacist

## 2019-08-26 ENCOUNTER — Other Ambulatory Visit: Payer: Self-pay | Admitting: Pharmacist

## 2019-08-26 NOTE — Patient Outreach (Signed)
[facility] [facility]) Care Management  [place] 08/26/2019  FERREL SIMINGTON 30-Dec-1945 [id number]  Reason for call: f/u on medication compliance  Patient reports he has a swollen knee and has been in bed resting.  He is going to make an appt to see Dr. Sherrie Sport to check on it if swelling continues through the weekend.  He has discussed this with [facility] RN and does not think it is due to fluid overload / edema but rather from an injury.   Patient reports he still has elevated BPs in the AM but has f/u visit with cardiologist in 3 weeks.  He will bring his ipad to appointment to show BP readings.  He denies having any issues with compliance or medications.   We reviewed free [place] on 11/24 at [facility].  Patient voiced appreciation.  He is currently receiving food from [employer] wheels.  He denies needing any other meal assistance from LCSW.    Plan: Will sign off [place] case at this time but am happy to assist in the future as needed.   Ralene Bathe, PharmD, [place] [phone]

## 2019-08-27 ENCOUNTER — Other Ambulatory Visit: Payer: Self-pay | Admitting: *Deleted

## 2019-08-27 DIAGNOSIS — K219 Gastro-esophageal reflux disease without esophagitis: Secondary | ICD-10-CM | POA: Diagnosis not present

## 2019-08-27 DIAGNOSIS — N189 Chronic kidney disease, unspecified: Secondary | ICD-10-CM | POA: Diagnosis not present

## 2019-08-27 DIAGNOSIS — R0602 Shortness of breath: Secondary | ICD-10-CM | POA: Diagnosis not present

## 2019-08-27 DIAGNOSIS — I5022 Chronic systolic (congestive) heart failure: Secondary | ICD-10-CM | POA: Diagnosis not present

## 2019-08-27 DIAGNOSIS — I130 Hypertensive heart and chronic kidney disease with heart failure and stage 1 through stage 4 chronic kidney disease, or unspecified chronic kidney disease: Secondary | ICD-10-CM | POA: Diagnosis not present

## 2019-08-27 DIAGNOSIS — J449 Chronic obstructive pulmonary disease, unspecified: Secondary | ICD-10-CM | POA: Diagnosis not present

## 2019-08-27 DIAGNOSIS — Z833 Family history of diabetes mellitus: Secondary | ICD-10-CM | POA: Diagnosis not present

## 2019-08-27 DIAGNOSIS — N184 Chronic kidney disease, stage 4 (severe): Secondary | ICD-10-CM | POA: Diagnosis not present

## 2019-08-27 DIAGNOSIS — M7121 Synovial cyst of popliteal space [Baker], right knee: Secondary | ICD-10-CM | POA: Diagnosis not present

## 2019-08-27 DIAGNOSIS — Z79899 Other long term (current) drug therapy: Secondary | ICD-10-CM | POA: Diagnosis not present

## 2019-08-27 DIAGNOSIS — R140 Abdominal distension (gaseous): Secondary | ICD-10-CM | POA: Diagnosis not present

## 2019-08-27 DIAGNOSIS — I252 Old myocardial infarction: Secondary | ICD-10-CM | POA: Diagnosis not present

## 2019-08-27 DIAGNOSIS — I951 Orthostatic hypotension: Secondary | ICD-10-CM | POA: Diagnosis not present

## 2019-08-27 DIAGNOSIS — E785 Hyperlipidemia, unspecified: Secondary | ICD-10-CM | POA: Diagnosis not present

## 2019-08-27 DIAGNOSIS — K5900 Constipation, unspecified: Secondary | ICD-10-CM | POA: Diagnosis not present

## 2019-08-27 DIAGNOSIS — Z91030 Bee allergy status: Secondary | ICD-10-CM | POA: Diagnosis not present

## 2019-08-27 NOTE — Patient Outreach (Signed)
Outreach call to pt for transition of care week 4/  2nd attempt, spoke with pt, HIPAA verified, pt reports he just woke up, pt yawns a lot and sounds sleepy,  Pt states he has all medications and taking as prescribed, continues to have chronic dizziness and MD is aware,  RN CM ask pt not to ambulate if dizzy and ask for assistance as needed, pt states he has "swollen right knee from an old injury"  Pt states he is going to make appointment with primary MD and ride [facility] van to discuss issue with knee, pt to see cardiologist 09/15/19,  RN CM ask pt to take BP log to MD appointments, pt had to end the call as his other phone is ringing.  THN CM Care Plan Problem One     Most Recent Value  Care Plan Problem One  Knowledge deficit related to CHF  Role Documenting the Problem One  Care Management [place] for Problem One  Active  THN Long Term Goal   Pt will demonstrate improved self care for management of CHF within 60 days  THN Long Term Goal Start Date  08/04/19  THN CM Short Term Goal #1   Pt will verbalize HF action plan within 30 days  THN CM Short Term Goal #1 Start Date  08/04/19  Interventions for Short Term Goal #1  HF action plan reinforced,  pt in green zone today    [facility] CM Care Plan Problem Two     Most Recent Value  Care Plan Problem Two  High risk for falls  Role Documenting the Problem Two  Care Management [place] for Problem Two  Active  THN CM Short Term Goal #1   Pt will utilize safety precautions and use assistive devices as needed within 30 days  THN CM Short Term Goal #1 Start Date  08/04/19  Interventions for Short Term Goal #2   RN CM reinforced safety precautions      PLAN Outreach pt next month for telephone assessment  [name] [facility], [place] Coordinator [phone]

## 2019-08-28 DIAGNOSIS — M7121 Synovial cyst of popliteal space [Baker], right knee: Secondary | ICD-10-CM | POA: Diagnosis not present

## 2019-08-28 DIAGNOSIS — N184 Chronic kidney disease, stage 4 (severe): Secondary | ICD-10-CM | POA: Diagnosis not present

## 2019-08-28 DIAGNOSIS — I951 Orthostatic hypotension: Secondary | ICD-10-CM | POA: Diagnosis not present

## 2019-08-28 DIAGNOSIS — I130 Hypertensive heart and chronic kidney disease with heart failure and stage 1 through stage 4 chronic kidney disease, or unspecified chronic kidney disease: Secondary | ICD-10-CM | POA: Diagnosis not present

## 2019-08-28 DIAGNOSIS — I5022 Chronic systolic (congestive) heart failure: Secondary | ICD-10-CM | POA: Diagnosis not present

## 2019-08-29 DIAGNOSIS — N184 Chronic kidney disease, stage 4 (severe): Secondary | ICD-10-CM | POA: Diagnosis not present

## 2019-08-29 DIAGNOSIS — M7121 Synovial cyst of popliteal space [Baker], right knee: Secondary | ICD-10-CM | POA: Diagnosis not present

## 2019-08-29 DIAGNOSIS — I130 Hypertensive heart and chronic kidney disease with heart failure and stage 1 through stage 4 chronic kidney disease, or unspecified chronic kidney disease: Secondary | ICD-10-CM | POA: Diagnosis not present

## 2019-08-29 DIAGNOSIS — I951 Orthostatic hypotension: Secondary | ICD-10-CM | POA: Diagnosis not present

## 2019-08-29 DIAGNOSIS — I5022 Chronic systolic (congestive) heart failure: Secondary | ICD-10-CM | POA: Diagnosis not present

## 2019-09-09 DIAGNOSIS — Z0000 Encounter for general adult medical examination without abnormal findings: Secondary | ICD-10-CM | POA: Diagnosis not present

## 2019-09-09 DIAGNOSIS — Z6821 Body mass index (BMI) 21.0-21.9, adult: Secondary | ICD-10-CM | POA: Diagnosis not present

## 2019-09-09 DIAGNOSIS — I952 Hypotension due to drugs: Secondary | ICD-10-CM | POA: Diagnosis not present

## 2019-09-09 DIAGNOSIS — I5022 Chronic systolic (congestive) heart failure: Secondary | ICD-10-CM | POA: Diagnosis not present

## 2019-09-15 ENCOUNTER — Ambulatory Visit: Payer: Medicare Other | Admitting: Cardiology

## 2019-09-21 ENCOUNTER — Other Ambulatory Visit: Payer: Self-pay

## 2019-09-21 ENCOUNTER — Encounter: Payer: Self-pay | Admitting: Cardiology

## 2019-09-21 ENCOUNTER — Ambulatory Visit (INDEPENDENT_AMBULATORY_CARE_PROVIDER_SITE_OTHER): Payer: Medicare Other | Admitting: Cardiology

## 2019-09-21 ENCOUNTER — Encounter: Payer: Self-pay | Admitting: *Deleted

## 2019-09-21 VITALS — BP 95/53 | HR 85 | Ht 71.0 in | Wt 152.8 lb

## 2019-09-21 DIAGNOSIS — R0602 Shortness of breath: Secondary | ICD-10-CM

## 2019-09-21 DIAGNOSIS — I10 Essential (primary) hypertension: Secondary | ICD-10-CM

## 2019-09-21 DIAGNOSIS — I5022 Chronic systolic (congestive) heart failure: Secondary | ICD-10-CM | POA: Diagnosis not present

## 2019-09-21 NOTE — Patient Instructions (Addendum)
Your physician recommends that you schedule a follow-up appointment in: [place]  Your physician recommends that you continue on your current medications as directed. Please refer to the Current Medication list given to you today.  Your physician has recommended that you have a pulmonary function test. Pulmonary Function Tests are a group of tests that measure how well air moves in and out of your lungs.   Thank you for choosing [facility]!!

## 2019-09-21 NOTE — Progress Notes (Signed)
Clinical Summary Ronnie [name] is a 73 y.o.male seen today for follow up of the following medical problems.   1. Chronic systolic HF - records from 2014 indicate admission with acute HF. Echo at that time showed VLEF 35-40%, grade II diastolic dysfunction - he refused cath at that time 05/2018 echo LVEF 25-30%, diffuse hypokinesis, grade II diastolic dysfunction. Moderate MR, moderate RV dysfunction   07/2019 echo LVEF 96%, grade I diastolic dysfunction. Normal RV function.  - no LE edema. Ongoing SOB.  - he reports compliance with meds, [facility] has been helping maintain compliance at home    2. HTN - he reports high bp's in the morning by his AM checks, SBPs to the 190s at times - later in the day bp's trend down after taking meds  3. CKD - followed by pcp, he did not establish with nephrology   4. Former smoker - over 70 years. +cough +wheezing.  - has nebulizer. He is not sure if he has any other inhalers. Not taking any inhalers regularly     Past Medical History:  Diagnosis Date  . Chronic combined systolic (congestive) and diastolic (congestive) heart failure (HCC)    a. EF 35 to 40% by echocardiogram in 2014 --> declined cath at that time and did not reestablish care with Cardiology until 04/2018  . Dyslipidemia   . Hypertension   . Shortness of breath      Allergies  Allergen Reactions  . Bee Venom Shortness Of Breath and Swelling     Current Outpatient Medications  Medication Sig Dispense Refill  . amLODipine (NORVASC) 5 MG tablet Take 5 mg by mouth daily.    Marland Kitchen aspirin EC 81 MG EC tablet Take 1 tablet (81 mg total) by mouth daily.    . carvedilol (COREG) 25 MG tablet Take 25 mg by mouth 2 (two) times daily with a meal.    . citalopram (CELEXA) 20 MG tablet Take 20 mg by mouth daily.    . fluticasone (FLONASE) 50 MCG/ACT nasal spray Place 1 spray into both nostrils daily as needed for allergies or rhinitis.    . hydrALAZINE (APRESOLINE) 25 MG  tablet Take 1 tablet (25 mg total) by mouth every 8 (eight) hours. (Patient taking differently: Take 25 mg by mouth 2 (two) times daily. ) 90 tablet 0  . ipratropium-albuterol (DUONEB) 0.5-2.5 (3) MG/3ML SOLN Take 3 mLs by nebulization every 6 (six) hours as needed (shortness of breath).    . isosorbide mononitrate (IMDUR) 30 MG 24 hr tablet Take 30 mg by mouth daily.   0  . meclizine (ANTIVERT) 25 MG tablet Take 25 mg by mouth 3 (three) times daily as needed for dizziness.    . Multiple Vitamin (MULTIVITAMIN WITH MINERALS) TABS tablet Take 1 tablet by mouth daily.    . Nutritional Supplements (NUTRITIONAL DRINK SHAKE MIX PO) Take 1 Bottle by mouth daily.    . ondansetron (ZOFRAN) 4 MG tablet Take 1 tablet (4 mg total) by mouth every 6 (six) hours as needed for nausea. (Patient not taking: Reported on 08/03/2019) 20 tablet 0  . pantoprazole (PROTONIX) 40 MG tablet Take 40 mg by mouth daily. Taking PRN due to confusion with direction    . potassium chloride SA (K-DUR,KLOR-CON) 20 MEQ tablet Take 20 mEq by mouth 2 (two) times daily.     . simvastatin (ZOCOR) 20 MG tablet Take 20 mg by mouth every evening.     . torsemide (DEMADEX) 20 MG tablet  Take 20 mg by mouth 2 (two) times daily.     No current facility-administered medications for this visit.     Past Surgical History:  Procedure Laterality Date  . BIOPSY  05/28/2018   Procedure: BIOPSY;  Surgeon: Danie Binder, MD;  Location: [facility];  Service: Endoscopy;;  duodenal biopsy , gastric biopsy  . DENTAL SURGERY    . ESOPHAGOGASTRODUODENOSCOPY (EGD) WITH PROPOFOL N/A 05/28/2018   Procedure: ESOPHAGOGASTRODUODENOSCOPY (EGD) WITH PROPOFOL;  Surgeon: Danie Binder, MD;  Location: [facility];  Service: Endoscopy;  Laterality: N/A;  7:30am  . SAVORY DILATION N/A 05/28/2018   Procedure: SAVORY DILATION;  Surgeon: Danie Binder, MD;  Location: [facility];  Service: Endoscopy;  Laterality: N/A;     Allergies  Allergen Reactions  .  Bee Venom Shortness Of Breath and Swelling      Family History  Problem Relation Age of Onset  . Heart disease Mother   . Stomach cancer Father   . Cervical cancer Sister   . Prostate cancer Brother   . Colon cancer Neg Hx   . Colon polyps Neg Hx      Social History Ronnie [name] reports that he has been smoking cigarettes. He has a 25.00 pack-year smoking history. He has never used smokeless tobacco. Ronnie [name] reports no history of alcohol use.   Review of Systems CONSTITUTIONAL: No weight loss, fever, chills, weakness or fatigue.  HEENT: Eyes: No visual loss, blurred vision, double vision or yellow sclerae.No hearing loss, sneezing, congestion, runny nose or sore throat.  SKIN: No rash or itching.  CARDIOVASCULAR: per hpi RESPIRATORY: +_SOB GASTROINTESTINAL: No anorexia, nausea, vomiting or diarrhea. No abdominal pain or blood.  GENITOURINARY: No burning on urination, no polyuria NEUROLOGICAL: No headache, dizziness, syncope, paralysis, ataxia, numbness or tingling in the extremities. No change in bowel or bladder control.  MUSCULOSKELETAL: No muscle, back pain, joint pain or stiffness.  LYMPHATICS: No enlarged nodes. No history of splenectomy.  PSYCHIATRIC: No history of depression or anxiety.  ENDOCRINOLOGIC: No reports of sweating, cold or heat intolerance. No polyuria or polydipsia.  Marland Kitchen   Physical Examination Today's Vitals   09/21/19 1429  BP: (!) 95/53  Pulse: 85  SpO2: 99%  Weight: 152 lb 12.8 oz (69.3 kg)  Height: 5\' 11"  (1.803 m)   Body mass index is 21.31 kg/m.  Gen: resting comfortably, no acute distress HEENT: no scleral icterus, pupils equal round and reactive, no palptable cervical adenopathy,  CV: RRR, no m/r/g, no jvd Resp: Clear to auscultation bilaterally GI: abdomen is soft, non-tender, non-distended, normal bowel sounds, no hepatosplenomegaly MSK: extremities are warm, no edema.  Skin: warm, no rash Neuro:  no focal deficits Psych:  appropriate affect   Diagnostic Studies  06/2013 echo Study Conclusions  - Left ventricle: The cavity size was at the upper limits of normal. Wall thickness was increased in a pattern of mild LVH. There was mild concentric hypertrophy. Systolic function was moderately reduced. The estimated ejection fraction was in the range of 35% to 40%. Diffuse hypokinesis. Features are consistent with a pseudonormal left ventricular filling pattern, with concomitant abnormal relaxation and increased filling pressure (grade 2 diastolic dysfunction). Doppler parameters are consistent with elevated mean left atrial filling pressure. - Aortic valve: Trileaflet; mildly thickened leaflets. - Mitral valve: Mild to moderate regurgitation. - Right ventricle: Systolic pressure was increased. - Atrial septum: No defect or patent foramen ovale was identified. - Pulmonary arteries: PA peak pressure: 94mm Hg (S).  Impressions:  05/2018 echo Study Conclusions  - Left ventricle: The cavity size was normal. Wall thickness was increased in a pattern of mild LVH. Systolic function was severely reduced. The estimated ejection fraction was in the range of 25% to 30%. Diffuse hypokinesis. Features are consistent with a pseudonormal left ventricular filling pattern, with concomitant abnormal relaxation and increased filling pressure (grade 2 diastolic dysfunction). Doppler parameters are consistent with indeterminate ventricular filling pressure. - Aortic valve: Mildly calcified annulus. Trileaflet. There was mild regurgitation. - Mitral valve: There was moderate regurgitation. - Left atrium: The atrium was moderately dilated. - Right ventricle: Systolic function was moderately reduced. - Tricuspid valve: There was mild regurgitation. - Pulmonary arteries: PA peak pressure: 32 mm Hg (S).  07/2019 echo IMPRESSIONS    1. Left ventricular ejection fraction, by  visual estimation, is 55%. The left ventricle has normal function. Normal left ventricular size. There is moderately increased left ventricular hypertrophy.  2. Basal and mid inferolateral wall and basal inferior segment are abnormal.  3. Left ventricular diastolic Doppler parameters are consistent with impaired relaxation pattern of LV diastolic filling.  4. Global right ventricle has normal systolic function.The right ventricular size is normal. No increase in right ventricular wall thickness.  5. Left atrial size was normal.  6. Right atrial size was normal.  7. Mild aortic valve annular calcification.  8. The mitral valve is grossly normal. Mild mitral valve regurgitation.  9. The tricuspid valve is grossly normal. Tricuspid valve regurgitation is mild. 10. The aortic valve is tricuspid Aortic valve regurgitation is mild by color flow Doppler. Mild aortic valve sclerosis without stenosis. 11. There is Mild thickening of the aortic valve. 12. The pulmonic valve was grossly normal. Pulmonic valve regurgitation is not visualized by color flow Doppler. 13. The inferior vena cava is normal in size with greater than 50% respiratory variability, suggesting right atrial pressure of 3 mmHg.     Assessment and Plan    1. Chronic sysotlic HF - medical therapy limited due to significant orthostatic symptoms and poor compliance - by last echo his LVEF has actually normalized. - no signs of fluid overload. He is ongoing SOB is likely lung related. Long smoking history, he is not on any standard COPD therapy and reports prior PFTs but quite some time ago. Repeat PFTs   2. HTN -mixed compliance with meds - reported home bp's elevated in the AM that improve after meds. His bp's last few checks here have been normal. Manual recheck today 118/75. We discussed appropriate techniques to meaure bp's at home.   3. COPD - he reports a prior diagnosis, has just a prn nebulizer. We don't have any PFT  data on him - we will order formal PFTs   Request records from [facility], admission with SOB  F/u 4 months   Arnoldo Lenis, M.D.,

## 2019-09-23 ENCOUNTER — Other Ambulatory Visit: Payer: Self-pay | Admitting: *Deleted

## 2019-09-23 NOTE — Patient Outreach (Signed)
Outreach call to pt for telephone assessment, no answer to telephone, left voicemail requesting return phone call,  RN CM mailed unsuccessful outreach letter to pt home.  PLAN Outreach pt in 3-4 business days  [name] [facility], [place] [phone]

## 2019-09-29 ENCOUNTER — Encounter: Payer: Self-pay | Admitting: *Deleted

## 2019-09-29 ENCOUNTER — Other Ambulatory Visit: Payer: Self-pay | Admitting: *Deleted

## 2019-09-29 NOTE — Patient Outreach (Signed)
Outreach call to pt for telephone assessment, spoke with pt, HIPAA verified, pt reports he has not been awake long, reports weight varies from 145-152 pounds, BP today 229 for systolic and pt states " I forgot what the bottom number was"  Pt reports he has all medications and taking as prescribed, continues to have chronic dizziness that "comes and goes", MD is aware.  Pt states he talked with primary care provider about his right knee and states " it was a cyst".  Pt states " it still aches sometimes"  Pt continues to receive assistance from his ex-wife.    [facility] CM Care Plan Problem One     Most Recent Value  Care Plan Problem One  Knowledge deficit related to CHF  Role Documenting the Problem One  Care Management Coordinator  Care Plan for Problem One  Active  [facility] Long Term Goal   Pt will demonstrate improved self care for management of CHF within 60 days  [facility] Long Term Goal Start Date  08/04/19  Interventions for Problem One Long Term Goal  RN CM reviewed plan of care with pt, pt continues weighing and checking BP , reports having all medication and taking as prescribed  [facility] CM Short Term Goal #1   Pt will verbalize HF action plan within 30 days  [facility] CM Short Term Goal #1 Start Date  08/04/19  Interventions for Short Term Goal #1  RN CM reviewed HF action plan, pt reports he is in green zone today,  RN CM reminded pt to call MD early for change in health status such as weight gain, edema    [facility] CM Care Plan Problem Two     Most Recent Value  Care Plan Problem Two  High risk for falls  Role Documenting the Problem Two  Care Management [place] for Problem Two  Active  [facility] CM Short Term Goal #1   Pt will utilize safety precautions and use assistive devices as needed within 30 days  [facility] CM Short Term Goal #1 Start Date  09/29/19  Interventions for Short Term Goal #2   RN CM reviewed safety precautions, importance of using DME and do not ambulate if dizzy, ask for assistance as  needed      PLAN Outreach pt next month for telephone assessment  [name] [facility], [place] Coordinator [phone]

## 2019-10-14 ENCOUNTER — Encounter (HOSPITAL_COMMUNITY): Payer: Medicare Other

## 2019-10-27 ENCOUNTER — Other Ambulatory Visit: Payer: Self-pay | Admitting: *Deleted

## 2019-10-27 NOTE — Patient Outreach (Signed)
Outreach call to pt for telephone assessment, no answer to telephone and no option to leave voicemail.  RN CM mailed unsuccessful outreach letter to pt home.  PLAN Outreach pt in 3-4 business days  Teana Lindahl RNC, BSN [facility] Community Care Coordinator [phone]   

## 2019-11-01 ENCOUNTER — Other Ambulatory Visit: Payer: Self-pay | Admitting: *Deleted

## 2019-11-01 NOTE — Patient Outreach (Signed)
Outreach call to pt for telephone assessment, no answer to telephone and no option to leave voicemail.  PLAN Outreach pt in 3-4 business days  [name] [facility], [place] [phone]

## 2019-11-04 ENCOUNTER — Ambulatory Visit: Payer: Medicare Other | Admitting: Cardiology

## 2019-11-04 ENCOUNTER — Other Ambulatory Visit: Payer: Self-pay | Admitting: *Deleted

## 2019-11-04 NOTE — Patient Outreach (Signed)
Outreach call to pt/ 3rd attempt for telephone assessment, no answer to telephone and no option to leave voicemail.  Close case in 3-4 business days  [name] [facility], [place] Coordinator [phone]

## 2019-11-05 ENCOUNTER — Ambulatory Visit: Payer: Medicare Other | Admitting: Cardiology

## 2019-11-09 ENCOUNTER — Other Ambulatory Visit: Payer: Self-pay | Admitting: *Deleted

## 2019-11-09 NOTE — Patient Outreach (Addendum)
Case closure due to unable to maintain contact.  RN CM faxed case closure letter to primary care provider and mailed case closure letter to pt home.  Case closure  [name] [facility], [place] Coordinator [phone]

## 2019-12-02 DIAGNOSIS — I5022 Chronic systolic (congestive) heart failure: Secondary | ICD-10-CM | POA: Diagnosis not present

## 2019-12-02 DIAGNOSIS — Z0000 Encounter for general adult medical examination without abnormal findings: Secondary | ICD-10-CM | POA: Diagnosis not present

## 2019-12-02 DIAGNOSIS — Z6821 Body mass index (BMI) 21.0-21.9, adult: Secondary | ICD-10-CM | POA: Diagnosis not present

## 2019-12-02 DIAGNOSIS — R188 Other ascites: Secondary | ICD-10-CM | POA: Diagnosis not present

## 2019-12-02 DIAGNOSIS — G4700 Insomnia, unspecified: Secondary | ICD-10-CM | POA: Diagnosis not present

## 2019-12-02 DIAGNOSIS — G629 Polyneuropathy, unspecified: Secondary | ICD-10-CM | POA: Diagnosis not present

## 2019-12-02 DIAGNOSIS — I952 Hypotension due to drugs: Secondary | ICD-10-CM | POA: Diagnosis not present

## 2019-12-12 IMAGING — NM NM HEPATO W/GB/PHARM/[PERSON_NAME]
2 series · 12 of 12 positions shown · non-contrast
Comparison: CT of the abdomen and pelvis on 11/16/2017

CLINICAL DATA: Nausea and vomiting for 6 months. Abdominal pain. No
symptoms with administration of Ensure.

EXAM:
NUCLEAR MEDICINE HEPATOBILIARY IMAGING WITH GALLBLADDER EF
TECHNIQUE: Sequential images of the abdomen were obtained [DATE] minutes
following intravenous administration of radiopharmaceutical. After
oral ingestion of 8 ounces of Half-and-Half cream, gallbladder
ejection fraction was determined.
RADIOPHARMACEUTICALS:  5.1 mCi 4c-ZZm Choletec IV

[Series 1: biliary · 3.25mm/px · 6 of 60 frames shown]
[frame 6/60]
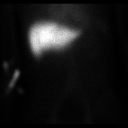
[frame 16/60]
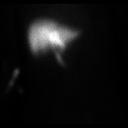
[frame 26/60]
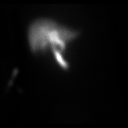
[frame 36/60]
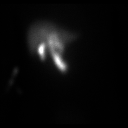
[frame 46/60]
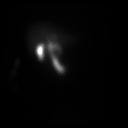
[frame 56/60]
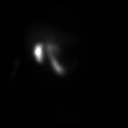

[Series 2: gbef · 3.25mm/px · 6 of 60 frames shown]
[frame 6/60]
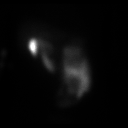
[frame 16/60]
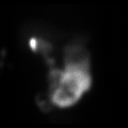
[frame 26/60]
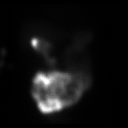
[frame 36/60]
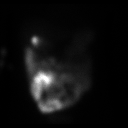
[frame 46/60]
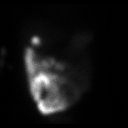
[frame 56/60]
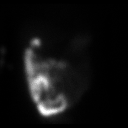

[12 of 12 positions shown; findings below may reference images not displayed]

FINDINGS: Prompt uptake and biliary excretion of activity by the liver is
seen. Gallbladder activity is visualized, consistent with patency of
cystic duct. Biliary activity passes into small bowel, consistent
with patent common bile duct.

Calculated gallbladder ejection fraction is 83%. (Normal gallbladder
ejection fraction with half-and-half is greater than 33%.)
IMPRESSION: Normal hepatobiliary exam. Normal gallbladder ejection fraction of
83%.

## 2019-12-13 ENCOUNTER — Telehealth: Payer: Self-pay | Admitting: *Deleted

## 2019-12-13 NOTE — Telephone Encounter (Signed)
[employer] nurse case manager Angelinia wanted to report to Dr Harl Bowie that pt has had abdominal distention, weight fluctuation, and SOB  - aware that he has an appt with Dr Harl Bowie in April - had cx previous appts

## 2019-12-14 NOTE — Telephone Encounter (Signed)
Pt.verified

## 2019-12-14 NOTE — Telephone Encounter (Signed)
Can we clarify with patient if he is taking his torsemide 20mg  bid, if so increase to 40mg  bid x 2 days and then resume prior dosing. Update [place] again at the end of the week   Zandra Abts MD

## 2019-12-14 NOTE — Telephone Encounter (Signed)
Pt verified he is taking torsemide 20 mg bid and will increase 40 bid for 2 days and call us back on Friday

## 2019-12-17 ENCOUNTER — Telehealth: Payer: Self-pay | Admitting: Cardiology

## 2019-12-17 ENCOUNTER — Other Ambulatory Visit: Payer: Self-pay | Admitting: *Deleted

## 2019-12-17 DIAGNOSIS — I5022 Chronic systolic (congestive) heart failure: Secondary | ICD-10-CM

## 2019-12-17 DIAGNOSIS — R0602 Shortness of breath: Secondary | ICD-10-CM

## 2019-12-17 MED ORDER — TORSEMIDE 20 MG PO TABS: 40.0000 mg | ORAL_TABLET | ORAL | 1 refills | Status: AC

## 2019-12-17 NOTE — Telephone Encounter (Signed)
Increase torsemide to 40 mg every morning and 20 mg every evening.  Obtain a basic metabolic panel next week.

## 2019-12-17 NOTE — Telephone Encounter (Signed)
Patient called to report that he thinks his breathing is better this morning.

## 2019-12-17 NOTE — Telephone Encounter (Signed)
Patient informed and verbalized understanding of plan. 

## 2019-12-17 NOTE — Telephone Encounter (Signed)
Called to give update on symptoms per last phone note Reports still some sob but says it has improved a little bit. Reports having swelling in stomach. Reports having dizziness that he's had a while and is unchanged. Denies chest pain. Weight 159.8 lbs today and says his normal weight is around 147 lbs. Patient was unable to recall what his weight has been each since 12/13/19 but gave number for his [facility] case manager Angeline [phone]) to be called to get daily weights for the past week. Left message on Angeline's vm to fax patient's weights for this week.

## 2019-12-17 NOTE — Telephone Encounter (Signed)
Received weights from [facility] who says patient normally weights around 149 lbs each day 03/08 158.2 03/10 156.4 03/11 157 03/12 158.6 Medications reviewed.

## 2019-12-23 DIAGNOSIS — I700 Atherosclerosis of aorta: Secondary | ICD-10-CM | POA: Diagnosis not present

## 2019-12-23 DIAGNOSIS — R188 Other ascites: Secondary | ICD-10-CM | POA: Diagnosis not present

## 2019-12-23 DIAGNOSIS — N281 Cyst of kidney, acquired: Secondary | ICD-10-CM | POA: Diagnosis not present

## 2020-01-03 ENCOUNTER — Encounter: Payer: Self-pay | Admitting: Gastroenterology

## 2020-01-05 DIAGNOSIS — H16222 Keratoconjunctivitis sicca, not specified as Sjogren's, left eye: Secondary | ICD-10-CM | POA: Diagnosis not present

## 2020-01-05 DIAGNOSIS — H524 Presbyopia: Secondary | ICD-10-CM | POA: Diagnosis not present

## 2020-01-05 DIAGNOSIS — H2513 Age-related nuclear cataract, bilateral: Secondary | ICD-10-CM | POA: Diagnosis not present

## 2020-01-05 DIAGNOSIS — H35341 Macular cyst, hole, or pseudohole, right eye: Secondary | ICD-10-CM | POA: Diagnosis not present

## 2020-01-05 DIAGNOSIS — H40013 Open angle with borderline findings, low risk, bilateral: Secondary | ICD-10-CM | POA: Diagnosis not present

## 2020-01-20 ENCOUNTER — Ambulatory Visit (INDEPENDENT_AMBULATORY_CARE_PROVIDER_SITE_OTHER): Payer: Medicare Other | Admitting: Cardiology

## 2020-01-20 ENCOUNTER — Encounter: Payer: Self-pay | Admitting: Cardiology

## 2020-01-20 ENCOUNTER — Other Ambulatory Visit: Payer: Self-pay

## 2020-01-20 VITALS — BP 138/73 | HR 73 | Ht 71.0 in | Wt 161.0 lb

## 2020-01-20 DIAGNOSIS — R0602 Shortness of breath: Secondary | ICD-10-CM | POA: Diagnosis not present

## 2020-01-20 DIAGNOSIS — I5023 Acute on chronic systolic (congestive) heart failure: Secondary | ICD-10-CM | POA: Diagnosis not present

## 2020-01-20 NOTE — Progress Notes (Signed)
Clinical Summary Ronnie [name] is a 74 y.o.male seen today for follow up of the following medical problems.This is a focused visit on recent weight gain and edema.    1. Chronic systolic HF - records from 2014 indicate admission with acute HF. Echo at that time showed VLEF 35-40%, grade II diastolic dysfunction - he refused cath at that time 05/2018 echo LVEF 25-30%, diffuse hypokinesis, grade II diastolic dysfunction. Moderate MR, moderate RV dysfunction   07/2019 echo LVEF 09%, grade I diastolic dysfunction. Normal RV function.  - no LE edema. Ongoing SOB.  - he reports compliance with meds, [facility] has been helping maintain compliance at home    - recent issues with weight gain and edema. Toresmide was increased to 40mg  in AM and 20mg  in Pm - home weight 158 yesterday - +abdominal distension. Some SOB.     Past Medical History:  Diagnosis Date  . Chronic combined systolic (congestive) and diastolic (congestive) heart failure (HCC)    a. EF 35 to 40% by echocardiogram in 2014 --> declined cath at that time and did not reestablish care with Cardiology until 04/2018  . Dyslipidemia   . Hypertension   . Shortness of breath      Allergies  Allergen Reactions  . Bee Venom Shortness Of Breath and Swelling     Current Outpatient Medications  Medication Sig Dispense Refill  . amLODipine (NORVASC) 5 MG tablet Take 5 mg by mouth daily.    Marland Kitchen aspirin EC 81 MG EC tablet Take 1 tablet (81 mg total) by mouth daily.    . carvedilol (COREG) 25 MG tablet Take 25 mg by mouth 2 (two) times daily with a meal.    . citalopram (CELEXA) 20 MG tablet Take 20 mg by mouth daily.    . fluticasone (FLONASE) 50 MCG/ACT nasal spray Place 1 spray into both nostrils daily as needed for allergies or rhinitis.    . hydrALAZINE (APRESOLINE) 25 MG tablet Take 25 mg by mouth 2 (two) times daily.    Marland Kitchen ipratropium-albuterol (DUONEB) 0.5-2.5 (3) MG/3ML SOLN Take 3 mLs by nebulization every 6 (six)  hours as needed (shortness of breath).    . isosorbide mononitrate (IMDUR) 30 MG 24 hr tablet Take 30 mg by mouth daily.   0  . Multiple Vitamin (MULTIVITAMIN WITH MINERALS) TABS tablet Take 1 tablet by mouth daily.    . Nutritional Supplements (NUTRITIONAL DRINK SHAKE MIX PO) Take 1 Bottle by mouth daily.    . pantoprazole (PROTONIX) 40 MG tablet Take 40 mg by mouth daily. Taking PRN due to confusion with direction    . potassium chloride SA (K-DUR,KLOR-CON) 20 MEQ tablet Take 20 mEq by mouth 2 (two) times daily.     . simvastatin (ZOCOR) 20 MG tablet Take 20 mg by mouth every evening.     . torsemide (DEMADEX) 20 MG tablet Take 2 tablets (40 mg total) by mouth every morning. & 20 mg in the late afternoon 90 tablet 1   No current facility-administered medications for this visit.     Past Surgical History:  Procedure Laterality Date  . BIOPSY  05/28/2018   Procedure: BIOPSY;  Surgeon: Danie Binder, MD;  [facility];  Service: Endoscopy;;  duodenal biopsy , gastric biopsy  . DENTAL SURGERY    . ESOPHAGOGASTRODUODENOSCOPY (EGD) WITH PROPOFOL N/A 05/28/2018   Procedure: ESOPHAGOGASTRODUODENOSCOPY (EGD) WITH PROPOFOL;  Surgeon: Danie Binder, MD;  [facility];  Service: Endoscopy;  Laterality: N/A;  7:30am  . SAVORY DILATION N/A 05/28/2018   Procedure: SAVORY DILATION;  Surgeon: Danie Binder, MD;  [facility]: [facility];  Service: Endoscopy;  Laterality: N/A;     Allergies  Allergen Reactions  . Bee Venom Shortness Of Breath and Swelling      Family History  Problem Relation Age of Onset  . Heart disease Mother   . Stomach cancer Father   . Cervical cancer Sister   . Prostate cancer Brother   . Colon cancer Neg Hx   . Colon polyps Neg Hx      Social History Ronnie [name] reports that he has been smoking cigarettes. He has a 25.00 pack-year smoking history. He has never used smokeless tobacco. Ronnie [name] reports no history of alcohol  use.   Review of Systems CONSTITUTIONAL: No weight loss, fever, chills, weakness or fatigue.  HEENT: Eyes: No visual loss, blurred vision, double vision or yellow sclerae.No hearing loss, sneezing, congestion, runny nose or sore throat.  SKIN: No rash or itching.  CARDIOVASCULAR: per hpi RESPIRATORY: per hpi GASTROINTESTINAL: No anorexia, nausea, vomiting or diarrhea. No abdominal pain or blood.  GENITOURINARY: No burning on urination, no polyuria NEUROLOGICAL: No headache, dizziness, syncope, paralysis, ataxia, numbness or tingling in the extremities. No change in bowel or bladder control.  MUSCULOSKELETAL: No muscle, back pain, joint pain or stiffness.  LYMPHATICS: No enlarged nodes. No history of splenectomy.  PSYCHIATRIC: No history of depression or anxiety.  ENDOCRINOLOGIC: No reports of sweating, cold or heat intolerance. No polyuria or polydipsia.  Marland Kitchen   Physical Examination Today's Vitals   01/20/20 1348  BP: 138/73  Pulse: 73  SpO2: 98%  Weight: 161 lb (73 kg)  Height: 5\' 11"  (1.803 m)   Body mass index is 22.45 kg/m.  Gen: resting comfortably, no acute distress HEENT: no scleral icterus, pupils equal round and reactive, no palptable cervical adenopathy,  CV: RRR, no m/r/g, no jvd Resp: Clear to auscultation bilaterally GI: abdomen is soft, non-tender, non-distended, normal bowel sounds, no hepatosplenomegaly MSK: extremities are warm, no edema.  Skin: warm, no rash Neuro:  no focal deficits Psych: appropriate affect   Diagnostic Studies 06/2013 echo Study Conclusions  - Left ventricle: The cavity size was at the upper limits of normal. Wall thickness was increased in a pattern of mild LVH. There was mild concentric hypertrophy. Systolic function was moderately reduced. The estimated ejection fraction was in the range of 35% to 40%. Diffuse hypokinesis. Features are consistent with a pseudonormal left ventricular filling pattern, with  concomitant abnormal relaxation and increased filling pressure (grade 2 diastolic dysfunction). Doppler parameters are consistent with elevated mean left atrial filling pressure. - Aortic valve: Trileaflet; mildly thickened leaflets. - Mitral valve: Mild to moderate regurgitation. - Right ventricle: Systolic pressure was increased. - Atrial septum: No defect or patent foramen ovale was identified. - Pulmonary arteries: PA peak pressure: 46mm Hg (S). Impressions:  05/2018 echo Study Conclusions  - Left ventricle: The cavity size was normal. Wall thickness was increased in a pattern of mild LVH. Systolic function was severely reduced. The estimated ejection fraction was in the range of 25% to 30%. Diffuse hypokinesis. Features are consistent with a pseudonormal left ventricular filling pattern, with concomitant abnormal relaxation and increased filling pressure (grade 2 diastolic dysfunction). Doppler parameters are consistent with indeterminate ventricular filling pressure. - Aortic valve: Mildly calcified annulus. Trileaflet. There was mild regurgitation. - Mitral valve: There was moderate regurgitation. - Left atrium: The atrium was  moderately dilated. - Right ventricle: Systolic function was moderately reduced. - Tricuspid valve: There was mild regurgitation. - Pulmonary arteries: PA peak pressure: 32 mm Hg (S).  07/2019 echo IMPRESSIONS   1. Left ventricular ejection fraction, by visual estimation, is 55%. The left ventricle has normal function. Normal left ventricular size. There is moderately increased left ventricular hypertrophy. 2. Basal and mid inferolateral wall and basal inferior segment are abnormal. 3. Left ventricular diastolic Doppler parameters are consistent with impaired relaxation pattern of LV diastolic filling. 4. Global right ventricle has normal systolic function.The right ventricular size is normal. No increase in right  ventricular wall thickness. 5. Left atrial size was normal. 6. Right atrial size was normal. 7. Mild aortic valve annular calcification. 8. The mitral valve is grossly normal. Mild mitral valve regurgitation. 9. The tricuspid valve is grossly normal. Tricuspid valve regurgitation is mild. 10. The aortic valve is tricuspid Aortic valve regurgitation is mild by color flow Doppler. Mild aortic valve sclerosis without stenosis. 11. There is Mild thickening of the aortic valve. 12. The pulmonic valve was grossly normal. Pulmonic valve regurgitation is not visualized by color flow Doppler. 13. The inferior vena cava is normal in size with greater than 50% respiratory variability, suggesting right atrial pressure of 3 mmHg.    Assessment and Plan  1. Acute on chronic sysotlic HF - medical therapy limited due to significant orthostatic symptoms and poor compliance - by last echo his LVEF has actually normalized. - some recent weight gain, primarily abdominal distension and SOB. No significant LE edema.    - obtain CMET/Mg/TSH/BNP. Pending results may titrate up his torsemide further.    F/u 2 months   Arnoldo Lenis, M.D.

## 2020-01-20 NOTE — Patient Instructions (Signed)
Your physician recommends that you schedule a follow-up appointment in: 2 [place]  Your physician recommends that you continue on your current medications as directed. Please refer to the Current Medication list given to you today.  Your physician recommends that you return for lab work CMP/TSH/MG/BNP  Thank you for choosing [facility]!!

## 2020-02-03 ENCOUNTER — Telehealth: Payer: Self-pay | Admitting: *Deleted

## 2020-02-03 ENCOUNTER — Ambulatory Visit: Payer: Medicare Other | Admitting: Gastroenterology

## 2020-02-03 NOTE — Telephone Encounter (Signed)
need lab orders faxed to [phone]

## 2020-02-23 ENCOUNTER — Ambulatory Visit: Payer: Medicare Other | Admitting: Gastroenterology

## 2020-03-20 ENCOUNTER — Other Ambulatory Visit: Payer: Self-pay

## 2020-03-20 ENCOUNTER — Encounter: Payer: Self-pay | Admitting: *Deleted

## 2020-03-20 ENCOUNTER — Ambulatory Visit (INDEPENDENT_AMBULATORY_CARE_PROVIDER_SITE_OTHER): Payer: Medicare Other | Admitting: Cardiology

## 2020-03-20 ENCOUNTER — Encounter: Payer: Self-pay | Admitting: Cardiology

## 2020-03-20 VITALS — BP 110/60 | HR 74 | Ht 71.0 in | Wt 158.6 lb

## 2020-03-20 DIAGNOSIS — I5022 Chronic systolic (congestive) heart failure: Secondary | ICD-10-CM

## 2020-03-20 DIAGNOSIS — R0602 Shortness of breath: Secondary | ICD-10-CM

## 2020-03-20 DIAGNOSIS — I10 Essential (primary) hypertension: Secondary | ICD-10-CM

## 2020-03-20 NOTE — Patient Instructions (Signed)
Your physician wants you to follow-up in: [place] will receive a reminder letter in the mail two months in advance. If you don't receive a letter, please call our office to schedule the follow-up appointment.  Your physician recommends that you continue on your current medications as directed. Please refer to the Current Medication list given to you today.  Your physician has recommended that you have a pulmonary function test. Pulmonary Function Tests are a group of tests that measure how well air moves in and out of your lungs.  Thank you for choosing [facility]!!

## 2020-03-20 NOTE — Progress Notes (Signed)
Clinical Summary Ronnie [name] is a 74 y.o.male seen today for follow up of the following medical problems.   1. Chronic systolic HF - records from 2014 indicate admission with acute HF. Echo at that time showed VLEF 35-40%, grade II diastolic dysfunction - he refused cath at that time 05/2018 echo LVEF 25-30%, diffuse hypokinesis, grade II diastolic dysfunction. Moderate MR, moderate RV dysfunction   07/2019 echo LVEF 10%, grade I diastolic dysfunction. Normal RV function. - no LE edema. Ongoing SOB.  - he reports compliance with meds, [facility] has been helping maintain complianceat home    - weights are stable, no significant LE edema - compliant with diuretic.    2. TIA - admitted to [facility] 02/2020   3. HTN - compliant with meds  4. CKD - followed by pcp, he did not establish with nephrology   5. Former smoker - over 34 years. +cough +wheezing.  - has nebulizer. He is not sure if he has any other inhalers. Not taking any inhalers regularly  - ongong SOB. +cough, +wheezing.  - we had planned for PFTs in December but not completed due to covid    Past Medical History:  Diagnosis Date  . Chronic combined systolic (congestive) and diastolic (congestive) heart failure (HCC)    a. EF 35 to 40% by echocardiogram in 2014 --> declined cath at that time and did not reestablish care with Cardiology until 04/2018  . Dyslipidemia   . Hypertension   . Shortness of breath      Allergies  Allergen Reactions  . Bee Venom Shortness Of Breath and Swelling     Current Outpatient Medications  Medication Sig Dispense Refill  . amLODipine (NORVASC) 5 MG tablet Take 5 mg by mouth daily.    Marland Kitchen aspirin EC 81 MG EC tablet Take 1 tablet (81 mg total) by mouth daily.    . carvedilol (COREG) 25 MG tablet Take 25 mg by mouth 2 (two) times daily with a meal.    . citalopram (CELEXA) 20 MG tablet Take 20 mg by mouth daily.    . fluticasone (FLONASE) 50 MCG/ACT nasal spray  Place 1 spray into both nostrils daily as needed for allergies or rhinitis.    Marland Kitchen gabapentin (NEURONTIN) 100 MG capsule Take 100 mg by mouth 3 (three) times daily.    . hydrALAZINE (APRESOLINE) 25 MG tablet Take 25 mg by mouth 2 (two) times daily.    Marland Kitchen ipratropium-albuterol (DUONEB) 0.5-2.5 (3) MG/3ML SOLN Take 3 mLs by nebulization every 6 (six) hours as needed (shortness of breath).    . isosorbide mononitrate (IMDUR) 30 MG 24 hr tablet Take 30 mg by mouth daily.   0  . Multiple Vitamin (MULTIVITAMIN WITH MINERALS) TABS tablet Take 1 tablet by mouth daily.    . Nutritional Supplements (NUTRITIONAL DRINK SHAKE MIX PO) Take 1 Bottle by mouth daily.    . pantoprazole (PROTONIX) 40 MG tablet Take 40 mg by mouth daily. Taking PRN due to confusion with direction    . potassium chloride SA (K-DUR,KLOR-CON) 20 MEQ tablet Take 20 mEq by mouth 2 (two) times daily.     . QUEtiapine (SEROQUEL) 25 MG tablet Take 25 mg by mouth at bedtime.    . simvastatin (ZOCOR) 20 MG tablet Take 20 mg by mouth every evening.     . torsemide (DEMADEX) 20 MG tablet Take 2 tablets (40 mg total) by mouth every morning. & 20 mg in the late afternoon 90 tablet  1   No current facility-administered medications for this visit.     Past Surgical History:  Procedure Laterality Date  . BIOPSY  05/28/2018   Procedure: BIOPSY;  Surgeon: Danie Binder, MD;  [facility];  Service: Endoscopy;;  duodenal biopsy , gastric biopsy  . DENTAL SURGERY    . ESOPHAGOGASTRODUODENOSCOPY (EGD) WITH PROPOFOL N/A 05/28/2018   Procedure: ESOPHAGOGASTRODUODENOSCOPY (EGD) WITH PROPOFOL;  Surgeon: Danie Binder, MD;  [facility];  Service: Endoscopy;  Laterality: N/A;  7:30am  . SAVORY DILATION N/A 05/28/2018   Procedure: SAVORY DILATION;  Surgeon: Danie Binder, MD;  [facility];  Service: Endoscopy;  Laterality: N/A;     Allergies  Allergen Reactions  . Bee Venom Shortness Of Breath and Swelling       Family History  Problem Relation Age of Onset  . Heart disease Mother   . Stomach cancer Father   . Cervical cancer Sister   . Prostate cancer Brother   . Colon cancer Neg Hx   . Colon polyps Neg Hx      Social History Ronnie [name] reports that he has been smoking cigarettes. He has a 25.00 pack-year smoking history. He has never used smokeless tobacco. Ronnie [name] reports no history of alcohol use.   Review of Systems CONSTITUTIONAL: No weight loss, fever, chills, weakness or fatigue.  HEENT: Eyes: No visual loss, blurred vision, double vision or yellow sclerae.No hearing loss, sneezing, congestion, runny nose or sore throat.  SKIN: No rash or itching.  CARDIOVASCULAR: per hpi RESPIRATORY: No shortness of breath, cough or sputum.  GASTROINTESTINAL: No anorexia, nausea, vomiting or diarrhea. No abdominal pain or blood.  GENITOURINARY: No burning on urination, no polyuria NEUROLOGICAL: No headache, dizziness, syncope, paralysis, ataxia, numbness or tingling in the extremities. No change in bowel or bladder control.  MUSCULOSKELETAL: No muscle, back pain, joint pain or stiffness.  LYMPHATICS: No enlarged nodes. No history of splenectomy.  PSYCHIATRIC: No history of depression or anxiety.  ENDOCRINOLOGIC: No reports of sweating, cold or heat intolerance. No polyuria or polydipsia.  Marland Kitchen   Physical Examination Today's Vitals   03/20/20 1131  BP: 110/60  Pulse: 74  SpO2: 98%  Weight: 158 lb 9.6 oz (71.9 kg)  Height: 5\' 11"  (1.803 m)   Body mass index is 22.12 kg/m.  Gen: resting comfortably, no acute distress HEENT: no scleral icterus, pupils equal round and reactive, no palptable cervical adenopathy,  CV: RRR, no m/r/g, no jvd Resp: Clear to auscultation bilaterally GI: abdomen is soft, non-tender, non-distended, normal bowel sounds, no hepatosplenomegaly MSK: extremities are warm, no edema.  Skin: warm, no rash Neuro:  no focal deficits Psych: appropriate  affect   Diagnostic Studies 06/2013 echo Study Conclusions  - Left ventricle: The cavity size was at the upper limits of normal. Wall thickness was increased in a pattern of mild LVH. There was mild concentric hypertrophy. Systolic function was moderately reduced. The estimated ejection fraction was in the range of 35% to 40%. Diffuse hypokinesis. Features are consistent with a pseudonormal left ventricular filling pattern, with concomitant abnormal relaxation and increased filling pressure (grade 2 diastolic dysfunction). Doppler parameters are consistent with elevated mean left atrial filling pressure. - Aortic valve: Trileaflet; mildly thickened leaflets. - Mitral valve: Mild to moderate regurgitation. - Right ventricle: Systolic pressure was increased. - Atrial septum: No defect or patent foramen ovale was identified. - Pulmonary arteries: PA peak pressure: 67mm Hg (S). Impressions:  05/2018 echo Study  Conclusions  - Left ventricle: The cavity size was normal. Wall thickness was increased in a pattern of mild LVH. Systolic function was severely reduced. The estimated ejection fraction was in the range of 25% to 30%. Diffuse hypokinesis. Features are consistent with a pseudonormal left ventricular filling pattern, with concomitant abnormal relaxation and increased filling pressure (grade 2 diastolic dysfunction). Doppler parameters are consistent with indeterminate ventricular filling pressure. - Aortic valve: Mildly calcified annulus. Trileaflet. There was mild regurgitation. - Mitral valve: There was moderate regurgitation. - Left atrium: The atrium was moderately dilated. - Right ventricle: Systolic function was moderately reduced. - Tricuspid valve: There was mild regurgitation. - Pulmonary arteries: PA peak pressure: 32 mm Hg (S).  07/2019 echo IMPRESSIONS   1. Left ventricular ejection fraction, by visual estimation,  is 55%. The left ventricle has normal function. Normal left ventricular size. There is moderately increased left ventricular hypertrophy. 2. Basal and mid inferolateral wall and basal inferior segment are abnormal. 3. Left ventricular diastolic Doppler parameters are consistent with impaired relaxation pattern of LV diastolic filling. 4. Global right ventricle has normal systolic function.The right ventricular size is normal. No increase in right ventricular wall thickness. 5. Left atrial size was normal. 6. Right atrial size was normal. 7. Mild aortic valve annular calcification. 8. The mitral valve is grossly normal. Mild mitral valve regurgitation. 9. The tricuspid valve is grossly normal. Tricuspid valve regurgitation is mild. 10. The aortic valve is tricuspid Aortic valve regurgitation is mild by color flow Doppler. Mild aortic valve sclerosis without stenosis. 11. There is Mild thickening of the aortic valve. 12. The pulmonic valve was grossly normal. Pulmonic valve regurgitation is not visualized by color flow Doppler. 13. The inferior vena cava is normal in size with greater than 50% respiratory variability, suggesting right atrial pressure of 3 mmHg.    Assessment and Plan  1. Chronic sysotlic HF - medical therapy limited due to significant orthostatic symptomsand poor compliance - by last echo his LVEF has actually normalized. - fluid status is stable, continue current meds. Renal function stable by recent hospital labs   2. SOB - likely related to long smoking history - obtain PFTs   3. HTN - at goal, continue current meds       Arnoldo Lenis, M.D.

## 2020-03-24 ENCOUNTER — Ambulatory Visit: Payer: Medicare Other | Admitting: Cardiology

## 2020-05-08 DIAGNOSIS — R5081 Fever presenting with conditions classified elsewhere: Secondary | ICD-10-CM | POA: Diagnosis not present

## 2020-05-08 DIAGNOSIS — K219 Gastro-esophageal reflux disease without esophagitis: Secondary | ICD-10-CM | POA: Diagnosis not present

## 2020-05-08 DIAGNOSIS — I130 Hypertensive heart and chronic kidney disease with heart failure and stage 1 through stage 4 chronic kidney disease, or unspecified chronic kidney disease: Secondary | ICD-10-CM | POA: Diagnosis not present

## 2020-05-08 DIAGNOSIS — Z20822 Contact with and (suspected) exposure to covid-19: Secondary | ICD-10-CM | POA: Diagnosis not present

## 2020-05-08 DIAGNOSIS — R0602 Shortness of breath: Secondary | ICD-10-CM | POA: Diagnosis not present

## 2020-05-08 DIAGNOSIS — C250 Malignant neoplasm of head of pancreas: Secondary | ICD-10-CM | POA: Diagnosis not present

## 2020-05-08 DIAGNOSIS — N179 Acute kidney failure, unspecified: Secondary | ICD-10-CM | POA: Diagnosis not present

## 2020-05-08 DIAGNOSIS — R1310 Dysphagia, unspecified: Secondary | ICD-10-CM | POA: Diagnosis not present

## 2020-05-08 DIAGNOSIS — K5900 Constipation, unspecified: Secondary | ICD-10-CM | POA: Diagnosis not present

## 2020-05-08 DIAGNOSIS — K838 Other specified diseases of biliary tract: Secondary | ICD-10-CM | POA: Diagnosis not present

## 2020-05-08 DIAGNOSIS — I700 Atherosclerosis of aorta: Secondary | ICD-10-CM | POA: Diagnosis not present

## 2020-05-08 DIAGNOSIS — Z8673 Personal history of transient ischemic attack (TIA), and cerebral infarction without residual deficits: Secondary | ICD-10-CM | POA: Diagnosis not present

## 2020-05-08 DIAGNOSIS — Z7982 Long term (current) use of aspirin: Secondary | ICD-10-CM | POA: Diagnosis not present

## 2020-05-08 DIAGNOSIS — R5381 Other malaise: Secondary | ICD-10-CM | POA: Diagnosis not present

## 2020-05-08 DIAGNOSIS — K8689 Other specified diseases of pancreas: Secondary | ICD-10-CM | POA: Diagnosis not present

## 2020-05-08 DIAGNOSIS — E785 Hyperlipidemia, unspecified: Secondary | ICD-10-CM | POA: Diagnosis not present

## 2020-05-08 DIAGNOSIS — J449 Chronic obstructive pulmonary disease, unspecified: Secondary | ICD-10-CM | POA: Diagnosis not present

## 2020-05-08 DIAGNOSIS — I5042 Chronic combined systolic (congestive) and diastolic (congestive) heart failure: Secondary | ICD-10-CM | POA: Diagnosis not present

## 2020-05-08 DIAGNOSIS — J90 Pleural effusion, not elsewhere classified: Secondary | ICD-10-CM | POA: Diagnosis not present

## 2020-05-08 DIAGNOSIS — I714 Abdominal aortic aneurysm, without rupture: Secondary | ICD-10-CM | POA: Diagnosis not present

## 2020-05-08 DIAGNOSIS — E860 Dehydration: Secondary | ICD-10-CM | POA: Diagnosis not present

## 2020-05-08 DIAGNOSIS — D72829 Elevated white blood cell count, unspecified: Secondary | ICD-10-CM | POA: Diagnosis not present

## 2020-05-08 DIAGNOSIS — R790 Abnormal level of blood mineral: Secondary | ICD-10-CM | POA: Diagnosis not present

## 2020-05-08 DIAGNOSIS — N183 Chronic kidney disease, stage 3 unspecified: Secondary | ICD-10-CM | POA: Diagnosis not present

## 2020-05-08 DIAGNOSIS — I10 Essential (primary) hypertension: Secondary | ICD-10-CM | POA: Diagnosis not present

## 2020-05-08 DIAGNOSIS — R638 Other symptoms and signs concerning food and fluid intake: Secondary | ICD-10-CM | POA: Diagnosis not present

## 2020-05-08 DIAGNOSIS — R296 Repeated falls: Secondary | ICD-10-CM | POA: Diagnosis not present

## 2020-05-08 DIAGNOSIS — D638 Anemia in other chronic diseases classified elsewhere: Secondary | ICD-10-CM | POA: Diagnosis not present

## 2020-05-08 DIAGNOSIS — R079 Chest pain, unspecified: Secondary | ICD-10-CM | POA: Diagnosis not present

## 2020-05-08 DIAGNOSIS — C259 Malignant neoplasm of pancreas, unspecified: Secondary | ICD-10-CM | POA: Diagnosis not present

## 2020-05-08 DIAGNOSIS — R7881 Bacteremia: Secondary | ICD-10-CM | POA: Diagnosis not present

## 2020-05-08 DIAGNOSIS — N189 Chronic kidney disease, unspecified: Secondary | ICD-10-CM | POA: Diagnosis not present

## 2020-05-08 DIAGNOSIS — E441 Mild protein-calorie malnutrition: Secondary | ICD-10-CM | POA: Diagnosis not present

## 2020-05-08 DIAGNOSIS — D649 Anemia, unspecified: Secondary | ICD-10-CM | POA: Diagnosis not present

## 2020-05-11 DIAGNOSIS — R0602 Shortness of breath: Secondary | ICD-10-CM | POA: Diagnosis not present

## 2020-05-11 DIAGNOSIS — I714 Abdominal aortic aneurysm, without rupture: Secondary | ICD-10-CM | POA: Diagnosis not present

## 2020-05-11 DIAGNOSIS — K838 Other specified diseases of biliary tract: Secondary | ICD-10-CM | POA: Diagnosis not present

## 2020-05-11 DIAGNOSIS — J90 Pleural effusion, not elsewhere classified: Secondary | ICD-10-CM | POA: Diagnosis not present

## 2020-05-11 DIAGNOSIS — K8689 Other specified diseases of pancreas: Secondary | ICD-10-CM | POA: Diagnosis not present

## 2020-05-11 DIAGNOSIS — I700 Atherosclerosis of aorta: Secondary | ICD-10-CM | POA: Diagnosis not present

## 2020-05-23 DIAGNOSIS — L110 Acquired keratosis follicularis: Secondary | ICD-10-CM | POA: Diagnosis not present

## 2020-05-23 DIAGNOSIS — M79672 Pain in left foot: Secondary | ICD-10-CM | POA: Diagnosis not present

## 2020-05-23 DIAGNOSIS — M79674 Pain in right toe(s): Secondary | ICD-10-CM | POA: Diagnosis not present

## 2020-05-23 DIAGNOSIS — M79671 Pain in right foot: Secondary | ICD-10-CM | POA: Diagnosis not present

## 2020-05-23 DIAGNOSIS — I739 Peripheral vascular disease, unspecified: Secondary | ICD-10-CM | POA: Diagnosis not present

## 2020-05-24 ENCOUNTER — Encounter (HOSPITAL_COMMUNITY): Payer: Self-pay

## 2020-05-24 DIAGNOSIS — N183 Chronic kidney disease, stage 3 unspecified: Secondary | ICD-10-CM | POA: Diagnosis not present

## 2020-05-24 DIAGNOSIS — E860 Dehydration: Secondary | ICD-10-CM | POA: Diagnosis not present

## 2020-05-24 DIAGNOSIS — K5900 Constipation, unspecified: Secondary | ICD-10-CM | POA: Diagnosis not present

## 2020-05-24 DIAGNOSIS — C250 Malignant neoplasm of head of pancreas: Secondary | ICD-10-CM | POA: Diagnosis not present

## 2020-05-24 NOTE — Progress Notes (Signed)
I placed an introductory phone call to this patient. I introduced myself and explained my role in the patient's care. I provided my contact information should the patient have any needs arise. I briefly explained what the patient can expect tomorrow. The patient denies any current barriers but does express that he heavily relies on [facility] for transportation. The patient goes on to explain that [facility] prefers a 3 day notice in order to transport and requests that he be given at least that for upcoming appts. Patient states that he has two relatives who can often provide transportation when [facility] is not an option. I went on to discuss genetic counseling with the patient. Patient reports his father had pancreatic cancer and his sister had ovarian cancer. Patient agrees to move forward with genetic lab work tomorrow during his initial visit. I explained to the patient that I will meet with him tomorrow during his initial visit with Dr. Delton Coombes.

## 2020-05-25 ENCOUNTER — Inpatient Hospital Stay (HOSPITAL_COMMUNITY): Payer: Medicare Other | Admitting: Hematology

## 2020-05-25 ENCOUNTER — Encounter (HOSPITAL_COMMUNITY): Payer: Self-pay

## 2020-05-25 NOTE — Progress Notes (Signed)
Patient's wife called the clinic and asked for information on who referred patient to this clinic. I was unable to reach patient's wife but I was able to reach the patient. I explained that Oncology with [facility] has referred him to our clinic for continued oncology care. Patient goes on to tell me that his primary care doctor has referred him to [facility]. Patient has decided to cancel his appt with Dr. Delton Coombes today.

## 2020-05-25 NOTE — Progress Notes (Deleted)
Patient's wife called the clinic and asked for information on who referred patient to this clinic. I was unable to reach patient's wife but I was able to reach the patient. I explained that Oncology with [facility] has referred him to our clinic for continued oncology care. Patient goes on to tell me that his primary care doctor has referred him to [facility]. Patient has decided to cancel his appt with Dr. Delton Coombes today.

## 2020-05-30 DIAGNOSIS — K831 Obstruction of bile duct: Secondary | ICD-10-CM | POA: Diagnosis not present

## 2020-05-30 DIAGNOSIS — E440 Moderate protein-calorie malnutrition: Secondary | ICD-10-CM | POA: Diagnosis not present

## 2020-05-30 DIAGNOSIS — I5032 Chronic diastolic (congestive) heart failure: Secondary | ICD-10-CM | POA: Diagnosis not present

## 2020-05-30 DIAGNOSIS — C801 Malignant (primary) neoplasm, unspecified: Secondary | ICD-10-CM | POA: Diagnosis not present

## 2020-05-30 DIAGNOSIS — C250 Malignant neoplasm of head of pancreas: Secondary | ICD-10-CM | POA: Diagnosis not present

## 2020-05-30 DIAGNOSIS — G893 Neoplasm related pain (acute) (chronic): Secondary | ICD-10-CM | POA: Diagnosis not present

## 2020-05-30 DIAGNOSIS — N183 Chronic kidney disease, stage 3 unspecified: Secondary | ICD-10-CM | POA: Diagnosis not present

## 2020-07-06 DIAGNOSIS — Z743 Need for continuous supervision: Secondary | ICD-10-CM | POA: Diagnosis not present

## 2020-07-06 DIAGNOSIS — R109 Unspecified abdominal pain: Secondary | ICD-10-CM | POA: Diagnosis not present

## 2020-07-06 DIAGNOSIS — I130 Hypertensive heart and chronic kidney disease with heart failure and stage 1 through stage 4 chronic kidney disease, or unspecified chronic kidney disease: Secondary | ICD-10-CM | POA: Diagnosis not present

## 2020-07-06 DIAGNOSIS — D649 Anemia, unspecified: Secondary | ICD-10-CM | POA: Diagnosis not present

## 2020-07-06 DIAGNOSIS — N179 Acute kidney failure, unspecified: Secondary | ICD-10-CM | POA: Diagnosis not present

## 2020-07-06 DIAGNOSIS — Z8673 Personal history of transient ischemic attack (TIA), and cerebral infarction without residual deficits: Secondary | ICD-10-CM | POA: Diagnosis not present

## 2020-07-06 DIAGNOSIS — J449 Chronic obstructive pulmonary disease, unspecified: Secondary | ICD-10-CM | POA: Diagnosis not present

## 2020-07-06 DIAGNOSIS — E1165 Type 2 diabetes mellitus with hyperglycemia: Secondary | ICD-10-CM | POA: Diagnosis not present

## 2020-07-06 DIAGNOSIS — Z66 Do not resuscitate: Secondary | ICD-10-CM | POA: Diagnosis not present

## 2020-07-06 DIAGNOSIS — Z7982 Long term (current) use of aspirin: Secondary | ICD-10-CM | POA: Diagnosis not present

## 2020-07-06 DIAGNOSIS — C259 Malignant neoplasm of pancreas, unspecified: Secondary | ICD-10-CM | POA: Diagnosis not present

## 2020-07-06 DIAGNOSIS — E785 Hyperlipidemia, unspecified: Secondary | ICD-10-CM | POA: Diagnosis not present

## 2020-07-06 DIAGNOSIS — R4020 Unspecified coma: Secondary | ICD-10-CM | POA: Diagnosis not present

## 2020-07-06 DIAGNOSIS — N189 Chronic kidney disease, unspecified: Secondary | ICD-10-CM | POA: Diagnosis not present

## 2020-07-06 DIAGNOSIS — R6889 Other general symptoms and signs: Secondary | ICD-10-CM | POA: Diagnosis not present

## 2020-07-06 DIAGNOSIS — I5042 Chronic combined systolic (congestive) and diastolic (congestive) heart failure: Secondary | ICD-10-CM | POA: Diagnosis not present

## 2020-07-06 DIAGNOSIS — Z79899 Other long term (current) drug therapy: Secondary | ICD-10-CM | POA: Diagnosis not present

## 2020-07-06 DIAGNOSIS — F17210 Nicotine dependence, cigarettes, uncomplicated: Secondary | ICD-10-CM | POA: Diagnosis not present

## 2020-07-06 DIAGNOSIS — G893 Neoplasm related pain (acute) (chronic): Secondary | ICD-10-CM | POA: Diagnosis not present

## 2020-07-06 DIAGNOSIS — K219 Gastro-esophageal reflux disease without esophagitis: Secondary | ICD-10-CM | POA: Diagnosis not present

## 2020-07-06 DIAGNOSIS — C250 Malignant neoplasm of head of pancreas: Secondary | ICD-10-CM | POA: Diagnosis not present

## 2020-07-06 DIAGNOSIS — G929 Unspecified toxic encephalopathy: Secondary | ICD-10-CM | POA: Diagnosis not present

## 2020-07-06 DIAGNOSIS — R531 Weakness: Secondary | ICD-10-CM | POA: Diagnosis not present

## 2020-07-07 DIAGNOSIS — C250 Malignant neoplasm of head of pancreas: Secondary | ICD-10-CM | POA: Diagnosis not present

## 2020-07-07 DIAGNOSIS — N179 Acute kidney failure, unspecified: Secondary | ICD-10-CM | POA: Diagnosis not present

## 2020-09-06 DEATH — deceased

## 2020-09-08 ENCOUNTER — Ambulatory Visit: Payer: Medicare Other | Admitting: Cardiology
